# Patient Record
Sex: Female | Born: 1970 | Race: White | Hispanic: No | Smoking: Current every day smoker
Health system: Southern US, Community
[De-identification: ages and names within clinical notes are randomized; demographics above are authoritative.]

## PROBLEM LIST (undated history)

## (undated) DIAGNOSIS — J45909 Unspecified asthma, uncomplicated: Secondary | ICD-10-CM

## (undated) DIAGNOSIS — F419 Anxiety disorder, unspecified: Secondary | ICD-10-CM

## (undated) DIAGNOSIS — T7840XA Allergy, unspecified, initial encounter: Secondary | ICD-10-CM

## (undated) HISTORY — PX: BREAST ENHANCEMENT SURGERY: SHX7

## (undated) HISTORY — DX: Anxiety disorder, unspecified: F41.9

## (undated) HISTORY — PX: NECK SURGERY: SHX720

## (undated) HISTORY — DX: Unspecified asthma, uncomplicated: J45.909

## (undated) HISTORY — PX: ANKLE SURGERY: SHX546

## (undated) HISTORY — PX: TONSILLECTOMY: SUR1361

## (undated) HISTORY — DX: Allergy, unspecified, initial encounter: T78.40XA

---

## 1997-01-20 HISTORY — PX: ANKLE SURGERY: SHX546

## 1997-06-04 ENCOUNTER — Inpatient Hospital Stay (HOSPITAL_COMMUNITY): Admission: AD | Admit: 1997-06-04 | Discharge: 1997-06-04 | Payer: Self-pay | Admitting: Obstetrics and Gynecology

## 1997-06-08 ENCOUNTER — Inpatient Hospital Stay (HOSPITAL_COMMUNITY): Admission: AD | Admit: 1997-06-08 | Discharge: 1997-06-10 | Payer: Self-pay | Admitting: Obstetrics and Gynecology

## 1997-06-15 ENCOUNTER — Encounter: Admission: RE | Admit: 1997-06-15 | Discharge: 1997-09-13 | Payer: Self-pay | Admitting: Obstetrics and Gynecology

## 1997-07-27 ENCOUNTER — Other Ambulatory Visit: Admission: RE | Admit: 1997-07-27 | Discharge: 1997-07-27 | Payer: Self-pay | Admitting: Obstetrics and Gynecology

## 2001-10-12 ENCOUNTER — Encounter: Payer: Self-pay | Admitting: *Deleted

## 2001-10-12 ENCOUNTER — Ambulatory Visit (HOSPITAL_COMMUNITY): Admission: RE | Admit: 2001-10-12 | Discharge: 2001-10-12 | Payer: Self-pay | Admitting: Urology

## 2007-08-01 ENCOUNTER — Emergency Department: Payer: Self-pay | Admitting: Emergency Medicine

## 2007-08-01 ENCOUNTER — Other Ambulatory Visit: Payer: Self-pay
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
## 2009-01-20 HISTORY — PX: NECK SURGERY: SHX720

## 2010-04-22 ENCOUNTER — Emergency Department: Payer: Self-pay | Admitting: Internal Medicine
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
## 2010-04-22 IMAGING — CR DG KNEE COMPLETE 4+V*L*
1 series · 4 of 4 positions shown · non-contrast
Comparison: none

REASON FOR EXAM: fall
COMMENTS:   LMP: Now

PROCEDURE:     DXR - DXR KNEE LT COMP WITH OBLIQUES  - [DATE]  [DATE]
RESULT:     No fracture, dislocation or other acute bony abnormality is
identified. The knee joint space is well maintained. The patella is intact.

[Series 1: view not recorded · 0.17mm/px · 4 of 4 slices shown]
[im 1/4]
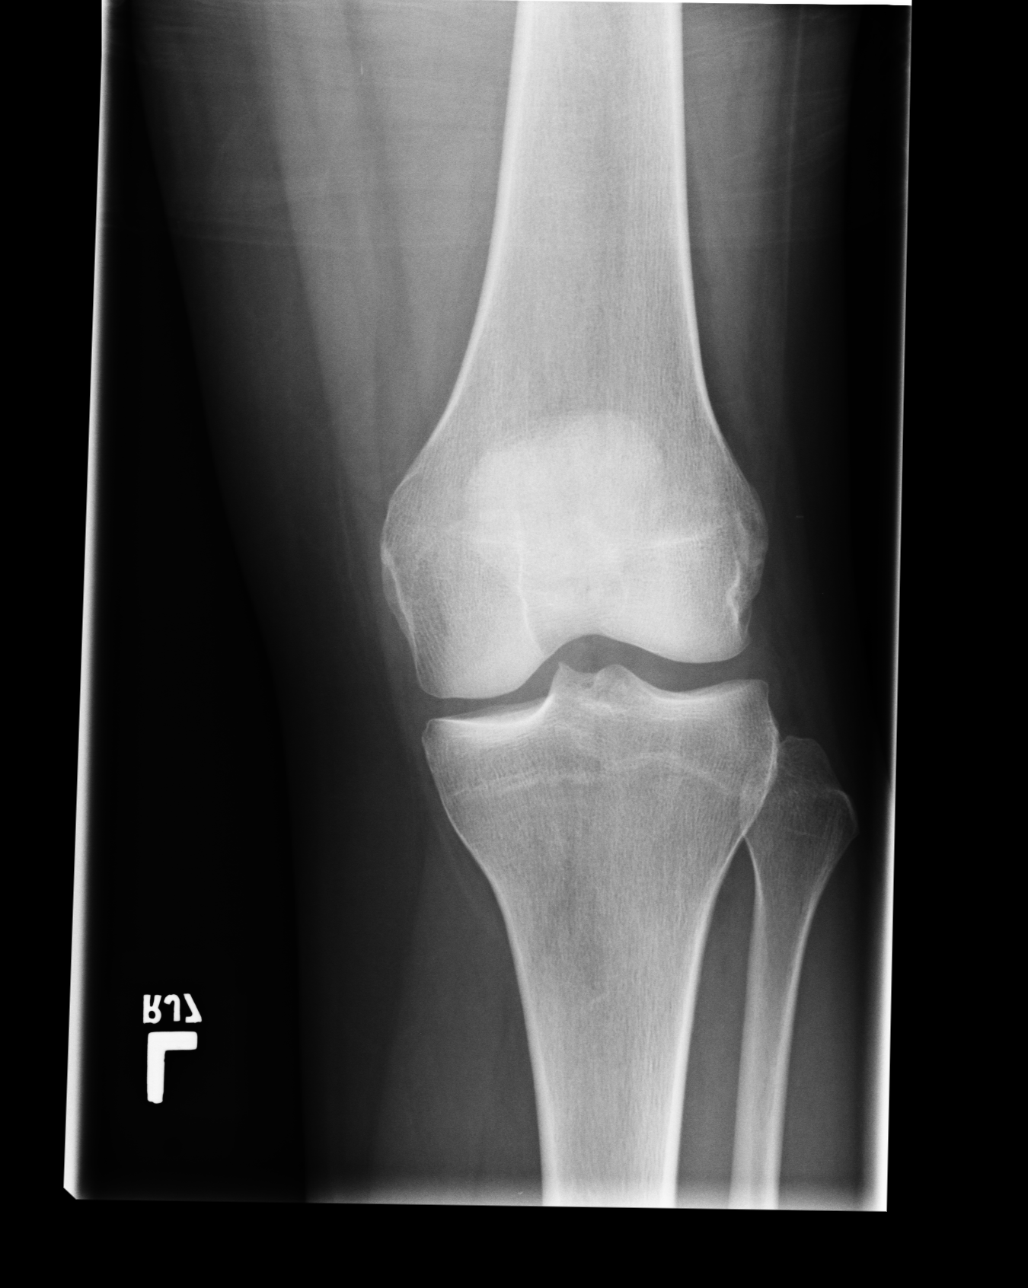
[im 2/4]
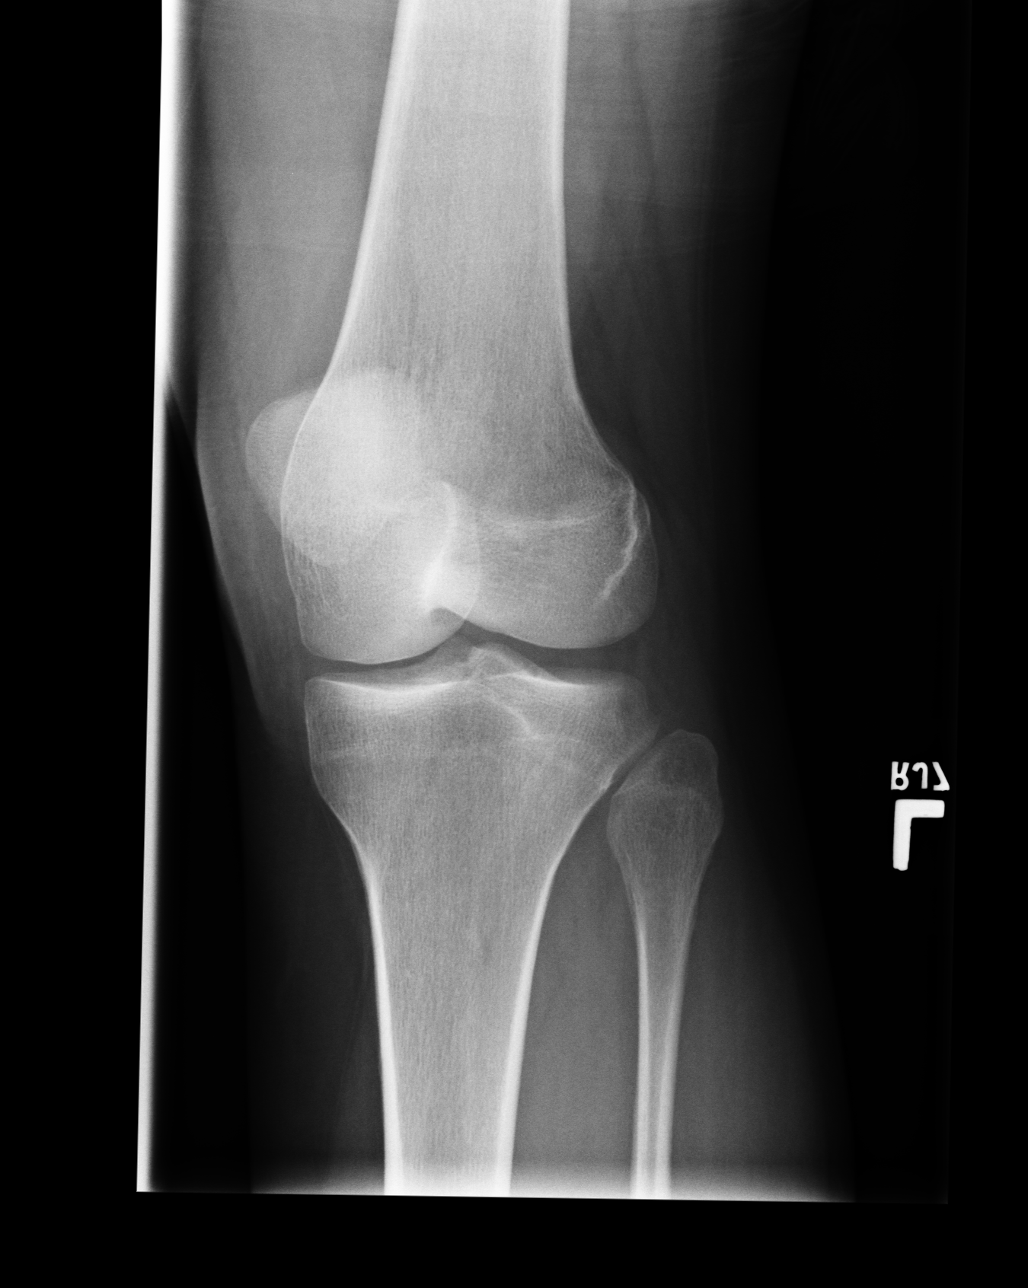
[im 3/4]
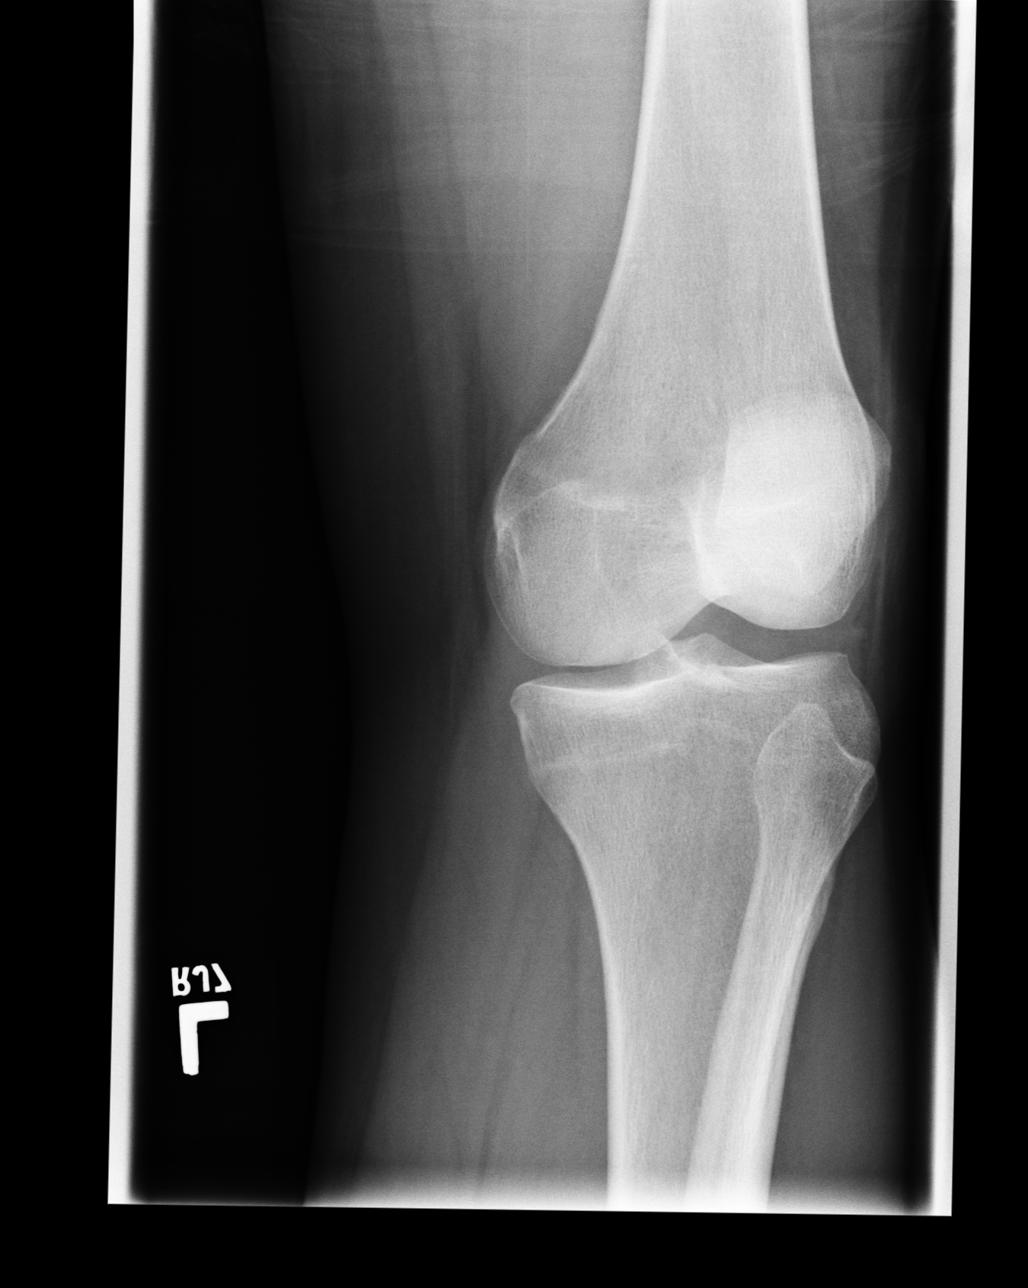
[im 4/4]
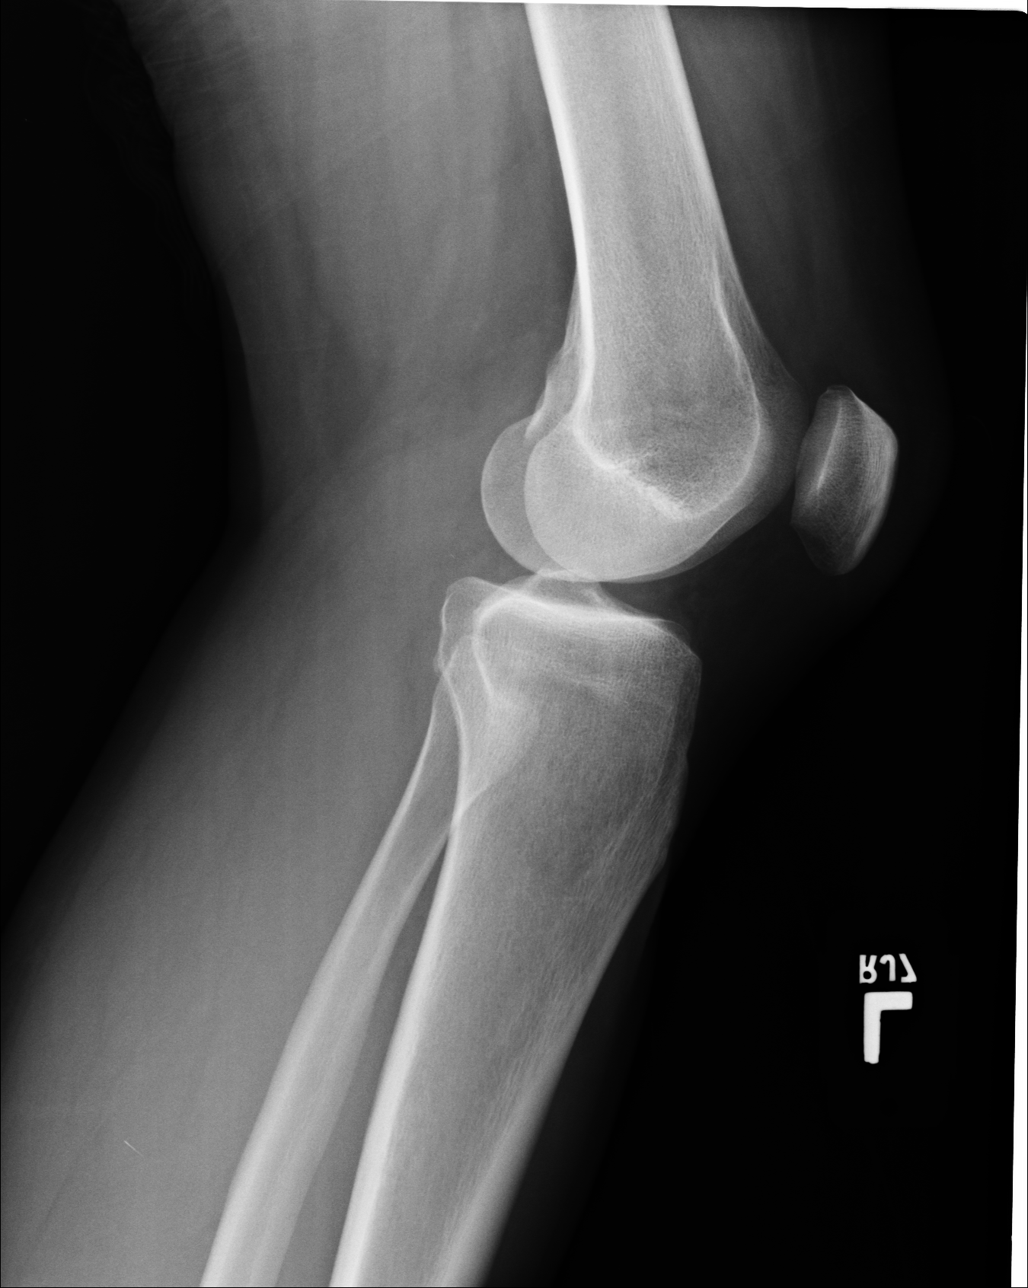

[4 of 4 positions shown; findings below may reference images not displayed]

IMPRESSION: No acute changes are identified.

## 2010-08-13 ENCOUNTER — Emergency Department: Payer: Self-pay | Admitting: Unknown Physician Specialty

## 2010-11-04 ENCOUNTER — Emergency Department: Payer: Self-pay | Admitting: Unknown Physician Specialty

## 2010-11-04 IMAGING — CR DG CHEST 2V
1 series · 2 of 2 positions shown · non-contrast
Comparison: none

REASON FOR EXAM: cough and wheezing
COMMENTS:

[Series 1: view not recorded · 0.17mm/px · 2 of 2 slices shown]
[im 1/2]
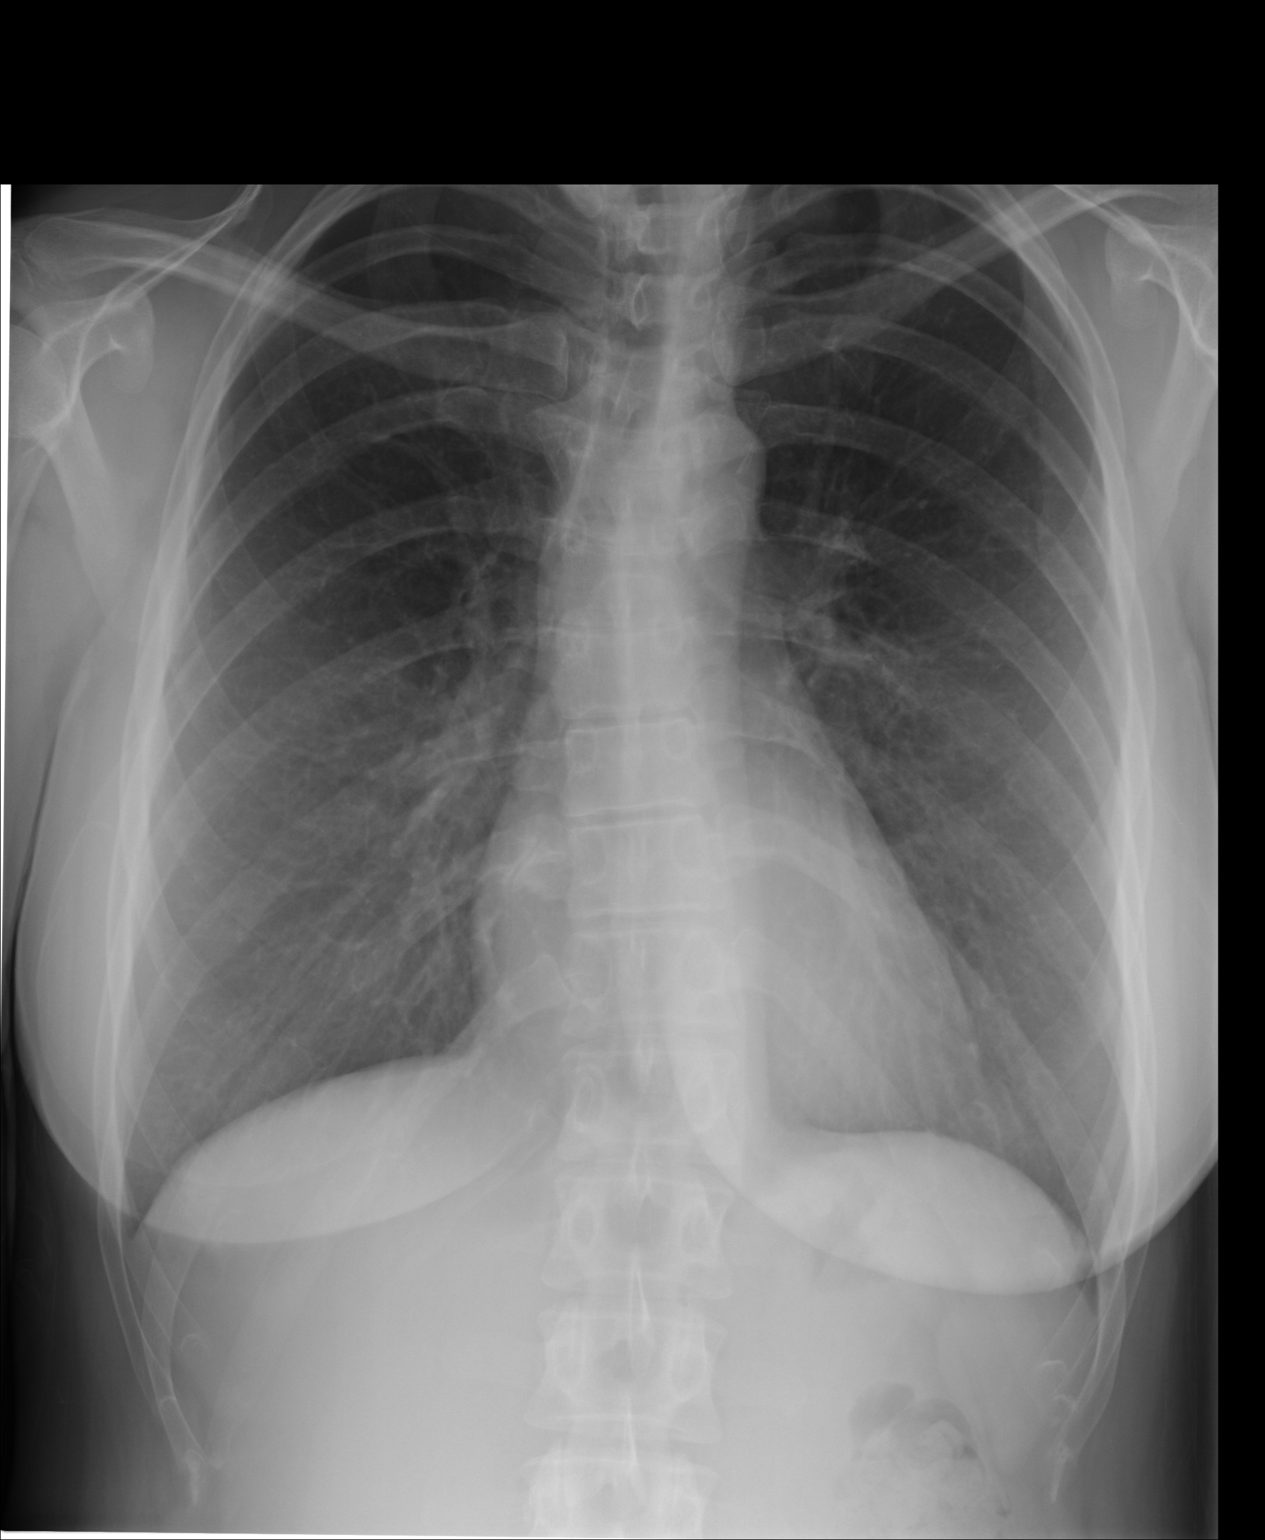
[im 2/2]
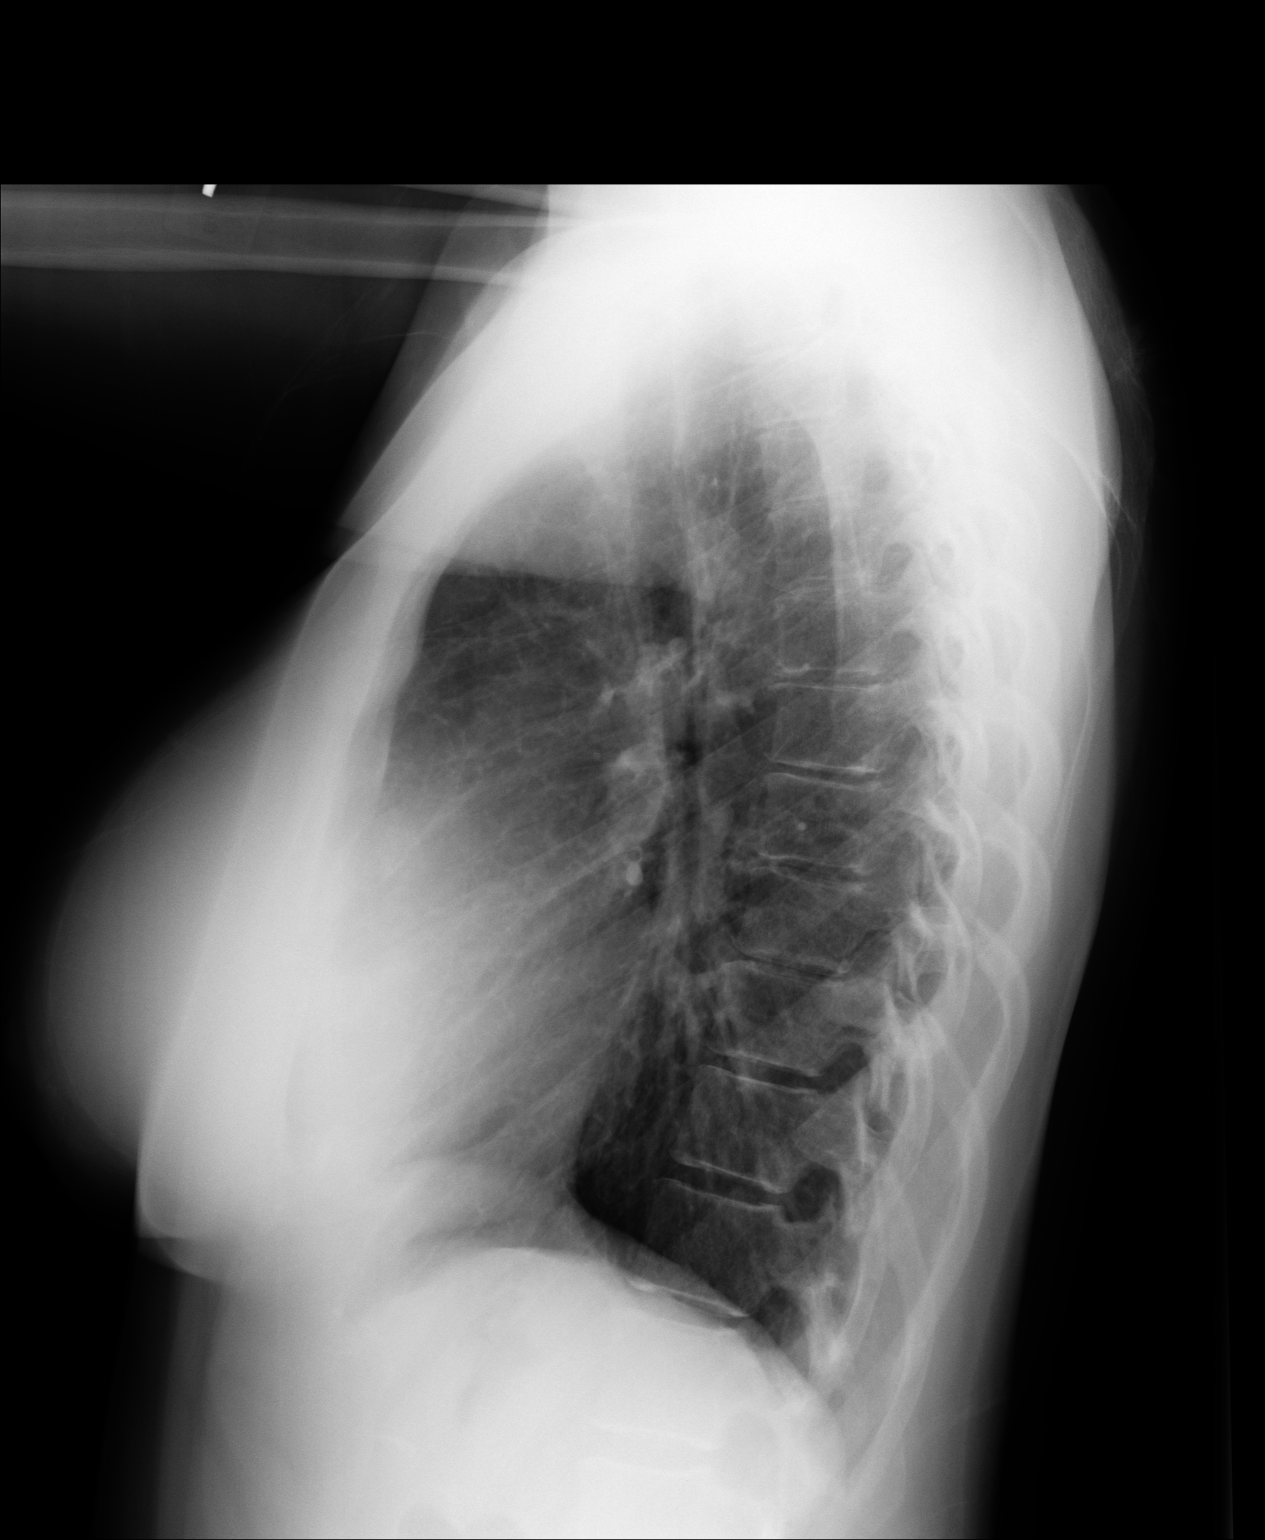

[2 of 2 positions shown; findings below may reference images not displayed]

PROCEDURE:     DXR - DXR CHEST PA (OR AP) AND LATERAL  - [DATE]  [DATE]

RESULT:     Comparison is made to the prior exam of [DATE]. The current
exam shows the lung fields to be clear. No pneumonia, pneumothorax or
pleural effusion is seen. Heart size is normal. Incidental note is made of a
slight thoracic scoliosis with a convexity to the left.
IMPRESSION: No acute changes are identified.

## 2011-02-14 ENCOUNTER — Emergency Department: Payer: Self-pay | Admitting: Internal Medicine

## 2011-02-14 LAB — CBC WITH DIFFERENTIAL/PLATELET
Basophil #: 0.3 10*3/uL — ABNORMAL HIGH (ref 0.0–0.1)
Lymphocyte #: 1.6 10*3/uL (ref 1.0–3.6)
MCV: 93 fL (ref 80–100)
Monocyte #: 0.4 10*3/uL (ref 0.0–0.7)
Monocyte %: 5.4 %
Platelet: 270 10*3/uL (ref 150–440)
RDW: 13.4 % (ref 11.5–14.5)
WBC: 7.2 10*3/uL (ref 3.6–11.0)

## 2011-02-14 LAB — URINALYSIS, COMPLETE
Glucose,UR: NEGATIVE mg/dL (ref 0–75)
Nitrite: NEGATIVE
Protein: NEGATIVE
RBC,UR: <1 /HPF (ref 0–5)
WBC UR: NONE SEEN /HPF (ref 0–5)

## 2011-02-14 LAB — COMPREHENSIVE METABOLIC PANEL
Alkaline Phosphatase: 44 U/L — ABNORMAL LOW (ref 50–136)
Calcium, Total: 8.8 mg/dL (ref 8.5–10.1)
Chloride: 106 mmol/L (ref 98–107)
Co2: 29 mmol/L (ref 21–32)
Creatinine: 0.76 mg/dL (ref 0.60–1.30)
EGFR (African American): >60
EGFR (Non-African Amer.): >60
SGOT(AST): 16 U/L (ref 15–37)
SGPT (ALT): 18 U/L

## 2011-02-14 LAB — WET PREP, GENITAL

## 2011-02-14 IMAGING — CR DG CHEST 2V
1 series · 2 of 2 positions shown · non-contrast
Comparison: none

REASON FOR EXAM: cough
COMMENTS:

[Series 1: w chest pa · 0.14mm/px · 2 of 2 slices shown]
[im 1/2]
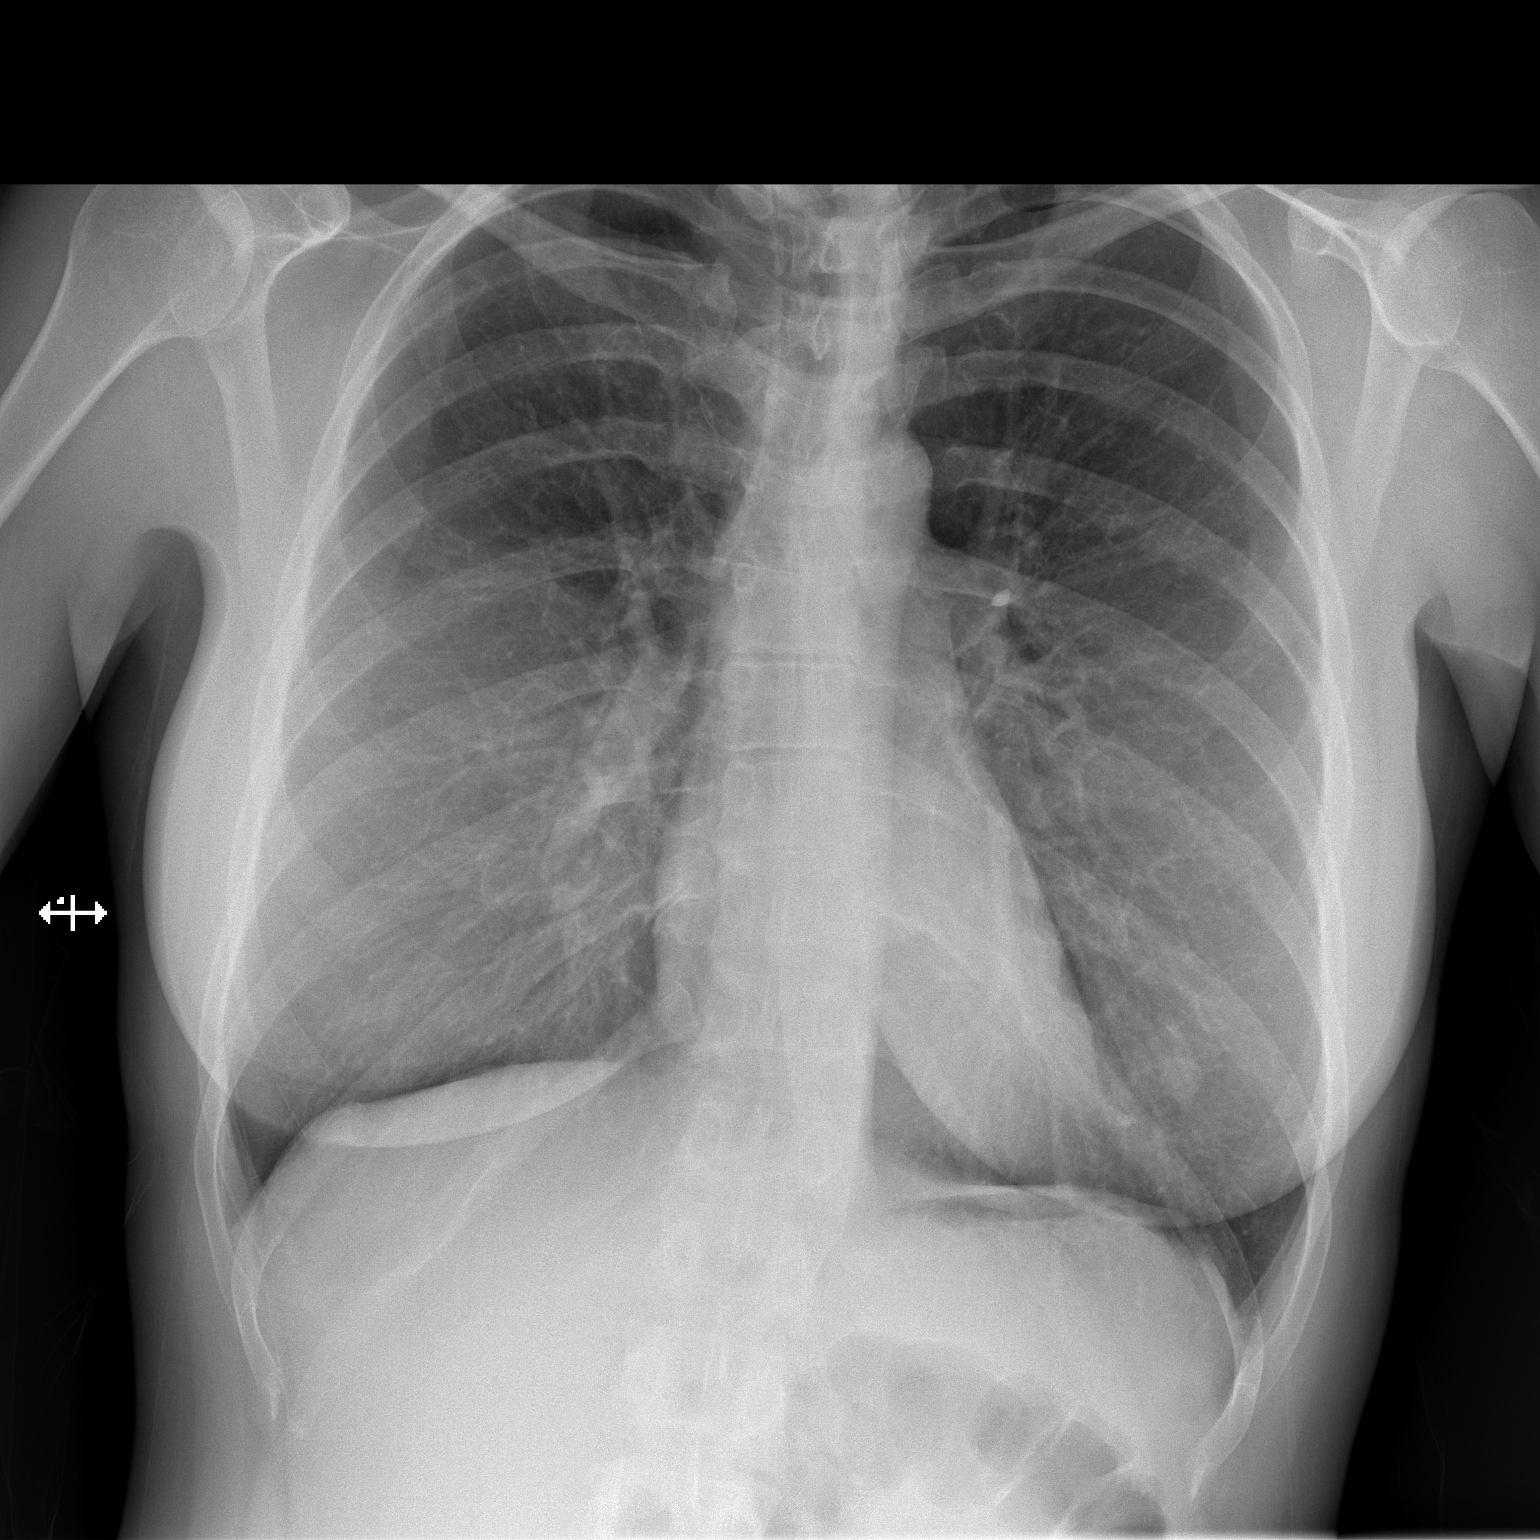
[im 2/2]
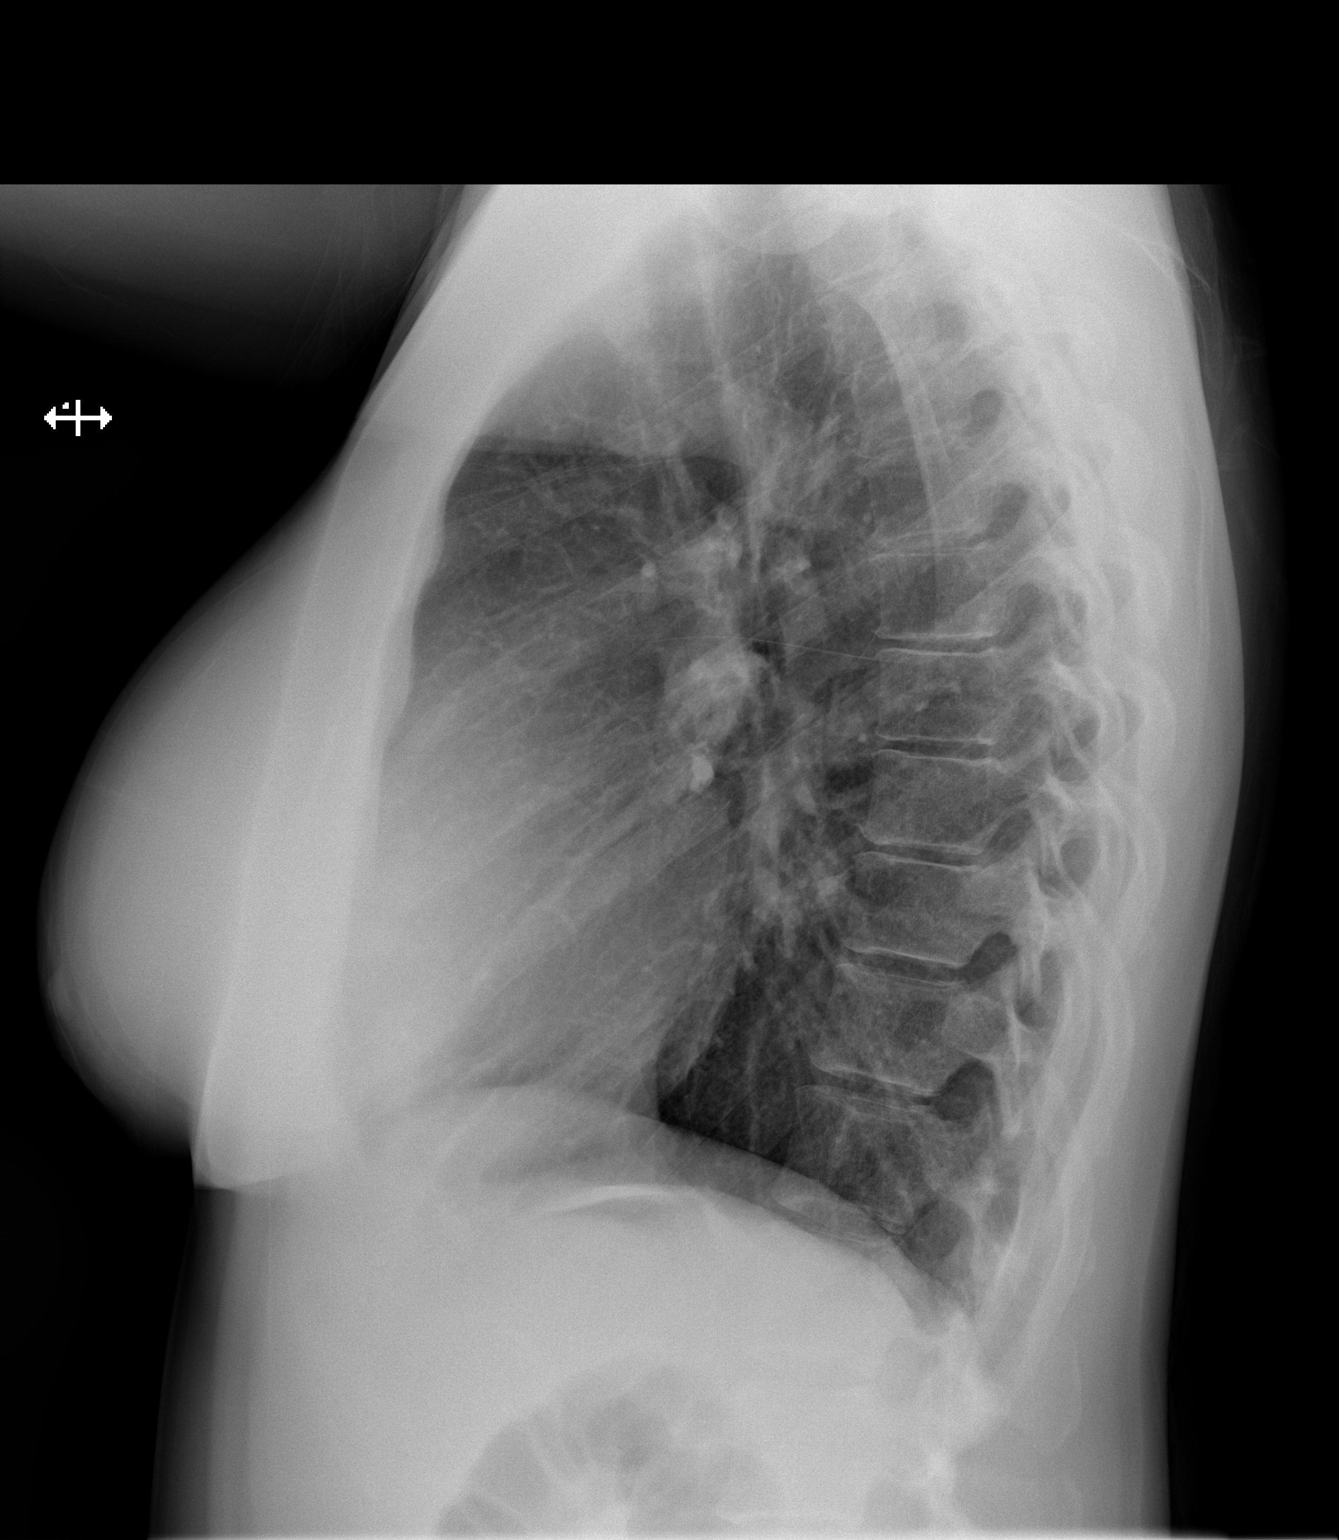

[2 of 2 positions shown; findings below may reference images not displayed]

PROCEDURE:     DXR - DXR CHEST PA (OR AP) AND LATERAL  - [DATE]  [DATE]

RESULT:     Comparison is made to the previous examination dated [DATE].

There is a nodular density inferiorly in the left lung which is likely a
prominent nipple shadow. Followup PA view with nipple markers is
recommended. The lungs are clear. The heart and pulmonary vessels are
normal. There is mild hyperinflation.
IMPRESSION: 1. No acute cardiopulmonary disease. Mild hyperinflation. Likely prominent
nipple shadow the left lung base. Followup PA view with nipple markers is
recommended.

## 2011-11-11 ENCOUNTER — Emergency Department: Payer: Self-pay | Admitting: Emergency Medicine

## 2011-11-11 LAB — COMPREHENSIVE METABOLIC PANEL
Albumin: 4 g/dL (ref 3.4–5.0)
Anion Gap: 11 (ref 7–16)
BUN: 7 mg/dL (ref 7–18)
Creatinine: 0.59 mg/dL — ABNORMAL LOW (ref 0.60–1.30)
Glucose: 104 mg/dL — ABNORMAL HIGH (ref 65–99)
Osmolality: 278 (ref 275–301)
Potassium: 3.6 mmol/L (ref 3.5–5.1)
Sodium: 140 mmol/L (ref 136–145)
Total Protein: 7.4 g/dL (ref 6.4–8.2)

## 2011-11-11 LAB — ETHANOL
Ethanol %: <0.003 % (ref 0.000–0.080)
Ethanol: <3 mg/dL

## 2011-11-11 LAB — TSH: Thyroid Stimulating Horm: 0.26 u[IU]/mL — ABNORMAL LOW

## 2011-11-11 LAB — CBC
HCT: 41.9 % (ref 35.0–47.0)
HGB: 14.1 g/dL (ref 12.0–16.0)
MCHC: 33.7 g/dL (ref 32.0–36.0)
RBC: 4.67 10*6/uL (ref 3.80–5.20)
WBC: 10.6 10*3/uL (ref 3.6–11.0)

## 2014-02-18 LAB — DRUG SCREEN, URINE
AMPHETAMINES, UR SCREEN: POSITIVE (ref ?–1000)
Barbiturates, Ur Screen: NEGATIVE (ref ?–200)
Benzodiazepine, Ur Scrn: POSITIVE (ref ?–200)
Cannabinoid 50 Ng, Ur ~~LOC~~: POSITIVE (ref ?–50)
Cocaine Metabolite,Ur ~~LOC~~: NEGATIVE (ref ?–300)
MDMA (Ecstasy)Ur Screen: NEGATIVE (ref ?–500)
METHADONE, UR SCREEN: NEGATIVE (ref ?–300)
OPIATE, UR SCREEN: NEGATIVE (ref ?–300)
Phencyclidine (PCP) Ur S: NEGATIVE (ref ?–25)
TRICYCLIC, UR SCREEN: NEGATIVE (ref ?–1000)

## 2014-02-18 LAB — URINALYSIS, COMPLETE
Bacteria: NONE SEEN
Bilirubin,UR: NEGATIVE
Blood: NEGATIVE
Glucose,UR: NEGATIVE mg/dL (ref 0–75)
Leukocyte Esterase: NEGATIVE
Nitrite: NEGATIVE
Ph: 5 (ref 4.5–8.0)
Protein: 100
SPECIFIC GRAVITY: 1.026 (ref 1.003–1.030)
Squamous Epithelial: 2
WBC UR: <1 /HPF (ref 0–5)

## 2014-02-18 LAB — COMPREHENSIVE METABOLIC PANEL
ALK PHOS: 52 U/L (ref 46–116)
AST: 19 U/L (ref 15–37)
Albumin: 4.1 g/dL (ref 3.4–5.0)
Anion Gap: 8 (ref 7–16)
BUN: 11 mg/dL (ref 7–18)
Bilirubin,Total: 0.2 mg/dL (ref 0.2–1.0)
CO2: 26 mmol/L (ref 21–32)
Calcium, Total: 9.2 mg/dL (ref 8.5–10.1)
Chloride: 106 mmol/L (ref 98–107)
Creatinine: 1.04 mg/dL (ref 0.60–1.30)
EGFR (African American): >60
GLUCOSE: 130 mg/dL — AB (ref 65–99)
OSMOLALITY: 281 (ref 275–301)
Potassium: 4.2 mmol/L (ref 3.5–5.1)
SGPT (ALT): 18 U/L (ref 14–63)
SODIUM: 140 mmol/L (ref 136–145)
Total Protein: 7.9 g/dL (ref 6.4–8.2)

## 2014-02-18 LAB — CBC
HCT: 46 % (ref 35.0–47.0)
HGB: 14.9 g/dL (ref 12.0–16.0)
MCH: 29.9 pg (ref 26.0–34.0)
MCHC: 32.4 g/dL (ref 32.0–36.0)
MCV: 92 fL (ref 80–100)
PLATELETS: 349 10*3/uL (ref 150–440)
RBC: 4.97 10*6/uL (ref 3.80–5.20)
RDW: 13.4 % (ref 11.5–14.5)
WBC: 13 10*3/uL — ABNORMAL HIGH (ref 3.6–11.0)

## 2014-02-18 LAB — ACETAMINOPHEN LEVEL: Acetaminophen: <2 ug/mL

## 2014-02-18 LAB — SALICYLATE LEVEL: Salicylates, Serum: 3.2 mg/dL — ABNORMAL HIGH

## 2014-02-18 LAB — TROPONIN I

## 2014-02-18 LAB — PREGNANCY, URINE: Pregnancy Test, Urine: NEGATIVE m[IU]/mL
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
## 2014-02-18 IMAGING — CR DG CHEST 1V PORT
1 series · 1 of 1 positions shown · non-contrast
Comparison: [DATE]

CLINICAL DATA: Altered mental status with apparent drug overdose

EXAM:
PORTABLE CHEST - 1 VIEW

[ap]
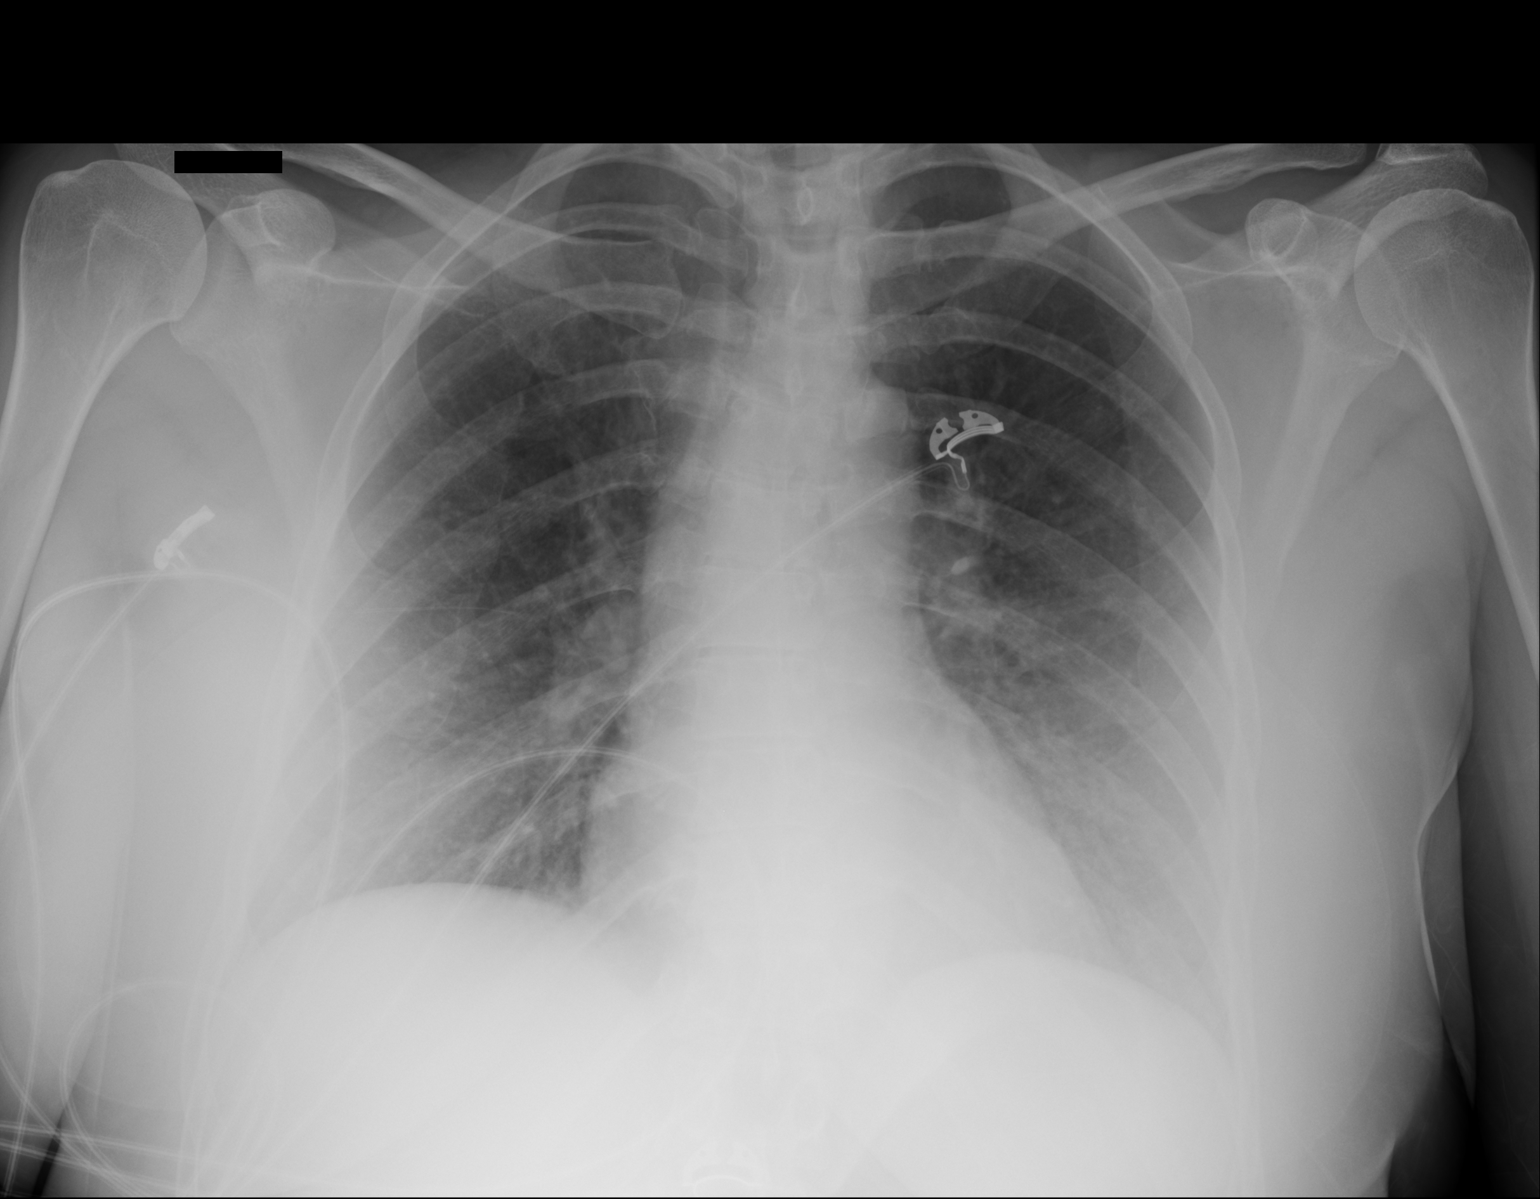

[1 of 1 positions shown; findings below may reference images not displayed]

FINDINGS: Lungs are clear. Heart size and pulmonary vascularity are normal. No
adenopathy. No bone lesions.
IMPRESSION: No edema or consolidation.

## 2014-02-19 LAB — ETHANOL

## 2014-02-20 ENCOUNTER — Inpatient Hospital Stay: Payer: Self-pay | Admitting: Psychiatry

## 2014-05-21 NOTE — Consult Note (Signed)
Brief Consult Note: Diagnosis: Bipolar Do, Polysubs Dep.   Patient was seen by consultant.   Recommend further assessment or treatment.   Orders entered.   Comments: Pt seen in ED [facility]. She presented due to extensive use of drugs while having issues with family. Her UDs was postive with Benzo, Amphetamine and she was having hallucination, taking off her clothes and having bizzare behavior. She received high doses of Lorazepam and Haldol which helped her. She stated that she wants to call her mother this morning. She will be admitted for stabilization and safety.  Electronic Signatures: Rhunette CroftFaheem, Kort Stettler S (MD)  (Signed 01-Feb-16 12:11)  Authored: Brief Consult Note   Last Updated: 01-Feb-16 12:11 by Rhunette CroftFaheem, Steen Bisig S (MD)

## 2014-05-21 NOTE — H&P (Signed)
PATIENT NAME:  Gwendolyn Hansen, Gwendolyn Hansen MR#:  [id number] DATE OF BIRTH:  [birth year]  DATE OF ADMISSION:  02/20/2014  CC: "I was acting crazy"  IDENTIFYING INFORMATION: A 43, single, unemployed Caucasian female with history of substance abuse who presented to our Emergency Department on January 30. Per intake note the patient was brought in by family as she was having erratic behaviors for the last 2 days. The mother reported that the patient was screaming, appeared confused and disoriented. She went into the house and start rubbing the inside of her children's mouths with Q-Tips. After that she took her shirt off and started walking around the house. The mother reported that she had a similar episode about 3 years ago, but she was not sure as to what caused it.  This patient has a history of drug and alcohol use. Her mother reported that she knows the patient takes pain pills, cocaine, alcohol, and marijuana. At arrival her urine toxicology was positive for benzodiazepines, cannabis, and amphetamines. At that time the patient was agitated that she had to receive injectable medications in order to address agitation.  Her CT that was within normal limits. All of her laboratory results were normal with the exception of having the positive urine toxicology screen. Today the patient was interviewed. She states that she is here because she was "acting stupid" and thinking that there was a curse that she was trying to remove. The patient herself called 911 as a result of this. She believes she might be undergoing withdrawal as she had been using opiates. This patient was a limited historian. She is tangential and provides unreliable information. She denied today having suicidality, homicidality, or auditory or visual hallucinations. She stated that she has been sleeping well, appetite, energy, and concentration are fair. She does complain of having diarrhea, abdominal pain, headache, cough, and nausea. The patient states that  she has been seeing [facility] who is prescribing her with Subutex, Adderall, Remeron, and diazepam. The patient said that lately she has been using "pain pills" and alcohol.     SUBSTANCE ABUSE:  The patient does report a history of abusing opiates and had severe issues with alcoholism since the age of 44-13. She stated that she was sober for a while, but started drinking recently again. The patient smokes about 1 pack of cigarettes a day. Also reports trying crack cocaine, cannabis recently.   PAST PSYCHIATRIC HISTORY: The patient reports received substance abuse treatment at [facility] in [place].  Denies any history of suicidal attempts.   PAST MEDICAL HISTORY:  Noncontributory.  Denies history of seizures or head trauma.  FAMILY HISTORY: The patient reports her father was an alcoholic and her sister has issues with substance abuse.   SOCIAL HISTORY: The patient currently lives with her boyfriend and stays sometimes with her mother. She has been helping taking care of her father who is in dialysis. 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 The patient denies any legal charges. No other family history is known.   MENTAL STATUS EXAMINATION: The patient is a 44 year old Caucasian female who appears confused, but is oriented in person, place, time, and situation. Attention and concentration, she was able to tell me the months of the year backwards without much difficulty. She did have to think a few times, but was able to complete them. Behavior, she was cooperative. Speech had regular tone, volume, and rate. Thought process is circumstantial and  required frequent redirection. Thought content negative for suicidality, homicidality, or psychosis. Mood is euthymic. Affect is reactive. Insight and judgment are impaired.     REVIEW OF SYSTEMS: Positive for abdominal pain, headache, cough, and diarrhea. The rest of the 10 review of  systems is negative.   PHYSICAL EXAMINATION:  VITAL SIGNS: Blood pressure 127/85, respirations 20, heart rate 87, temperature 98.6.  GENERAL: The patient is a 44 year old Caucasian female in no acute distress.  MUSCULOSKELETAL: Normal gait, normal muscular tone, and no evidence of involuntary movements.   LABORATORY RESULTS: Urine pregnancy test is negative. EKG shows a QTc of 442. Salicylate level is 3.2. Acetaminophen level is less than 2. UA is clear. WBC is within normal limits. Urine toxicology screen is positive for cannabis, benzodiazepines, and amphetamines.  Troponins were less than 0.02. AST 18, ALT 19. Alcohol was below detection limit. Sodium 140, potassium 4.2, calcium 9.2. CT scan of the head was negative. Chest x-ray was within normal limits.   DIAGNOSES:  1. Substance induced delirium versus unknown substance-induced psychosis.  2. Opiate use disorder.  3. Alcohol use disorder.  4. Amphetamine use disorder.  5. Cocaine use disorder.  6. Cannabis use disorder.  7. Tobacco use disorder, severe.   ASSESSMENT: A 44 year old admitted for erratic and bizarre behavior with a 2 day onset. Urine toxicology is positive for a multitude of substances, the rest of the laboratory results and CT scan were negative. Most likely current status induced by substances.   1.  For delirium versus psychosis she will be continued on haloperidol. I will increase the dose to 1 mg p.o. b.i.d.  2.  For insomnia she will be continued on trazodone p.o. at bedtime p.r.n.  3.  For opiate withdrawal she will be continued on imodium, nonsteroidal anti-inflammatory drugs, and Phenergan prn. 4.  For alcohol use withdrawal, no evidence of withdrawal is noted and vital signs are normal at this point in time, therefore I will discontinue Librium.  5.  For tobacco use disorder she will receive a Nicotrol inhaler.  6.  Diet.  I will order a regular diet.  7.  Precautions. She will be continued on seizure  precautions for now. 8.  Hospitalization status. She will be made a voluntary patient today. The patient will be asked to sign in voluntarily.     ____________________________ Jimmy Footman, MD ahg:bu D: 02/21/2014 15:25:00 ET T: 02/21/2014 15:41:32 ET JOB#: [id number]  cc: Jimmy Footman, MD, <Dictator> Horton Chin MD ELECTRONICALLY SIGNED 02/21/2014 19:48

## 2014-05-21 NOTE — Consult Note (Signed)
PATIENT NAME:  Gwendolyn Hansen, Enyla G MR#:  [id number] DATE OF BIRTH:  [birth year]  DATE OF CONSULTATION:  02/19/2014  PLACE [facility] DICTATION: [facility] [place].  AGE: 44 years.  SEX: Female.  RACE: White.  SUBJECTIVE: The patient was [facility] in [facility]. 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  The patient is divorced for 5 years and is not sure about the same. According to the staff, the patient was married to a [place] man for 20 years.  The patient is a very poor historian and appears to be preoccupied.  The patient reports that she is going between her parents and her boyfriend and sister's place. The patient reports that her sister got concerned about her and brought her here. The patient has been screaming and yelling and disrobing herself and exposing herself in front of other patients.  She has been having bizarre behavior and walking around and probably preoccupied.  PAST PSYCHIATRIC HISTORY: The patient reports that she was referred to [facility] on one occasion for polysubstance abuse and she did not complete the program and she left early. In  addition, she reports that she is being [facility] and also [facility] by the undersigned. The patient reports that she took too many medications before her admission here, which includes Adderall, Remeron, and Valium. The patient reports that she took unknown amounts of the same.  In addition, she has been smoking THC.  MENTAL STATUS: The patient is very disheveled in appearance with hair not combed.  Very poor grooming. Appears that she is staring and dazed.  Not able to focus.  Not able to pay attention.  Not able to get enough information from her.  She could give her name and her age.  She did not know where she was and barely she could say this was 2016 and did not know the month.  She is paranoid and  suspicious and probably responding to internal stimuli.  She repeatedly stated that she had been to [place] with her boyfriend or husband and  a child and she is not sure about the same.  She is walking around the floor and very intrusive behavior and had been hugging other patients and has to be constantly redirected.  Insight and judgment impaired.  Impulse control is poor.  IMPRESSION:  Substance abuse - prescription and street drug abuse.  Opioid addiction/dependence as abuse.  Prescription drug abuse - Adderall, Valium, and Remeron.    RECOMMENDATIONS: Observation of the patient with p.r.n. medications to help her calm down and then re-evaluate the patient for appropriate disposition.  ____________________________ Jannet MantisSurya K. Guss Bunde[place]alla, MD skc:ap D: 02/19/2014 15:21:50 ET T: 02/19/2014 17:22:41 ET JOB#: [id number]  cc: Monika SalkSurya K. Guss [place]unde[place], MD, <Dictator> Beau FannySURYA K Arliss Hepburn MD ELECTRONICALLY SIGNED 02/21/2014 6:59

## 2014-08-02 ENCOUNTER — Telehealth: Payer: Self-pay | Admitting: Family Medicine

## 2014-08-02 NOTE — Telephone Encounter (Signed)
Okay 

## 2014-08-02 NOTE — Telephone Encounter (Signed)
Patient wants to know if you take her back as a new patient, she was going [facility] but wants to come back this of way. Her mom Dalbert BatmanSandra Highfill is a patient of yours Cb# [phone]

## 2014-08-03 NOTE — Telephone Encounter (Signed)
Per Lowella BandyNikki pt can be seen in September.  Called pt, left message/MJ

## 2014-08-09 NOTE — Telephone Encounter (Signed)
Pt request an appointment with Antony ContrasJenni per wanting to see a women doctor.  I scheduled a new patient appt with Antony ContrasJenni for 08/17/14@1 :30/MW

## 2014-08-15 ENCOUNTER — Other Ambulatory Visit: Payer: Self-pay

## 2014-08-17 ENCOUNTER — Encounter: Payer: Self-pay | Admitting: Physician Assistant

## 2014-08-17 ENCOUNTER — Ambulatory Visit (INDEPENDENT_AMBULATORY_CARE_PROVIDER_SITE_OTHER): Payer: Self-pay | Admitting: Physician Assistant

## 2014-08-17 VITALS — BP 122/78 | HR 74 | Temp 98.7°F | Resp 18 | Ht 69.5 in | Wt 186.2 lb

## 2014-08-17 DIAGNOSIS — Z124 Encounter for screening for malignant neoplasm of cervix: Secondary | ICD-10-CM

## 2014-08-17 DIAGNOSIS — F1021 Alcohol dependence, in remission: Secondary | ICD-10-CM | POA: Insufficient documentation

## 2014-08-17 DIAGNOSIS — Z1239 Encounter for other screening for malignant neoplasm of breast: Secondary | ICD-10-CM

## 2014-08-17 DIAGNOSIS — Z720 Tobacco use: Secondary | ICD-10-CM

## 2014-08-17 DIAGNOSIS — F32A Depression, unspecified: Secondary | ICD-10-CM

## 2014-08-17 DIAGNOSIS — F329 Major depressive disorder, single episode, unspecified: Secondary | ICD-10-CM

## 2014-08-17 DIAGNOSIS — F1120 Opioid dependence, uncomplicated: Secondary | ICD-10-CM | POA: Insufficient documentation

## 2014-08-17 DIAGNOSIS — Z0000 Encounter for general adult medical examination without abnormal findings: Secondary | ICD-10-CM

## 2014-08-17 DIAGNOSIS — F17200 Nicotine dependence, unspecified, uncomplicated: Secondary | ICD-10-CM

## 2014-08-17 DIAGNOSIS — M542 Cervicalgia: Secondary | ICD-10-CM | POA: Insufficient documentation

## 2014-08-17 DIAGNOSIS — F411 Generalized anxiety disorder: Secondary | ICD-10-CM | POA: Insufficient documentation

## 2014-08-17 DIAGNOSIS — M25579 Pain in unspecified ankle and joints of unspecified foot: Secondary | ICD-10-CM | POA: Insufficient documentation

## 2014-08-17 DIAGNOSIS — F1121 Opioid dependence, in remission: Secondary | ICD-10-CM

## 2014-08-17 NOTE — Patient Instructions (Signed)
Health Maintenance Adopting a healthy lifestyle and getting preventive care can go a long way to promote health and wellness. Talk with your health care provider about what schedule of regular examinations is right for you. This is a good chance for you to check in with your provider about disease prevention and staying healthy. In between checkups, there are plenty of things you can do on your own. Experts have done a lot of research about which lifestyle changes and preventive measures are most likely to keep you healthy. Ask your health care provider for more information. WEIGHT AND DIET  Eat a healthy diet 1. Be sure to include plenty of vegetables, fruits, low-fat dairy products, and lean protein. 2. Do not eat a lot of foods high in solid fats, added sugars, or salt. 3. Get regular exercise. This is one of the most important things you can do for your health. 1. Most adults should exercise for at least 150 minutes each week. The exercise should increase your heart rate and make you sweat (moderate-intensity exercise). 2. Most adults should also do strengthening exercises at least twice a week. This is in addition to the moderate-intensity exercise.  Maintain a healthy weight 1. Body mass index (BMI) is a measurement that can be used to identify possible weight problems. It estimates body fat based on height and weight. Your health care provider can help determine your BMI and help you achieve or maintain a healthy weight. 2. For females 20 years of age and older:  1. A BMI below 18.5 is considered underweight. 2. A BMI of 18.5 to 24.9 is normal. 3. A BMI of 25 to 29.9 is considered overweight. 4. A BMI of 30 and above is considered obese.  Watch levels of cholesterol and blood lipids 1. You should start having your blood tested for lipids and cholesterol at 46 years of age, then have this test every 5 years. 2. You may need to have your cholesterol levels checked more often if: 1. Your  lipid or cholesterol levels are high. 2. You are older than 46 years of age. 3. You are at high risk for heart disease.  CANCER SCREENING   Lung Cancer 1. Lung cancer screening is recommended for adults 55-80 years old who are at high risk for lung cancer because of a history of smoking. 2. A yearly low-dose CT scan of the lungs is recommended for people who: 1. Currently smoke. 2. Have quit within the past 15 years. 3. Have at least a 30-pack-year history of smoking. A pack year is smoking an average of one pack of cigarettes a day for 1 year. 3. Yearly screening should continue until it has been 15 years since you quit. 4. Yearly screening should stop if you develop a health problem that would prevent you from having lung cancer treatment.  Breast Cancer  Practice breast self-awareness. This means understanding how your breasts normally appear and feel.  It also means doing regular breast self-exams. Let your health care provider know about any changes, no matter how small.  If you are in your 20s or 30s, you should have a clinical breast exam (CBE) by a health care provider every 1-3 years as part of a regular health exam.  If you are 40 or older, have a CBE every year. Also consider having a breast X-ray (mammogram) every year.  If you have a family history of breast cancer, talk to your health care provider about genetic screening.  If you are   at high risk for breast cancer, talk to your health care provider about having an MRI and a mammogram every year.  Breast cancer gene (BRCA) assessment is recommended for women who have family members with BRCA-related cancers. BRCA-related cancers include:  Breast.  Ovarian.  Tubal.  Peritoneal cancers.  Results of the assessment will determine the need for genetic counseling and BRCA1 and BRCA2 testing. Cervical Cancer Routine pelvic examinations to screen for cervical cancer are no longer recommended for nonpregnant women who  are considered low risk for cancer of the pelvic organs (ovaries, uterus, and vagina) and who do not have symptoms. A pelvic examination may be necessary if you have symptoms including those associated with pelvic infections. Ask your health care provider if a screening pelvic exam is right for you.   The Pap test is the screening test for cervical cancer for women who are considered at risk.  If you had a hysterectomy for a problem that was not cancer or a condition that could lead to cancer, then you no longer need Pap tests.  If you are older than 65 years, and you have had normal Pap tests for the past 10 years, you no longer need to have Pap tests.  If you have had past treatment for cervical cancer or a condition that could lead to cancer, you need Pap tests and screening for cancer for at least 20 years after your treatment.  If you no longer get a Pap test, assess your risk factors if they change (such as having a new sexual partner). This can affect whether you should start being screened again.  Some women have medical problems that increase their chance of getting cervical cancer. If this is the case for you, your health care provider may recommend more frequent screening and Pap tests.  The human papillomavirus (HPV) test is another test that may be used for cervical cancer screening. The HPV test looks for the virus that can cause cell changes in the cervix. The cells collected during the Pap test can be tested for HPV.  The HPV test can be used to screen women 2 years of age and older. Getting tested for HPV can extend the interval between normal Pap tests from three to five years.  An HPV test also should be used to screen women of any age who have unclear Pap test results.  After 46 years of age, women should have HPV testing as often as Pap tests.  Colorectal Cancer  This type of cancer can be detected and often prevented.  Routine colorectal cancer screening usually  begins at 46 years of age and continues through 46 years of age.  Your health care provider may recommend screening at an earlier age if you have risk factors for colon cancer.  Your health care provider may also recommend using home test kits to check for hidden blood in the stool.  A small camera at the end of a tube can be used to examine your colon directly (sigmoidoscopy or colonoscopy). This is done to check for the earliest forms of colorectal cancer.  Routine screening usually begins at age 57.  Direct examination of the colon should be repeated every 5-10 years through 46 years of age. However, you may need to be screened more often if early forms of precancerous polyps or small growths are found. Skin Cancer  Check your skin from head to toe regularly.  Tell your health care provider about any new moles or changes in  moles, especially if there is a change in a mole's shape or color.  Also tell your health care provider if you have a mole that is larger than the size of a pencil eraser.  Always use sunscreen. Apply sunscreen liberally and repeatedly throughout the day.  Protect yourself by wearing long sleeves, pants, a wide-brimmed hat, and sunglasses whenever you are outside. HEART DISEASE, DIABETES, AND HIGH BLOOD PRESSURE   Have your blood pressure checked at least every 1-2 years. High blood pressure causes heart disease and increases the risk of stroke.  If you are between 32 years and 30 years old, ask your health care provider if you should take aspirin to prevent strokes.  Have regular diabetes screenings. This involves taking a blood sample to check your fasting blood sugar level.  If you are at a normal weight and have a low risk for diabetes, have this test once every three years after 46 years of age.  If you are overweight and have a high risk for diabetes, consider being tested at a younger age or more often. PREVENTING INFECTION  Hepatitis B  If you have a  higher risk for hepatitis B, you should be screened for this virus. You are considered at high risk for hepatitis B if:  You were born in a country where hepatitis B is common. Ask your health care provider which countries are considered high risk.  Your parents were born in a high-risk country, and you have not been immunized against hepatitis B (hepatitis B vaccine).  You have HIV or AIDS.  You use needles to inject street drugs.  You live with someone who has hepatitis B.  You have had sex with someone who has hepatitis B.  You get hemodialysis treatment.  You take certain medicines for conditions, including cancer, organ transplantation, and autoimmune conditions. Hepatitis C  Blood testing is recommended for:  Everyone born from [birth year] through 1967.  Anyone with known risk factors for hepatitis C. Sexually transmitted infections (STIs)  You should be screened for sexually transmitted infections (STIs) including gonorrhea and chlamydia if:  You are sexually active and are younger than 47 years of age.  You are older than 46 years of age and your health care provider tells you that you are at risk for this type of infection.  Your sexual activity has changed since you were last screened and you are at an increased risk for chlamydia or gonorrhea. Ask your health care provider if you are at risk.  If you do not have HIV, but are at risk, it may be recommended that you take a prescription medicine daily to prevent HIV infection. This is called pre-exposure prophylaxis (PrEP). You are considered at risk if:  You are sexually active and do not regularly use condoms or know the HIV status of your partner(s).  You take drugs by injection.  You are sexually active with a partner who has HIV. Talk with your health care provider about whether you are at high risk of being infected with HIV. If you choose to begin PrEP, you should first be tested for HIV. You should then be tested  every 3 months for as long as you are taking PrEP.  PREGNANCY   If you are premenopausal and you may become pregnant, ask your health care provider about preconception counseling.  If you may become pregnant, take 400 to 800 micrograms (mcg) of folic acid every day.  If you want to prevent pregnancy, talk to your  health care provider about birth control (contraception). OSTEOPOROSIS AND MENOPAUSE   Osteoporosis is a disease in which the bones lose minerals and strength with aging. This can result in serious bone fractures. Your risk for osteoporosis can be identified using a bone density scan.  If you are 34 years of age or older, or if you are at risk for osteoporosis and fractures, ask your health care provider if you should be screened.  Ask your health care provider whether you should take a calcium or vitamin D supplement to lower your risk for osteoporosis.  Menopause may have certain physical symptoms and risks.  Hormone replacement therapy may reduce some of these symptoms and risks. Talk to your health care provider about whether hormone replacement therapy is right for you.  HOME CARE INSTRUCTIONS   Schedule regular health, dental, and eye exams.  Stay current with your immunizations.   Do not use any tobacco products including cigarettes, chewing tobacco, or electronic cigarettes.  If you are pregnant, do not drink alcohol.  If you are breastfeeding, limit how much and how often you drink alcohol.  Limit alcohol intake to no more than 1 drink per day for nonpregnant women. One drink equals 12 ounces of beer, 5 ounces of wine, or 1 ounces of hard liquor.  Do not use street drugs.  Do not share needles.  Ask your health care provider for help if you need support or information about quitting drugs.  Tell your health care provider if you often feel depressed.  Tell your health care provider if you have ever been abused or do not feel safe at home. Document  Released: 07/22/2010 Document Revised: 05/23/2013 Document Reviewed: 12/08/2012 [facility] Patient Information 2015 [place], [place]. This information is not intended to replace advice given to you by your health care provider. Make sure you discuss any questions you have with your health care provider.     Why follow it? Research shows. . Those who follow the Mediterranean diet have a reduced risk of heart disease  . The diet is associated with a reduced incidence of Parkinson's and Alzheimer's diseases . People following the diet may have longer life expectancies and lower rates of chronic diseases  . The Dietary Guidelines for Americans recommends the Mediterranean diet as an eating plan to promote health and prevent disease  What Is the Mediterranean Diet?  . Healthy eating plan based on typical foods and recipes of Mediterranean-style cooking . The diet is primarily a plant based diet; these foods should make up a majority of meals   Starches - Plant based foods should make up a majority of meals - They are an important sources of vitamins, minerals, energy, antioxidants, and fiber - Choose whole grains, foods high in fiber and minimally processed items  - Typical grain sources include wheat, oats, barley, corn, brown rice, bulgar, farro, millet, polenta, couscous  - Various types of beans include chickpeas, lentils, fava beans, black beans, white beans   Fruits  Veggies - Large quantities of antioxidant rich fruits & veggies; 6 or more servings  - Vegetables can be eaten raw or lightly drizzled with oil and cooked  - Vegetables common to the traditional Mediterranean Diet include: artichokes, arugula, beets, broccoli, brussel sprouts, cabbage, carrots, celery, collard greens, cucumbers, eggplant, kale, leeks, lemons, lettuce, mushrooms, okra, onions, peas, peppers, potatoes, pumpkin, radishes, rutabaga, shallots, spinach, sweet potatoes, turnips, zucchini - Fruits common to the Mediterranean  Diet include: apples, apricots, avocados, cherries, clementines, dates, figs, grapefruits,  grapes, melons, nectarines, oranges, peaches, pears, pomegranates, strawberries, tangerines  Fats - Replace butter and margarine with healthy oils, such as olive oil, canola oil, and tahini  - Limit nuts to no more than a handful a day  - Nuts include walnuts, almonds, pecans, pistachios, pine nuts  - Limit or avoid candied, honey roasted or heavily salted nuts - Olives are central to the Mediterranean diet - can be eaten whole or used in a variety of dishes   Meats Protein - Limiting red meat: no more than a few times a month - When eating red meat: choose lean cuts and keep the portion to the size of deck of cards - Eggs: approx. 0 to 4 times a week  - Fish and lean poultry: at least 2 a week  - Healthy protein sources include, chicken, [place], lean beef, lamb - Increase intake of seafood such as tuna, salmon, trout, mackerel, shrimp, scallops - Avoid or limit high fat processed meats such as sausage and bacon  Dairy - Include moderate amounts of low fat dairy products  - Focus on healthy dairy such as fat free yogurt, skim milk, low or reduced fat cheese - Limit dairy products higher in fat such as whole or 2% milk, cheese, ice cream  Alcohol - Moderate amounts of red wine is ok  - No more than 5 oz daily for women (all ages) and men older than age 74  - No more than 10 oz of wine daily for men younger than 44  Other - Limit sweets and other desserts  - Use herbs and spices instead of salt to flavor foods  - Herbs and spices common to the traditional Mediterranean Diet include: basil, bay leaves, chives, cloves, cumin, fennel, garlic, lavender, marjoram, mint, oregano, parsley, pepper, rosemary, sage, savory, sumac, tarragon, thyme   It's not just a diet, it's a lifestyle:  . The Mediterranean diet includes lifestyle factors typical of those in the region  . Foods, drinks and meals are best eaten  with others and savored . Daily physical activity is important for overall good health . This could be strenuous exercise like running and aerobics . This could also be more leisurely activities such as walking, housework, yard-work, or taking the stairs . Moderation is the key; a balanced and healthy diet accommodates most foods and drinks . Consider portion sizes and frequency of consumption of certain foods   Meal Ideas & Options:  . Breakfast:  o Whole wheat toast or whole wheat English muffins with peanut butter & hard boiled egg o Steel cut oats topped with apples & cinnamon and skim milk  o Fresh fruit: banana, strawberries, melon, berries, peaches  o Smoothies: strawberries, bananas, greek yogurt, peanut butter o Low fat greek yogurt with blueberries and granola  o Egg white omelet with spinach and mushrooms o Breakfast couscous: whole wheat couscous, apricots, skim milk, cranberries  . Sandwiches:  o Hummus and grilled vegetables (peppers, zucchini, squash) on whole wheat bread   o Grilled chicken on whole wheat pita with lettuce, tomatoes, cucumbers or tzatziki  o Tuna salad on whole wheat bread: tuna salad made with greek yogurt, olives, red peppers, capers, green onions o Garlic rosemary lamb pita: lamb sauted with garlic, rosemary, salt & pepper; add lettuce, cucumber, greek yogurt to pita - flavor with lemon juice and black pepper  . Seafood:  o Mediterranean grilled salmon, seasoned with garlic, basil, parsley, lemon juice and black pepper o Shrimp, lemon, and spinach  whole-grain pasta salad made with low fat greek yogurt  o Seared scallops with lemon orzo  o Seared tuna steaks seasoned salt, pepper, coriander topped with tomato mixture of olives, tomatoes, olive oil, minced garlic, parsley, green onions and cappers  . Meats:  o Herbed greek chicken salad with kalamata olives, cucumber, feta  o Red bell peppers stuffed with spinach, bulgur, lean ground beef (or lentils) &  topped with feta   o Kebabs: skewers of chicken, tomatoes, onions, zucchini, squash  o [place] burgers: made with red onions, mint, dill, lemon juice, feta cheese topped with roasted red peppers . Vegetarian o Cucumber salad: cucumbers, artichoke hearts, celery, red onion, feta cheese, tossed in olive oil & lemon juice  o Hummus and whole grain pita points with a greek salad (lettuce, tomato, feta, olives, cucumbers, red onion) o Lentil soup with celery, carrots made with vegetable broth, garlic, salt and pepper  o Tabouli salad: parsley, bulgur, mint, scallions, cucumbers, tomato, radishes, lemon juice, olive oil, salt and pepper.      American Heart Association (AHA) Exercise Recommendation  Being physically active is important to prevent heart disease and stroke, the nation's No. 1and No. 5killers. To improve overall cardiovascular health, we suggest at least 150 minutes per week of moderate exercise or 75 minutes per week of vigorous exercise (or a combination of moderate and vigorous activity). Thirty minutes a day, five times a week is an easy goal to remember. You will also experience benefits even if you divide your time into two or three segments of 10 to 15 minutes per day.  For people who would benefit from lowering their blood pressure or cholesterol, we recommend 40 minutes of aerobic exercise of moderate to vigorous intensity three to four times a week to lower the risk for heart attack and stroke.  Physical activity is anything that makes you move your body and burn calories.  This includes things like climbing stairs or playing sports. Aerobic exercises benefit your heart, and include walking, jogging, swimming or biking. Strength and stretching exercises are best for overall stamina and flexibility.  The simplest, positive change you can make to effectively improve your heart health is to start walking. It's enjoyable, free, easy, social and great exercise. A walking program is  flexible and boasts high success rates because people can stick with it. It's easy for walking to become a regular and satisfying part of life.   For Overall Cardiovascular Health:  At least 30 minutes of moderate-intensity aerobic activity at least 5 days per week for a total of 150  OR   At least 25 minutes of vigorous aerobic activity at least 3 days per week for a total of 75 minutes; or a combination of moderate- and vigorous-intensity aerobic activity  AND   Moderate- to high-intensity muscle-strengthening activity at least 2 days per week for additional health benefits.  For Lowering Blood Pressure and Cholesterol  An average 40 minutes of moderate- to vigorous-intensity aerobic activity 3 or 4 times per week  What if I can't make it to the time goal? Something is always better than nothing! And everyone has to start somewhere. Even if you've been sedentary for years, today is the day you can begin to make healthy changes in your life. If you don't think you'll make it for 30 or 40 minutes, set a reachable goal for today. You can work up toward your overall goal by increasing your time as you get stronger. Don't let all-or-nothing thinking  rob you of doing what you can every day.  Source:http://www.heart.org

## 2014-08-17 NOTE — Progress Notes (Signed)
Patient ID: Gwendolyn Hansen, female   DOB: 30-Oct-1970, 44 y.o.   MRN: [id number]         Patient: Gwendolyn Hansen, Female    DOB: Jan 30, 1970, 44 y.o.   MRN: [id number] Visit Date: 08/17/2014  Today's Provider: Margaretann Loveless, PA-C   Chief Complaint  Patient presents with  . Establish Care   Subjective:    Annual physical exam Gwendolyn Hansen is a 44 y.o. female who presents today for health maintenance and complete physical. She feels fairly well. She reports exercising some, just started last 2 weeks.  [employer] She reports she is sleeping well.  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 She has had normal Pap smears since. Most recent Pap prior to today was 17 years ago after the birth of her daughter. She does not have any family history of cervical or ovarian cancer. She does not perform monthly breast exams due to breast augmentation. It has been a while since her most recent mammogram as well. There is no family history of breast cancer. She has not had a colonoscopy. There is no family history of colon cancer.  -----------------------------------------------------------------   Review of Systems  Constitutional: Negative.   HENT: Negative.   Eyes: Negative.   Respiratory: Negative.   Cardiovascular: Negative.   Gastrointestinal: Negative.   Endocrine: Negative.   Genitourinary: Negative.   Musculoskeletal: Positive for arthralgias and neck pain. Negative for myalgias, back pain, joint swelling, gait problem and neck stiffness.  Skin: Negative.   Allergic/Immunologic: Negative.   Neurological: Negative.   Hematological: Negative.   Psychiatric/Behavioral: Negative.     Social History She  reports that she has been smoking Cigarettes.  She has a 28 pack-year smoking history. She has never used smokeless tobacco. She reports that she does not drink alcohol or use illicit drugs.  Patient Active Problem List   Diagnosis Date  Noted  . Neck pain 08/17/2014  . Ankle pain 08/17/2014    Past Surgical History  Procedure Laterality Date  . Tonsillectomy    . Breast enhancement surgery    . Ankle surgery    . Neck surgery      Family History Her family history includes COPD in her mother.    No Known Allergies  Previous Medications   AMITRIPTYLINE (ELAVIL) 100 MG TABLET    TK 1 T PO HS   AMPHETAMINE-DEXTROAMPHETAMINE (ADDERALL) 30 MG TABLET    TK 1 T PO BID   BUPRENORPHINE (SUBUTEX) 8 MG SUBL SL TABLET    PLACE 1 TABLET UNDER TONGUE D   DIAZEPAM (VALIUM) 10 MG TABLET    1 tablet 3 (three) times daily.    Patient Care Team: Margaretann Loveless, PA-C as PCP - General (Physician Assistant)     Objective:   Vitals: BP 122/78 mmHg  Pulse 74  Temp(Src) 98.7 F (37.1 C) (Oral)  Resp 18  Ht 5' 9.5" (1.765 m)  Wt 186 lb 3.2 oz (84.46 kg)  BMI 27.11 kg/m2   Physical Exam  Constitutional: She is oriented to person, place, and time. She appears well-developed and well-nourished. No distress.  HENT:  Head: Normocephalic and atraumatic.  Right Ear: Hearing, tympanic membrane, external ear and ear canal normal.  Left Ear: Hearing, tympanic membrane, external ear and ear canal normal.  Nose: Nose normal.  Mouth/Throat: Uvula is midline, oropharynx is clear and moist and mucous membranes are normal. No oropharyngeal exudate.  Eyes: Conjunctivae  and EOM are normal. Pupils are equal, round, and reactive to light. Right eye exhibits no discharge. Left eye exhibits no discharge. No scleral icterus.  Neck: Normal range of motion. Neck supple. No JVD present. Carotid bruit is not present. No tracheal deviation present. No thyromegaly present.  Cardiovascular: Normal rate, regular rhythm, normal heart sounds and intact distal pulses.  Exam reveals no gallop and no friction rub.   No murmur heard. Pulmonary/Chest: Effort normal and breath sounds normal. No respiratory distress. She has no wheezes. She has no rales.  She exhibits no tenderness. Right breast exhibits no inverted nipple, no mass, no nipple discharge, no skin change and no tenderness. Left breast exhibits no inverted nipple, no mass, no nipple discharge, no skin change and no tenderness. Breasts are symmetrical.    Abdominal: Soft. Bowel sounds are normal. She exhibits no distension and no mass. There is no tenderness. There is no rebound and no guarding. Hernia confirmed negative in the right inguinal area and confirmed negative in the left inguinal area.  Genitourinary: Rectum normal, vagina normal and uterus normal. No breast swelling, tenderness, discharge or bleeding. Pelvic exam was performed with patient supine. There is no rash, tenderness, lesion or injury on the right labia. There is no rash, tenderness, lesion or injury on the left labia. Cervix exhibits no motion tenderness, no discharge and no friability. Right adnexum displays no mass, no tenderness and no fullness. Left adnexum displays no mass, no tenderness and no fullness. No erythema, tenderness or bleeding in the vagina. No signs of injury around the vagina. No vaginal discharge found.    Musculoskeletal: Normal range of motion. She exhibits no edema or tenderness.  Lymphadenopathy:    She has no cervical adenopathy.       Right: No inguinal adenopathy present.       Left: No inguinal adenopathy present.  Neurological: She is alert and oriented to person, place, and time. She has normal reflexes. No cranial nerve deficit. Coordination normal.  Skin: Skin is warm and dry. No rash noted. She is not diaphoretic.  Psychiatric: She has a normal mood and affect. Her behavior is normal. Judgment and thought content normal.  Vitals reviewed.    Depression Screen PHQ 2/9 Scores 08/17/2014  PHQ - 2 Score 0      Assessment & Plan:     Routine Health Maintenance and Physical Exam  Exercise Activities and Dietary recommendations Goals    None       There is no  immunization history on file for this patient.  Health Maintenance  Topic Date Due  . HIV Screening  07/10/1983  . PAP SMEAR  07/10/1986  . TETANUS/TDAP  07/10/1987  . INFLUENZA VACCINE  08/21/2014      Discussed health benefits of physical activity, and encouraged her to engage in regular exercise appropriate for her age and condition.   1. Annual physical exam We'll check labs. Follow up pending labs. - TSH - Lipid Profile - CBC with Differential - Comprehensive Metabolic Panel (CMET)  2. Breast cancer screening - MM Digital Screening; Future  3. Cervical cancer screening - Pap IG w/ reflex to HPV when ASC-U  4. GAD (generalized anxiety disorder) She does have severe generalized anxiety disorder stating that when she has a bad episode that she will pass out in public places. She has been well controlled on Valium 10 mg. She was originally seen and tested for anxiety at [facility] medical clinic.  5. Depression Stable and well  controlled on Elavil 100 mg.  6. Neck pain Long-standing history of cervical neck pain. She reports that she had seen a chiropractor in the past for a manual manipulation and had severe pain afterwards. She went and had an x-ray and was found to have fractures in C4-C6. She does mention that she try to contact the chiropractor and they would not see her again following the discovery of the cervical fractures. She did go see a neurosurgeon and was placed on narcotic pain medication. She did develop an addiction to the pain medication and is currently on Subutex 8 mg for the pain and to stay off of the narcotic pain medication.  7. History of narcotic addiction See above.  8. History of alcohol dependence History of severe alcohol dependence with multiple DUIs. There was 1. she did lose custody of her 72 year old daughter. She has now been sober since 2010. She does feel that she is doing well and does not have cravings as bad anymore.      --------------------------------------------------------------------

## 2014-08-18 ENCOUNTER — Encounter: Payer: Self-pay | Admitting: Physician Assistant

## 2014-08-18 DIAGNOSIS — F17200 Nicotine dependence, unspecified, uncomplicated: Secondary | ICD-10-CM | POA: Insufficient documentation

## 2014-08-21 LAB — PAP IG W/ RFLX HPV ASCU: PAP Smear Comment: 0

## 2015-01-15 ENCOUNTER — Emergency Department: Payer: Self-pay

## 2015-01-15 ENCOUNTER — Emergency Department
Admission: EM | Admit: 2015-01-15 | Discharge: 2015-01-16 | Disposition: A | Discharge status: Other Acute Inpatient Hospital | Payer: Self-pay | Attending: Emergency Medicine | Admitting: Emergency Medicine

## 2015-01-15 DIAGNOSIS — F329 Major depressive disorder, single episode, unspecified: Secondary | ICD-10-CM | POA: Insufficient documentation

## 2015-01-15 DIAGNOSIS — F419 Anxiety disorder, unspecified: Secondary | ICD-10-CM | POA: Insufficient documentation

## 2015-01-15 DIAGNOSIS — F1310 Sedative, hypnotic or anxiolytic abuse, uncomplicated: Secondary | ICD-10-CM | POA: Insufficient documentation

## 2015-01-15 DIAGNOSIS — Z79899 Other long term (current) drug therapy: Secondary | ICD-10-CM | POA: Insufficient documentation

## 2015-01-15 DIAGNOSIS — F1520 Other stimulant dependence, uncomplicated: Secondary | ICD-10-CM

## 2015-01-15 DIAGNOSIS — F32A Depression, unspecified: Secondary | ICD-10-CM

## 2015-01-15 DIAGNOSIS — F22 Delusional disorders: Secondary | ICD-10-CM | POA: Insufficient documentation

## 2015-01-15 DIAGNOSIS — Z3202 Encounter for pregnancy test, result negative: Secondary | ICD-10-CM | POA: Insufficient documentation

## 2015-01-15 DIAGNOSIS — F1120 Opioid dependence, uncomplicated: Secondary | ICD-10-CM | POA: Diagnosis present

## 2015-01-15 DIAGNOSIS — F1510 Other stimulant abuse, uncomplicated: Secondary | ICD-10-CM | POA: Insufficient documentation

## 2015-01-15 DIAGNOSIS — F17210 Nicotine dependence, cigarettes, uncomplicated: Secondary | ICD-10-CM | POA: Insufficient documentation

## 2015-01-15 LAB — COMPREHENSIVE METABOLIC PANEL
ALT: 13 U/L — ABNORMAL LOW (ref 14–54)
ANION GAP: 5 (ref 5–15)
AST: 17 U/L (ref 15–41)
Albumin: 4.5 g/dL (ref 3.5–5.0)
Alkaline Phosphatase: 37 U/L — ABNORMAL LOW (ref 38–126)
BUN: 6 mg/dL (ref 6–20)
CHLORIDE: 102 mmol/L (ref 101–111)
CO2: 31 mmol/L (ref 22–32)
Calcium: 9.3 mg/dL (ref 8.9–10.3)
Creatinine, Ser: 0.82 mg/dL (ref 0.44–1.00)
Glucose, Bld: 112 mg/dL — ABNORMAL HIGH (ref 65–99)
POTASSIUM: 4.2 mmol/L (ref 3.5–5.1)
SODIUM: 138 mmol/L (ref 135–145)
Total Bilirubin: 0.3 mg/dL (ref 0.3–1.2)
Total Protein: 7 g/dL (ref 6.5–8.1)

## 2015-01-15 LAB — URINE DRUG SCREEN, QUALITATIVE (ARMC ONLY)
AMPHETAMINES, UR SCREEN: POSITIVE — AB
BARBITURATES, UR SCREEN: NOT DETECTED
BENZODIAZEPINE, UR SCRN: POSITIVE — AB
CANNABINOID 50 NG, UR ~~LOC~~: NOT DETECTED
Cocaine Metabolite,Ur ~~LOC~~: NOT DETECTED
MDMA (Ecstasy)Ur Screen: NOT DETECTED
Methadone Scn, Ur: NOT DETECTED
OPIATE, UR SCREEN: NOT DETECTED
PHENCYCLIDINE (PCP) UR S: NOT DETECTED
Tricyclic, Ur Screen: POSITIVE — AB

## 2015-01-15 LAB — CBC
HEMATOCRIT: 46.8 % (ref 35.0–47.0)
Hemoglobin: 15.5 g/dL (ref 12.0–16.0)
MCH: 29.6 pg (ref 26.0–34.0)
MCHC: 33.1 g/dL (ref 32.0–36.0)
MCV: 89.5 fL (ref 80.0–100.0)
PLATELETS: 282 10*3/uL (ref 150–440)
RBC: 5.23 MIL/uL — ABNORMAL HIGH (ref 3.80–5.20)
RDW: 14.5 % (ref 11.5–14.5)
WBC: 5.7 10*3/uL (ref 3.6–11.0)

## 2015-01-15 LAB — POCT PREGNANCY, URINE: PREG TEST UR: NEGATIVE
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
## 2015-01-15 MED ORDER — AMITRIPTYLINE HCL 100 MG PO TABS
100.0000 mg | ORAL_TABLET | Freq: Every day | ORAL | Status: DC
  Administered 2015-01-15: 100 mg via ORAL
  Filled 2015-01-15 (×2): qty 1

## 2015-01-15 MED ORDER — QUETIAPINE FUMARATE 25 MG PO TABS
ORAL_TABLET | ORAL | Status: AC
Start: 2015-01-15 — End: 2015-01-16
  Filled 2015-01-15: qty 1

## 2015-01-15 MED ORDER — FOLIC ACID 1 MG PO TABS
ORAL_TABLET | ORAL | Status: AC
  Administered 2015-01-15: 1 mg via ORAL
  Filled 2015-01-15: qty 1

## 2015-01-15 MED ORDER — QUETIAPINE FUMARATE 25 MG PO TABS
ORAL_TABLET | ORAL | Status: AC
  Administered 2015-01-15: 50 mg via ORAL
  Filled 2015-01-15: qty 2

## 2015-01-15 MED ORDER — DIAZEPAM 5 MG PO TABS
ORAL_TABLET | ORAL | Status: AC
  Administered 2015-01-15: 5 mg via ORAL
  Filled 2015-01-15: qty 1

## 2015-01-15 MED ORDER — DIAZEPAM 5 MG PO TABS
5.0000 mg | ORAL_TABLET | Freq: Three times a day (TID) | ORAL | Status: DC | PRN
  Administered 2015-01-15 – 2015-01-16 (×2): 5 mg via ORAL
  Filled 2015-01-15: qty 1

## 2015-01-15 MED ORDER — VITAMIN B-1 100 MG PO TABS
ORAL_TABLET | ORAL | Status: AC
  Administered 2015-01-15: 100 mg via ORAL
  Filled 2015-01-15: qty 1

## 2015-01-15 MED ORDER — QUETIAPINE FUMARATE 25 MG PO TABS
50.0000 mg | ORAL_TABLET | Freq: Every day | ORAL | Status: DC
  Administered 2015-01-15: 50 mg via ORAL

## 2015-01-15 MED ORDER — ADULT MULTIVITAMIN W/MINERALS CH: 1.0000 | ORAL_TABLET | Freq: Once | ORAL | Status: DC

## 2015-01-15 MED ORDER — DIAZEPAM 5 MG PO TABS: 10.0000 mg | ORAL_TABLET | Freq: Three times a day (TID) | ORAL | Status: DC

## 2015-01-15 MED ORDER — AMPHETAMINE-DEXTROAMPHETAMINE 5 MG PO TABS
30.0000 mg | ORAL_TABLET | Freq: Two times a day (BID) | ORAL | Status: DC
  Filled 2015-01-15: qty 6

## 2015-01-15 MED ORDER — FOLIC ACID 1 MG PO TABS
1.0000 mg | ORAL_TABLET | Freq: Once | ORAL | Status: AC
  Administered 2015-01-15: 1 mg via ORAL

## 2015-01-15 MED ORDER — VITAMIN B-1 100 MG PO TABS
100.0000 mg | ORAL_TABLET | Freq: Once | ORAL | Status: AC
  Administered 2015-01-15: 100 mg via ORAL

## 2015-01-15 MED ORDER — BUPRENORPHINE HCL 8 MG SL SUBL
8.0000 mg | SUBLINGUAL_TABLET | Freq: Two times a day (BID) | SUBLINGUAL | Status: DC
  Administered 2015-01-15 – 2015-01-16 (×2): 8 mg via SUBLINGUAL
  Filled 2015-01-15 (×3): qty 1

## 2015-01-15 NOTE — ED Notes (Signed)
Dressed pt out in family room. Pt had 2 earrings, 1 watch and 1 ring. Phineas Semeniffany Taiwo Fish (RN) was a witness and had pt belongings locked up by the Engineer, materialssecurity officer.

## 2015-01-15 NOTE — ED Notes (Signed)
Pt speaking with TTS. Maintained on 15 minute checks and observation by security camera for safety. 

## 2015-01-15 NOTE — ED Notes (Signed)
Pt paranoid when RN delivered dinner tray. Pt was in day room and RN told her she would have to eat in her room. Pt asked, So, I have to eat that? I have to eat right now?"  RN explained to pt she did not have to eat at this time, but she did have to eat in her room. Pt had blank expression and appeared confused. Pt returned to her room.

## 2015-01-15 NOTE — ED Notes (Signed)
Patient is repeatedly approaching the nurses station asking why she has to stay here until Sunday because the expiration date on her evening snack is Sunday.  Patient repeatedly asks why she cannot have her normal medications and expresses that she does not need to be here.  When this writer administers medications that are ordered for the patient, she holds the medication in her hand and repeatedly asks why she should take it and why she cannot have her normal medications.

## 2015-01-15 NOTE — ED Notes (Signed)
[place] in via [place] officer under involuntary commitment. Pt reports unsure why she is here and with flat affect. Denies SI/HI

## 2015-01-15 NOTE — ED Notes (Signed)
Patient emerged from her room and told this writer that [facility] yesterday and now her stomach was hurting.  She also stated that she was very anxious and could not sleep.  MD was notified.

## 2015-01-15 NOTE — ED Notes (Signed)
Report received from Ruth RN.

## 2015-01-15 NOTE — ED Notes (Signed)
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
## 2015-01-15 NOTE — ED Notes (Signed)
Pt constantly at door with snack bag stating ''the bag says that I am here till Sunday.'' Pt was told several times that the date on the bag is not how long she is going to be here that the date is when the bag expires. Pt states ''I can not throw the bag away because I am being filmed.'' Pt was told several times that the security cameras are in place for her safety and ours as well.
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
## 2015-01-15 NOTE — ED Notes (Addendum)
Pt ambulated to BHU bed 5. Oriented to unit. Pt denies SI/HI and AVH. Denies pain. Pt is oriented to place and time. Disorganized thinking and thought blocking noted. Pt doesn't understand why she is in the hospital or [facility]. Maintained on 15 minute checks and observation by security camera for safety.

## 2015-01-15 NOTE — ED Notes (Signed)
IVC/TTS consult ordered/All paperwork on chart
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
## 2015-01-15 NOTE — ED Notes (Signed)
Patient is sitting up in her bed, tearful, stating that she is in jail.  Patient is informed that she is in the hospital, not jail.  Patient is mumbling and tearful and stating that she wants to see her mother and sister and tell them that she is sorry.

## 2015-01-15 NOTE — ED Notes (Signed)
Pt using phone to call her mother.  

## 2015-01-15 NOTE — ED Notes (Signed)
Pt in day room, very confused. Pt believes she is going to jail. "I heard that counselor say I needed to be in handcuffs." RN reassured pt she was not going to jail, nor would she be placed in handcuff. Pt has a blank expression and difficult to orient. Maintained on 15 minute checks and observation by security camera for safety.

## 2015-01-15 NOTE — Clinical Social Work Note (Signed)
LCSW was given the opportunity to meet with the patient and care provider her mother. Patient was supported by Patient advocate. In our discussion patient reported she was OK and didn't know why she was here. In questioning she stated she was not suicidal or homicidal. She was supposed to follow up with [facility] but had no way of getting there. She does not hear voices and see things. At times she did state she feels unsafe around people and thinks they are messing with her and her medications. According to mother pt has a history of schizophrenia. She lives with her boyfriend and he is a good support for her but he is gone all day and works in [employer]. Patient is oriented to time and place but struggles to keep her thoughts organized and appears very confused at times and repeats herself several times in a 5 minute segment.
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
## 2015-01-15 NOTE — ED Notes (Signed)
Snack given.

## 2015-01-15 NOTE — ED Provider Notes (Signed)
[facility] Emergency Department Provider Note  ____________________________________________  Time seen: On arrival  I have reviewed the triage vital signs and the nursing notes.   HISTORY  Chief Complaint Altered Mental Status    HPI Gwendolyn Hansen is a 44 y.o. female who presents with confusion and paranoia. Her mother reports she has a history of schizophrenia and when she came home today the patient had torn up her house because she felt like she was being watched. Reportedly she has been compliant with her medications. Patient does admit to paranoia. Patient is somewhat confused as well     Past Medical History  Diagnosis Date  . Asthma   . Allergy     Patient Active Problem List   Diagnosis Date Noted  . Tobacco use disorder 08/18/2014  . Neck pain 08/17/2014  . Ankle pain 08/17/2014  . GAD (generalized anxiety disorder) 08/17/2014  . Depression 08/17/2014  . History of narcotic addiction (HCC) 08/17/2014  . History of alcohol dependence (HCC) 08/17/2014    Past Surgical History  Procedure Laterality Date  . Tonsillectomy    . Breast enhancement surgery    . Ankle surgery    . Neck surgery      Current Outpatient Rx  Name  Route  Sig  Dispense  Refill  . amitriptyline (ELAVIL) 100 MG tablet   Oral   Take 100 mg by mouth at bedtime.         Marland Kitchen amphetamine-dextroamphetamine (ADDERALL) 30 MG tablet   Oral   Take 30 mg by mouth 2 (two) times daily.         . buprenorphine (SUBUTEX) 8 MG SUBL SL tablet   Sublingual   Place 8 mg under the tongue 2 (two) times daily.         . diazepam (VALIUM) 10 MG tablet   Oral   Take 10 mg by mouth 3 (three) times daily.       0     Allergies Review of patient's allergies indicates no known allergies.  Family History  Problem Relation Age of Onset  . COPD Mother   . Anxiety disorder Mother   . Emphysema Father   . Alcohol abuse Father   . Anxiety disorder Father   .  Leukemia Father   . Bipolar disorder Sister   . Colon cancer Maternal Grandmother     Social History Social History  Substance Use Topics  . Smoking status: Current Every Day Smoker -- 1.00 packs/day for 28 years    Types: Cigarettes  . Smokeless tobacco: Never Used  . Alcohol Use: No     Comment: quit drinking in 2010    Review of Systems limited by confusion  Constitutional: Negative for fever. Eyes: Denies visual changes.  Cardiovascular: Negative for chest pain. Respiratory: Negative for shortness of breath. Gastrointestinal: Negative for abdominal pain, vomiting and diarrhea. Genitourinary: Negative for dysuria. Musculoskeletal: Negative for back pain. Skin: Negative for rash. Neurological: Negative for headaches Psychiatric: Reports a history of anxiety disorder    ____________________________________________   PHYSICAL EXAM:  VITAL SIGNS: ED Triage Vitals  Enc Vitals Group     BP 01/15/15 1246 104/67 mmHg     Pulse Rate 01/15/15 1246 88     Resp 01/15/15 1246 16     Temp 01/15/15 1246 97.8 F (36.6 C)     Temp Source 01/15/15 1246 Oral     SpO2 01/15/15 1246 96 %     Weight 01/15/15 1246  180 lb (81.647 kg)     Height 01/15/15 1246 5\' 11"  (1.803 m)     Head Cir --      Peak Flow --      Pain Score --      Pain Loc --      Pain Edu? --      Excl. in GC? --      Constitutional: Alert and oriented. Well appearing and in no distress. Eyes: Conjunctivae are normal.  ENT   Head: Normocephalic and atraumatic.   Mouth/Throat: Mucous membranes are moist. Cardiovascular: Normal rate, regular rhythm. Normal and symmetric distal pulses are present in all extremities.  Respiratory: Normal respiratory effort without tachypnea nor retractions. Breath sounds are clear and equal bilaterally.  Gastrointestinal: Soft and non-tender in all quadrants. No distention. There is no CVA tenderness. Genitourinary: deferred Musculoskeletal: Nontender with normal  range of motion in all extremities. No lower extremity tenderness nor edema. Neurologic:  Normal speech and language. No gross focal neurologic deficits are appreciated. Skin:  Skin is warm, dry and intact. No rash noted. Psychiatric: Flat affect, normal mood.Patient mentions to me that she is sorry that she did not have her keys and so she could not go to [employer] house.  ____________________________________________    LABS (pertinent positives/negatives)  Labs Reviewed  CBC - Abnormal; Notable for the following:    RBC 5.23 (*)    All other components within normal limits  URINE DRUG SCREEN, QUALITATIVE ([facility] ONLY) - Abnormal; Notable for the following:    Tricyclic, Ur Screen POSITIVE (*)    Amphetamines, Ur Screen POSITIVE (*)    Benzodiazepine, Ur Scrn POSITIVE (*)    All other components within normal limits  COMPREHENSIVE METABOLIC PANEL - Abnormal; Notable for the following:    Glucose, Bld 112 (*)    ALT 13 (*)    Alkaline Phosphatase 37 (*)    All other components within normal limits  POCT PREGNANCY, URINE    ____________________________________________   EKG  None  ____________________________________________    RADIOLOGY I have personally reviewed any xrays that were ordered on this patient: None  ____________________________________________   PROCEDURES  Procedure(s) performed: none  Critical Care performed: none  ____________________________________________   INITIAL IMPRESSION / ASSESSMENT AND PLAN / ED COURSE  Pertinent labs & imaging results that were available during my care of the patient were reviewed by me and considered in my medical decision making (see chart for details).  Patient brought in by police under involuntary commitment. I completed this involuntary commitment because patient is exuding signs of schizophrenia exacerbation/paranoia/confusion. I will consult psychiatry. We will start her home meds. She is medically clear this  time ____________________________________________   FINAL CLINICAL IMPRESSION(S) / ED DIAGNOSES  Final diagnoses:  Paranoia (HCC)     Jene Everyobert Teana Lindahl, MD 01/15/15 1444

## 2015-01-15 NOTE — ED Notes (Signed)
Pt pacing from her room to day room, tearful. Maintained on 15 minute checks and observation by security camera for safety.

## 2015-01-16 ENCOUNTER — Inpatient Hospital Stay
Admission: EM | Admit: 2015-01-16 | Discharge: 2015-01-22 | DRG: 885 | Disposition: A | Discharge status: Home / Self Care | Payer: No Typology Code available for payment source | Source: Intra-hospital | Attending: Psychiatry | Admitting: Psychiatry

## 2015-01-16 DIAGNOSIS — Z806 Family history of leukemia: Secondary | ICD-10-CM

## 2015-01-16 DIAGNOSIS — Z800 Family history of malignant neoplasm of digestive organs: Secondary | ICD-10-CM

## 2015-01-16 DIAGNOSIS — F329 Major depressive disorder, single episode, unspecified: Secondary | ICD-10-CM | POA: Diagnosis present

## 2015-01-16 DIAGNOSIS — Z818 Family history of other mental and behavioral disorders: Secondary | ICD-10-CM | POA: Diagnosis not present

## 2015-01-16 DIAGNOSIS — F1520 Other stimulant dependence, uncomplicated: Secondary | ICD-10-CM

## 2015-01-16 DIAGNOSIS — Z9889 Other specified postprocedural states: Secondary | ICD-10-CM

## 2015-01-16 DIAGNOSIS — F17200 Nicotine dependence, unspecified, uncomplicated: Secondary | ICD-10-CM | POA: Diagnosis present

## 2015-01-16 DIAGNOSIS — F1510 Other stimulant abuse, uncomplicated: Secondary | ICD-10-CM | POA: Diagnosis present

## 2015-01-16 DIAGNOSIS — F28 Other psychotic disorder not due to a substance or known physiological condition: Secondary | ICD-10-CM

## 2015-01-16 DIAGNOSIS — Z811 Family history of alcohol abuse and dependence: Secondary | ICD-10-CM | POA: Diagnosis not present

## 2015-01-16 DIAGNOSIS — J45909 Unspecified asthma, uncomplicated: Secondary | ICD-10-CM | POA: Diagnosis present

## 2015-01-16 DIAGNOSIS — F17210 Nicotine dependence, cigarettes, uncomplicated: Secondary | ICD-10-CM | POA: Diagnosis present

## 2015-01-16 DIAGNOSIS — F22 Delusional disorders: Secondary | ICD-10-CM | POA: Diagnosis present

## 2015-01-16 DIAGNOSIS — Z825 Family history of asthma and other chronic lower respiratory diseases: Secondary | ICD-10-CM

## 2015-01-16 DIAGNOSIS — E8881 Metabolic syndrome: Secondary | ICD-10-CM | POA: Diagnosis present

## 2015-01-16 DIAGNOSIS — F411 Generalized anxiety disorder: Secondary | ICD-10-CM | POA: Diagnosis present

## 2015-01-16 DIAGNOSIS — F1120 Opioid dependence, uncomplicated: Secondary | ICD-10-CM | POA: Diagnosis present

## 2015-01-16 DIAGNOSIS — F19950 Other psychoactive substance use, unspecified with psychoactive substance-induced psychotic disorder with delusions: Secondary | ICD-10-CM | POA: Diagnosis present

## 2015-01-16 LAB — LIPID PANEL
CHOL/HDL RATIO: 4.3 ratio
CHOLESTEROL: 202 mg/dL — AB (ref 0–200)
HDL: 47 mg/dL (ref >40)
LDL CALC: 138 mg/dL — AB (ref 0–99)
TRIGLYCERIDES: 85 mg/dL (ref <150)
VLDL: 17 mg/dL (ref 0–40)

## 2015-01-16 LAB — GLUCOSE, CAPILLARY: Glucose-Capillary: 107 mg/dL — ABNORMAL HIGH (ref 65–99)

## 2015-01-16 LAB — TSH: TSH: 0.656 u[IU]/mL (ref 0.350–4.500)

## 2015-01-16 MED ORDER — BUPRENORPHINE HCL 2 MG SL SUBL
8.0000 mg | SUBLINGUAL_TABLET | Freq: Two times a day (BID) | SUBLINGUAL | Status: DC
  Administered 2015-01-16 – 2015-01-17 (×2): 8 mg via SUBLINGUAL
  Filled 2015-01-16 (×2): qty 4

## 2015-01-16 MED ORDER — MAGNESIUM CITRATE PO SOLN
1.0000 | Freq: Once | ORAL | Status: AC
  Administered 2015-01-16: 1 via ORAL
  Filled 2015-01-16: qty 296

## 2015-01-16 MED ORDER — HALOPERIDOL 0.5 MG PO TABS
1.0000 mg | ORAL_TABLET | Freq: Two times a day (BID) | ORAL | Status: DC
  Administered 2015-01-16 – 2015-01-17 (×2): 1 mg via ORAL
  Filled 2015-01-16 (×2): qty 2

## 2015-01-16 MED ORDER — MAGNESIUM HYDROXIDE 400 MG/5ML PO SUSP
30.0000 mL | Freq: Every day | ORAL | Status: DC | PRN
Start: 2015-01-16 — End: 2015-01-22

## 2015-01-16 MED ORDER — ACETAMINOPHEN 325 MG PO TABS
650.0000 mg | ORAL_TABLET | Freq: Four times a day (QID) | ORAL | Status: DC | PRN
  Administered 2015-01-22: 650 mg via ORAL
  Filled 2015-01-16: qty 2

## 2015-01-16 MED ORDER — AMITRIPTYLINE HCL 50 MG PO TABS
100.0000 mg | ORAL_TABLET | Freq: Every day | ORAL | Status: DC
  Administered 2015-01-16 – 2015-01-21 (×6): 100 mg via ORAL
  Filled 2015-01-16 (×6): qty 2

## 2015-01-16 MED ORDER — ALUM & MAG HYDROXIDE-SIMETH 200-200-20 MG/5ML PO SUSP: 30.0000 mL | ORAL | Status: DC | PRN

## 2015-01-16 NOTE — ED Notes (Signed)
Patient remains flat affect, quiet and slow to respond, no SI/HI noted, Patient with 15 min q checks and camera monitoring, patient sitting on bed, Patient is safe , will continue to monitor.

## 2015-01-16 NOTE — ED Notes (Signed)
Lunch served

## 2015-01-16 NOTE — ED Notes (Signed)
Dr. Clapacs talking with patient.  

## 2015-01-16 NOTE — ED Notes (Signed)
Patient did not eat dinner, patient is confused and thinks she is going to jail, no matter what nurse tells her, she is fixated on jail. [facility] Patient is safe.

## 2015-01-16 NOTE — ED Provider Notes (Signed)
-----------------------------------------   2:56 PM on 01/16/2015 -----------------------------------------  Medically stable, admitted to behavioral health   Loleta Roseory Maciah Schweigert, MD 01/16/15 [phone]

## 2015-01-16 NOTE — ED Notes (Signed)
Patient up to dayroom, patient asking about when she will see Doctor Clapacs, Nurse let her know that Psychiatrist is here and would be talking to her soon. Patient ask for phone and she states she wants to talk with her mom. Nurse gave her phone. Patient talking calmly on phone.

## 2015-01-16 NOTE — ED Notes (Signed)
Patient with visitor in room, visit is calm and appropriate.

## 2015-01-16 NOTE — ED Notes (Signed)
[facility], Patient remains slow and flat affect, report called and patient's belongings taken with patient.

## 2015-01-16 NOTE — ED Notes (Signed)
Patient is confused, repeats self often, focused on coming in with  Handcuffs and also wants to see Doctor. Patient is very slow to respond to questions, nurse will ask yes or no questions and patient cannot answer, but will slowly start talking about other things. Patient did say she started drinking at a young age, she drinks now but not often and she lives with her boyfriend, whom treats her well. Patient states " I don't need to be here, I need outpatient" denies Si/HI/ , patient would not answer to question regarding hearing voices. Patient refused her addero medication this morning. Nurse will monitor, q 15 min. Checks, and constant camera monitoring.

## 2015-01-16 NOTE — ED Notes (Signed)
Patient is safe, Behavioral Medicine is ready to take Patient, [facility]

## 2015-01-16 NOTE — Consult Note (Signed)
[facility] Consult   Reason for Consult:  Consult for this 44 year old woman with past history of substance abuse who is brought to the hospital with concerns by her family that she has become increasingly psychotic. Referring Physician:  Jimmye Norman Patient Identification: Gwendolyn Hansen MRN:  [id number] Principal Diagnosis: Psychosis Diagnosis:   Patient Active Problem List   Diagnosis Date Noted  . 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 Tobacco use disorder [F17.200] 08/18/2014  . Neck pain [M54.2] 08/17/2014  . Ankle pain [M25.579] 08/17/2014  . GAD (generalized anxiety disorder) [F41.1] 08/17/2014  . Depression [F32.9] 08/17/2014  . History of narcotic addiction ([place]) [F11.21] 08/17/2014  . History of alcohol dependence ([place]) [F10.21] 08/17/2014    Total Time spent with patient: 1 hour  Subjective:   Gwendolyn Hansen is a 44 y.o. female patient admitted with "I'm confused".  HPI:  Information from the patient and the chart. Patient interviewed. Chart including old psychiatric Notes reviewed. Labs reviewed. Patient's commitment paperwork from her family reports that she has been behaving in a bizarre and erratic way at home and they do not think it's related to substance abuse. The patient is not a very good historian because of her disorganized thinking. She is able to tell me that her family thought she should have a "reassessment". Most questions however about symptoms result in confusing nonsensical answers. She does manage to say that she thought someone would hurt her but she can't give any more detail about that. Reports that she's been pulling out plugs of TV sets at home and that this has something to do with feeling like people didn't like her and might hurt her. I get the impression she is trying to describe ideas of reference. She has been sleeping very poorly at night. Doesn't do much of anything. Says she just sits around the  home since she's been back there. She admits that she continues to take her prescribed medicines including Adderall Valium and Subutex and Elavil but she has been out of the Subutex apparently for at least a few days. At least this is what I think she is saying. She denies any suicidal wish or homicidal ideation. She does seem to indicate that she's been having hallucinations and she is quite disorganized in her thinking. Can't articulate any other acute stressor. Not complaining of any specific physical problems.  Social history: Patient lives with her fianc and his brother. Has close contact apparently with her mother since her mother was very concerned about her behavior. Patient is not working outside the home. Has 2 children but they live elsewhere.  Medical history: Really no significant ongoing medical problems. Mild asthma possibly. No history of coronary artery disease or diabetes.  Substance abuse history: Past history of opiate abuse and apparently has been taking Suboxone for several years. She is not able to articulate giving me a real history of her past opiate abuse. More recently she has been getting prescriptions of Subutex Valium and Adderall from her primary care doctor. There was concern last year that she was having psychotic symptoms specifically related to Adderall abuse.  Past Psychiatric History: Patient has had at least 1 prior psychiatric admission here last February. She never went to follow-up with outpatient providers after that. She instead goes to see her primary care doctor who keeps her on Valium Subutex Elavil and Adderall. She denies any history of suicide attempts denies any history of violence.  Risk to Self: Suicidal  Ideation: No Suicidal Intent: No Is patient at risk for suicide?: No Suicidal Plan?: No Access to Means: No What has been your use of drugs/alcohol within the last 12 months?: Reports of having no use of any mind-altering substances How many  times?: 0 Other Self Harm Risks: None Reported Triggers for Past Attempts: Unknown Intentional Self Injurious Behavior: None Risk to Others: Homicidal Ideation: No Thoughts of Harm to Others: No Current Homicidal Intent: No Current Homicidal Plan: No Access to Homicidal Means: No Identified Victim: None Reported History of harm to others?: No Assessment of Violence: None Noted Violent Behavior Description: None Reported Does patient have access to weapons?: No Criminal Charges Pending?: No Does patient have a court date: No Prior Inpatient Therapy: Prior Inpatient Therapy: No Prior Therapy Dates: 02/2014 Prior Therapy Facilty/Provider(s): [facility] Reason for Treatment: Depression and substance use Prior Outpatient Therapy: Prior Outpatient Therapy: Yes Prior Therapy Dates: Currently Prior Therapy Facilty/Provider(s): [facility] Reason for Treatment: Depression Does patient have an ACCT team?: No Does patient have Intensive In-House Services?  : No Does patient have [facility] services? : No Does patient have P4CC services?: No  Past Medical History:  Past Medical History  Diagnosis Date  . Asthma   . Allergy     Past Surgical History  Procedure Laterality Date  . Tonsillectomy    . Breast enhancement surgery    . Ankle surgery    . Neck surgery     Family History:  Family History  Problem Relation Age of Onset  . COPD Mother   . Anxiety disorder Mother   . Emphysema Father   . Alcohol abuse Father   . Anxiety disorder Father   . Leukemia Father   . Bipolar disorder Sister   . Colon cancer Maternal Grandmother    Family Psychiatric  History: Patient says mental health problems "run in her family". She says she has close relatives with bipolar disorder. Social History:  History  Alcohol Use No    Comment: quit drinking in 2010     History  Drug Use No    Social History   Social History  . Marital Status: Legally Separated    Spouse Name: N/A  . Number of  Children: N/A  . Years of Education: N/A   Social History Main Topics  . Smoking status: Current Every Day Smoker -- 1.00 packs/day for 28 years    Types: Cigarettes  . Smokeless tobacco: Never Used  . Alcohol Use: No     Comment: quit drinking in 2010  . Drug Use: No  . Sexual Activity: Not on file   Other Topics Concern  . Not on file   Social History Narrative   Additional Social History:    Pain Medications: See PTA Prescriptions: See PTA Over the Counter: See PTA History of alcohol / drug use?: No history of alcohol / drug abuse Longest period of sobriety (when/how long): Reports of having no use Negative Consequences of Use:  (Reports of having no use) Withdrawal Symptoms:  (Reports of having no use)                     Allergies:  No Known Allergies  Labs:  Results for orders placed or performed during the hospital encounter of 01/15/15 (from the past 48 hour(s))  CBC     Status: Abnormal   Collection Time: 01/15/15 12:49 PM  Result Value Ref Range   WBC 5.7 3.6 - 11.0 K/uL   RBC  5.23 (H) 3.80 - 5.20 MIL/uL   Hemoglobin 15.5 12.0 - 16.0 g/dL   HCT 46.8 35.0 - 47.0 %   MCV 89.5 80.0 - 100.0 fL   MCH 29.6 26.0 - 34.0 pg   MCHC 33.1 32.0 - 36.0 g/dL   RDW 14.5 11.5 - 14.5 %   Platelets 282 150 - 440 K/uL  Urine Drug Screen, Qualitative ([facility] only)     Status: Abnormal   Collection Time: 01/15/15 12:49 PM  Result Value Ref Range   Tricyclic, Ur Screen POSITIVE (A) NONE DETECTED   Amphetamines, Ur Screen POSITIVE (A) NONE DETECTED   MDMA (Ecstasy)Ur Screen NONE DETECTED NONE DETECTED   Cocaine Metabolite,Ur [place] NONE DETECTED NONE DETECTED   Opiate, Ur Screen NONE DETECTED NONE DETECTED   Phencyclidine (PCP) Ur S NONE DETECTED NONE DETECTED   Cannabinoid 50 Ng, Ur [place] NONE DETECTED NONE DETECTED   Barbiturates, Ur Screen NONE DETECTED NONE DETECTED   Benzodiazepine, Ur Scrn POSITIVE (A) NONE DETECTED   Methadone Scn, Ur NONE DETECTED NONE DETECTED     Comment: (NOTE) 536  Tricyclics, urine               Cutoff 1000 ng/mL 200  Amphetamines, urine             Cutoff 1000 ng/mL 300  MDMA (Ecstasy), urine           Cutoff 500 ng/mL 400  Cocaine Metabolite, urine       Cutoff 300 ng/mL 500  Opiate, urine                   Cutoff 300 ng/mL 600  Phencyclidine (PCP), urine      Cutoff 25 ng/mL 700  Cannabinoid, urine              Cutoff 50 ng/mL 800  Barbiturates, urine             Cutoff 200 ng/mL 900  Benzodiazepine, urine           Cutoff 200 ng/mL 1000 Methadone, urine                Cutoff 300 ng/mL 1100 1200 The urine drug screen provides only a preliminary, unconfirmed 1300 analytical test result and should not be used for non-medical 1400 purposes. Clinical consideration and professional judgment should 1500 be applied to any positive drug screen result due to possible 1600 interfering substances. A more specific alternate chemical method 1700 must be used in order to obtain a confirmed analytical result.  1800 Gas chromato graphy / mass spectrometry (GC/MS) is the preferred 1900 confirmatory method.   Comprehensive metabolic panel     Status: Abnormal   Collection Time: 01/15/15 12:49 PM  Result Value Ref Range   Sodium 138 135 - 145 mmol/L   Potassium 4.2 3.5 - 5.1 mmol/L   Chloride 102 101 - 111 mmol/L   CO2 31 22 - 32 mmol/L   Glucose, Bld 112 (H) 65 - 99 mg/dL   BUN 6 6 - 20 mg/dL   Creatinine, Ser 0.82 0.44 - 1.00 mg/dL   Calcium 9.3 8.9 - 10.3 mg/dL   Total Protein 7.0 6.5 - 8.1 g/dL   Albumin 4.5 3.5 - 5.0 g/dL   AST 17 15 - 41 U/L   ALT 13 (L) 14 - 54 U/L   Alkaline Phosphatase 37 (L) 38 - 126 U/L   Total Bilirubin 0.3 0.3 - 1.2 mg/dL   GFR calc non Af  Amer >60 >60 mL/min   GFR calc Af Amer >60 >60 mL/min    Comment: (NOTE) The eGFR has been calculated using the CKD EPI equation. This calculation has not been validated in all clinical situations. eGFR's persistently <60 mL/min signify possible Chronic  Kidney Disease.    Anion gap 5 5 - 15  Pregnancy, urine POC     Status: None   Collection Time: 01/15/15  1:10 PM  Result Value Ref Range   Preg Test, Ur NEGATIVE NEGATIVE    Comment:        THE SENSITIVITY OF THIS METHODOLOGY IS >24 mIU/mL     Current Facility-Administered Medications  Medication Dose Route Frequency Provider Last Rate Last Dose  . amitriptyline (ELAVIL) tablet 100 mg  100 mg Oral QHS Lavonia Drafts, MD   100 mg at 01/15/15 2126  . buprenorphine (SUBUTEX) sublingual tablet 8 mg  8 mg Sublingual BID Lavonia Drafts, MD   8 mg at 01/16/15 0818  . multivitamin with minerals tablet 1 tablet  1 tablet Oral Once Ahmed Prima, MD   1 tablet at 01/15/15 1759  . QUEtiapine (SEROQUEL) tablet 50 mg  50 mg Oral QHS Ahmed Prima, MD   50 mg at 01/15/15 2242   Current Outpatient Prescriptions  Medication Sig Dispense Refill  . amitriptyline (ELAVIL) 100 MG tablet Take 100 mg by mouth at bedtime.    Marland Kitchen amphetamine-dextroamphetamine (ADDERALL) 30 MG tablet Take 30 mg by mouth 2 (two) times daily.    . buprenorphine (SUBUTEX) 8 MG SUBL SL tablet Place 8 mg under the tongue 2 (two) times daily.    . diazepam (VALIUM) 10 MG tablet Take 10 mg by mouth 3 (three) times daily.   0    Musculoskeletal: Strength & Muscle Tone: within normal limits Gait & Station: normal Patient leans: N/A  Psychiatric Specialty Exam: Review of Systems  Constitutional: Negative.   HENT: Negative.   Eyes: Negative.   Respiratory: Negative.   Cardiovascular: Negative.   Gastrointestinal: Negative.   Musculoskeletal: Negative.   Skin: Negative.   Neurological: Negative.   Psychiatric/Behavioral: Positive for hallucinations, memory loss and substance abuse. Negative for depression and suicidal ideas. The patient is nervous/anxious and has insomnia.     Blood pressure 123/87, pulse 70, temperature 97.6 F (36.4 C), temperature source Oral, resp. rate 20, height 5' 11"  (1.803 m), weight 81.647 kg  (180 lb), SpO2 97 %.Body mass index is 25.12 kg/(m^2).  General Appearance: Disheveled  Eye Sport and exercise psychologist::  Fair  Speech:  Slow  Volume:  Decreased  Mood:  Anxious  Affect:  Flat  Thought Process:  Disorganized  Orientation:  Negative  Thought Content:  Hallucinations: Auditory and Ideas of Reference:   Paranoia  Suicidal Thoughts:  No  Homicidal Thoughts:  No  Memory:  Immediate;   Fair Recent;   Poor Remote;   Poor  Judgement:  Impaired  Insight:  Shallow  Psychomotor Activity:  Psychomotor Retardation  Concentration:  Poor  Recall:  Poor  Fund of Knowledge:Poor  Language: Poor  Akathisia:  No  Handed:  Right  AIMS (if indicated):     Assets:  Physical Health  ADL's:  Intact  Cognition: Impaired,  Mild  Sleep:      Treatment Plan Summary: Daily contact with patient to assess and evaluate symptoms and progress in treatment, Medication management and Plan Patient continues to be disorganized and confused in her thinking. Appears to have paranoia about her family. Although she does not  have identifiable suicidal ideation or behavior she is clearly on functional and not thinking clearly and not able to follow suggestions. In the past it has appeared that this was related to substance abuse. At May still turn out to be the case although the family seems to feel like she is no longer abusing substances and still showing signs of psychosis. I will admit her to the hospital. She is under IVC. Start low dose Haldol. 15 minute checks in place.  Disposition: Recommend psychiatric Inpatient admission when medically cleared.  Teegan Guinther 01/16/2015 3:44 PM

## 2015-01-16 NOTE — Progress Notes (Signed)
Admission Note:  D: Pt appeared depressed  With  a flat affect. Pt is redirectable and cooperative with assessment.      A: Pt admitted to unit per protocol, skin assessment and search done and no contraband found.  Pt  educated on therapeutic milieu rules. Pt was introduced to milieu by nursing staff.    R: Pt was receptive to education about the milieu .  15 min safety checks started. Clinical research associatewriter offered support

## 2015-01-16 NOTE — Progress Notes (Addendum)
[facility] by Dr. Toni Amendlapacs. Attending Physician will be Dr. Jennet MaduroPucilowska.   Patient [facility]wen.  Intake Paper Work has been signed and placed on patient chart. ER staff is aware of the admission [facility]( [facility].; Dr. Murtis SinkWiliams, ER MD; Toniann FailWendy Patient's Nurse & Nedra HaiLee Patient Access).  Pt to be transfered after 4pm.   01/16/2015 Cheryl FlashNicole Caffie Sotto, MS, NCC, LPCA Therapeutic Triage Specialist

## 2015-01-16 NOTE — ED Notes (Signed)
Nurse sit and talked with patient, patient remains slow to respond and is disconnected to questions, Patient denies Si/Hi/ and AVH,patient states " I don't need to be here, but I know I have acted bad, Nurse reassured her that she was not bad, but that her behavior had been strange and Her family was worried about her" Patient with very flat affect, q 15 min checks and camera monitoring.

## 2015-01-16 NOTE — ED Notes (Signed)
Patient approached nurses station and complained of left arm pain.  When asked to qualify or quantify the pain, patient cannot/will not do either.  Patient states that she is just anxious and the bed is making her uncomfortable.  Patient's vital signs are taken and she is offered some water.  Patient then returns to bed and goes back to sleep.

## 2015-01-16 NOTE — ED Notes (Signed)
Patient remains flat affect, patient slow to respond, nurse administered magnesium citrate for constipation. Patient took without difficulty. Patient denies SI/HI.

## 2015-01-17 DIAGNOSIS — F19950 Other psychoactive substance use, unspecified with psychoactive substance-induced psychotic disorder with delusions: Secondary | ICD-10-CM | POA: Diagnosis present

## 2015-01-17 DIAGNOSIS — F411 Generalized anxiety disorder: Secondary | ICD-10-CM

## 2015-01-17 LAB — HEMOGLOBIN A1C: HEMOGLOBIN A1C: 5.5 % (ref 4.0–6.0)

## 2015-01-17 LAB — GLUCOSE, CAPILLARY: GLUCOSE-CAPILLARY: 86 mg/dL (ref 65–99)

## 2015-01-17 MED ORDER — HALOPERIDOL 5 MG PO TABS
5.0000 mg | ORAL_TABLET | Freq: Two times a day (BID) | ORAL | Status: DC
  Administered 2015-01-17 – 2015-01-18 (×2): 5 mg via ORAL
  Filled 2015-01-17 (×2): qty 1

## 2015-01-17 MED ORDER — CHLORDIAZEPOXIDE HCL 25 MG PO CAPS
25.0000 mg | ORAL_CAPSULE | Freq: Four times a day (QID) | ORAL | Status: DC
  Administered 2015-01-17 – 2015-01-18 (×5): 25 mg via ORAL
  Filled 2015-01-17 (×5): qty 1

## 2015-01-17 MED ORDER — BUPRENORPHINE HCL 2 MG SL SUBL
4.0000 mg | SUBLINGUAL_TABLET | Freq: Two times a day (BID) | SUBLINGUAL | Status: DC
  Administered 2015-01-17 – 2015-01-19 (×4): 4 mg via SUBLINGUAL
  Filled 2015-01-17 (×4): qty 2

## 2015-01-17 NOTE — [facility] Group Notes (Signed)
[facility] Group Notes:  (Nursing/MHT/Case Management/Adjunct)  Date:  01/17/2015  Time:  12:01 PM  Type of Therapy:  Psychoeducational Skills  Participation Level:  Did Not Attend  Gwendolyn Hansen 01/17/2015, 12:01 PM

## 2015-01-17 NOTE — H&P (Signed)
Psychiatric Admission Assessment Adult  Patient Identification: Gwendolyn Hansen MRN:  [id number] Date of Evaluation:  01/17/2015 Chief Complaint:  Depression Principal Diagnosis: <principal problem not specified> Diagnosis:   Patient Active Problem List   Diagnosis Date Noted  . 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 Tobacco use disorder [F17.200] 08/18/2014  . Neck pain [M54.2] 08/17/2014  . Ankle pain [M25.579] 08/17/2014  . GAD (generalized anxiety disorder) [F41.1] 08/17/2014  . Depression [F32.9] 08/17/2014  . History of narcotic addiction (HCC) [F11.21] 08/17/2014  . History of alcohol dependence (HCC) [F10.21] 08/17/2014   History of Present Illness:  Identifying data. Gwendolyn Hansen is a 44 year old female with a history of anxiety and opiate dependence.  Chief complaint. The patient is unable to state.  History of present illness. Information was obtained from the patient but mostly from the chart. Gwendolyn Hansen has a history of opiate dependence. She initially was addicted to prescription pills but for the past 7 years she has been maintained on Suboxone. Her dose was recently increased from 8 mg to 8 mg twice daily. The patient has also been prescribed Valium by the same provider to address anxiety. She is also given Aderrall to use as needed. There is also history of alcohol abuse. The patient was brought to the hospital by her family who noticed bizarre behaviors. The patient has recently been frightened believing that she is being watched, unplugging the TV for microwave. The family believes that her symptoms have gotten worse once she stopped abusing alcohol. It is unclear if the patient was compliant with medication. There is some indication that she did not take any prior to admission. The patient is not able to provide information at this point. She complains of anxiety. She denies symptoms of depression. She admits to paranoia. She denies  symptoms suggestive of bipolar mania.  Past psychiatric history. Apparently she has never been hospitalized.  Family psychiatric history. Unknown.  Social history. She lives with her boyfriend who pays for all her medications  and doctors visits as she is uninsured. She has supportive mother.  Total Time spent with patient: 1 hour  Past Psychiatric History: Anxiety on the dependence.  Risk to Self: Is patient at risk for suicide?: No Risk to Others:   Prior Inpatient Therapy:   Prior Outpatient Therapy:    Alcohol Screening: 1. How often do you have a drink containing alcohol?: Never 9. Have you or someone else been injured as a result of your drinking?: No 10. Has a relative or friend or a doctor or another health worker been concerned about your drinking or suggested you cut down?: No Alcohol Use Disorder Identification Test Final Score (AUDIT): 0 Brief Intervention: AUDIT score less than 7 or less-screening does not suggest unhealthy drinking-brief intervention not indicated Substance Abuse History in the last 12 months:  Yes.   Consequences of Substance Abuse: Negative Previous Psychotropic Medications: Yes  Psychological Evaluations: No  Past Medical History:  Past Medical History  Diagnosis Date  . Asthma   . Allergy     Past Surgical History  Procedure Laterality Date  . Tonsillectomy    . Breast enhancement surgery    . Ankle surgery    . Neck surgery     Family History:  Family History  Problem Relation Age of Onset  . COPD Mother   . Anxiety disorder Mother   . Emphysema Father   . Alcohol abuse Father   . Anxiety disorder Father   .  Leukemia Father   . Bipolar disorder Sister   . Colon cancer Maternal Grandmother    Family Psychiatric  History: Unknown. Social History:  History  Alcohol Use No    Comment: quit drinking in 2010     History  Drug Use No    Social History   Social History  . Marital Status: Legally Separated    Spouse Name: N/A   . Number of Children: N/A  . Years of Education: N/A   Social History Main Topics  . Smoking status: Current Every Day Smoker -- 1.00 packs/day for 28 years    Types: Cigarettes  . Smokeless tobacco: Never Used  . Alcohol Use: No     Comment: quit drinking in 2010  . Drug Use: No  . Sexual Activity: Not Asked   Other Topics Concern  . None   Social History Narrative   Additional Social History:                         Allergies:  No Known Allergies Lab Results:  Results for orders placed or performed during the hospital encounter of 01/16/15 (from the past 48 hour(s))  Hemoglobin A1c     Status: None   Collection Time: 01/16/15  7:55 PM  Result Value Ref Range   Hgb A1c MFr Bld 5.5 4.0 - 6.0 %  Lipid panel, fasting     Status: Abnormal   Collection Time: 01/16/15  7:55 PM  Result Value Ref Range   Cholesterol 202 (H) 0 - 200 mg/dL   Triglycerides 85 <161<150 mg/dL   HDL 47 >09>40 mg/dL   Total CHOL/HDL Ratio 4.3 RATIO   VLDL 17 0 - 40 mg/dL   LDL Cholesterol 604138 (H) 0 - 99 mg/dL    Comment:        Total Cholesterol/HDL:CHD Risk Coronary Heart Disease Risk Table                     Men   Women  1/2 Average Risk   3.4   3.3  Average Risk       5.0   4.4  2 X Average Risk   9.6   7.1  3 X Average Risk  23.4   11.0        Use the calculated Patient Ratio above and the CHD Risk Table to determine the patient's CHD Risk.        ATP III CLASSIFICATION (LDL):  <100     mg/dL   Optimal  540-981100-129  mg/dL   Near or Above                    Optimal  130-159  mg/dL   Borderline  191-478160-189  mg/dL   High  >295>190     mg/dL   Very High   TSH     Status: None   Collection Time: 01/16/15  7:55 PM  Result Value Ref Range   TSH 0.656 0.350 - 4.500 uIU/mL  Glucose, capillary     Status: Abnormal   Collection Time: 01/16/15  8:34 PM  Result Value Ref Range   Glucose-Capillary 107 (H) 65 - 99 mg/dL  Glucose, capillary     Status: None   Collection Time: 01/17/15  6:52 AM   Result Value Ref Range   Glucose-Capillary 86 65 - 99 mg/dL    Metabolic Disorder Labs:  Lab Results  Component Value Date  HGBA1C 5.5 01/16/2015   No results found for: PROLACTIN Lab Results  Component Value Date   CHOL 202* 01/16/2015   TRIG 85 01/16/2015   HDL 47 01/16/2015   CHOLHDL 4.3 01/16/2015   VLDL 17 01/16/2015   LDLCALC 138* 01/16/2015    Current Medications: Current Facility-Administered Medications  Medication Dose Route Frequency Provider Last Rate Last Dose  . acetaminophen (TYLENOL) tablet 650 mg  650 mg Oral Q6H PRN Audery Amel, MD      . alum & mag hydroxide-simeth (MAALOX/MYLANTA) 200-200-20 MG/5ML suspension 30 mL  30 mL Oral Q4H PRN Audery Amel, MD      . amitriptyline (ELAVIL) tablet 100 mg  100 mg Oral QHS Audery Amel, MD   100 mg at 01/16/15 2158  . buprenorphine (SUBUTEX) SL tablet 4 mg  4 mg Sublingual BID Dia Jefferys B Kyrus Hyde, MD      . chlordiazePOXIDE (LIBRIUM) capsule 25 mg  25 mg Oral QID Zaydenn Balaguer B Macel Yearsley, MD   25 mg at 01/17/15 1240  . haloperidol (HALDOL) tablet 5 mg  5 mg Oral BID Alta Shober B Jaia Alonge, MD      . magnesium hydroxide (MILK OF MAGNESIA) suspension 30 mL  30 mL Oral Daily PRN Audery Amel, MD       PTA Medications: Prescriptions prior to admission  Medication Sig Dispense Refill Last Dose  . amitriptyline (ELAVIL) 100 MG tablet Take 100 mg by mouth at bedtime.   unknown at unknown  . buprenorphine (SUBUTEX) 8 MG SUBL SL tablet Place 8 mg under the tongue 2 (two) times daily.   unknown at unknown  . diazepam (VALIUM) 10 MG tablet Take 10 mg by mouth 3 (three) times daily.   0 unknown at unknown    Musculoskeletal: Strength & Muscle Tone: within normal limits Gait & Station: normal Patient leans: N/A  Psychiatric Specialty Exam: Physical Exam  Nursing note and vitals reviewed.   Review of Systems  Psychiatric/Behavioral: Positive for substance abuse. The patient is nervous/anxious.   All other systems  reviewed and are negative.   Blood pressure 96/64, pulse 106, temperature 98.1 F (36.7 C), temperature source Oral, resp. rate 20, height 5\' 11"  (1.803 m), weight 78.472 kg (173 lb), SpO2 99 %.Body mass index is 24.14 kg/(m^2).  See SRA.                                                  Sleep:        Treatment Plan Summary: Daily contact with patient to assess and evaluate symptoms and progress in treatment and Medication management   Mrs. Scarola is a 44 year old female with history of anxiety and opiate dependence admitted for disorganized psychotic behavior in the context of misusing her prescribed Suboxone, Valium, and Adderall   1. Altered mental status. The patient is disorganized. Unable to answer simple questions. Oriented to person only. She is somewhat agitated and restless.  2. Opiate dependence. She is tearing me that her Suboxone was recently increased from 8 mg daily twice daily. We'll continue Suboxone 8 mg daily.  3. Anxiety. She has been maintained on Valium. She apparently has been misusing her medications. We will offer Librium taper.   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 Will did not offer stimulants here as it is likely that the  patient misuses them.   5. Paranoia. The patient has been behaving strangely at home believing that TV or microwave are somehow involved in spying on her. She was started on Haldol.  6. Substance abuse treatment. The patient is unable to have a discussion about it.  7. Disposition. She will likely be discharge with her boyfriend. She will follow up with a local provider.   Observation Level/Precautions:  15 minute checks  Laboratory:  CBC Chemistry Profile UDS UA  Psychotherapy:    Medications:    Consultations:    Discharge Concerns:    Estimated LOS:  Other:     I certify that inpatient services furnished can reasonably be expected to improve the patient's  condition.   Lynnleigh Soden 12/28/201612:47 PM

## 2015-01-17 NOTE — Tx Team (Signed)
Initial Interdisciplinary Treatment Plan   PATIENT STRESSORS: Substance abuse   PATIENT STRENGTHS: General fund of knowledge Supportive family/friends   PROBLEM LIST: Problem List/Patient Goals Date to be addressed Date deferred Reason deferred Estimated date of resolution  Psychosis 01/17/15                                                      DISCHARGE CRITERIA:  Improved stabilization in mood, thinking, and/or behavior  PRELIMINARY DISCHARGE PLAN: Outpatient therapy  PATIENT/FAMIILY INVOLVEMENT: This treatment plan has been presented to and reviewed with the patient, Gwendolyn Hansen, and/or family member.  The patient and family have been given the opportunity to ask questions and make suggestions.  Gwendolyn Hansen, Gwendolyn Hansen [facility] 01/17/2015, 6:21 AM

## 2015-01-17 NOTE — [facility] Group Notes (Signed)
[facility] LCSW Aftercare Discharge Planning Group Note  01/17/2015 9:15 AM  Participation Quality: Did Not Attend. Patient invited to participate but declined.   Marcena Dias F. Rayn Shorb, MSW, LCSWA, LCAS   

## 2015-01-17 NOTE — Progress Notes (Signed)
Recreation Therapy Notes  Date: 12.28.16 Time: 3:00 pm Location: Craft Room  Group Topic: Self-esteem  Goal Area(s) Addresses:  Patient will identify positive attributes about self. Patient will identify at least one coping skill.  Behavioral Response: Did not attend  Intervention: All About Me  Activity: Patients were instructed to make an "All About Me" pamphlet including their life's motto, positive traits, healthy coping skills, and their support system.  Education: LRT educated patients on ways they can increase their self-esteem.  Education Outcome: Patient did not attend group.  Clinical Observations/Feedback: Patient did not attend group.  Gwendolyn Hansen,Gwendolyn Hansen, LRT/CTRS 01/17/2015 4:20 PM

## 2015-01-17 NOTE — Progress Notes (Signed)
D:Affect flat and confused. Slow process of information received. No ADL'S or personal  Chores Patient very confused . Questioning why she is here and she did not need to be here. Reading through her manual  Misinterpreting the information.  Poor appetite . Noted to sleep  Long periods during shift.  .  No pain concerns . Limited  Interacting with peers and staff.  A: Encourage patient participation with unit programming . Instruction  Given on  Medication ,continue to emphasize understanding to patient . R: Voice no other concerns. Staff continue to monitor

## 2015-01-17 NOTE — Plan of Care (Signed)
Problem: Ineffective individual coping Goal: LTG: Patient will report a decrease in negative feelings Outcome: Not Progressing Isolative , limited interaction  With peers  flat

## 2015-01-17 NOTE — [facility] Group Notes (Signed)
[facility] LCSW Group Therapy   01/17/2015 1:15 pm  Type of Therapy: Group Therapy   Participation Level: Did Not Attend. Patient invited to participate but declined.    Gwendolyn Hansen F. Deeandra Jerry, MSW, LCSWA, LCAS   

## 2015-01-17 NOTE — [facility] Suicide Risk Assessment (Signed)
[facility] Admission Suicide Risk Assessment   Nursing information obtained from:  Patient Demographic factors:  Unemployed Current Mental Status:  NA Loss Factors:  NA Historical Factors:  NA Risk Reduction Factors:  Living with another person, especially a relative, Positive social support Total Time spent with patient: 1 hour Principal Problem: <principal problem not specified> Diagnosis:   Patient Active Problem List   Diagnosis Date Noted  . 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 Tobacco use disorder [F17.200] 08/18/2014  . Neck pain [M54.2] 08/17/2014  . Ankle pain [M25.579] 08/17/2014  . GAD (generalized anxiety disorder) [F41.1] 08/17/2014  . Depression [F32.9] 08/17/2014  . History of narcotic addiction (HCC) [F11.21] 08/17/2014  . History of alcohol dependence (HCC) [F10.21] 08/17/2014     Continued Clinical Symptoms:  Alcohol Use Disorder Identification Test Final Score (AUDIT): 0 The "Alcohol Use Disorders Identification Test", Guidelines for Use in Primary Care, Second Edition.  World Science writer [facility]). Score between 0-7:  no or low risk or alcohol related problems. Score between 8-15:  moderate risk of alcohol related problems. Score between 16-19:  high risk of alcohol related problems. Score 20 or above:  warrants further diagnostic evaluation for alcohol dependence and treatment.   CLINICAL FACTORS:   Severe Anxiety and/or Agitation Currently Psychotic   Musculoskeletal: Strength & Muscle Tone: within normal limits Gait & Station: normal Patient leans: N/A  Psychiatric Specialty Exam: Physical Exam  Nursing note and vitals reviewed. Constitutional: She appears well-developed and well-nourished.  HENT:  Head: Normocephalic and atraumatic.  Eyes: Conjunctivae and EOM are normal. Pupils are equal, round, and reactive to light.  Neck: Normal range of motion. Neck supple.  Cardiovascular: Normal rate, regular rhythm and  normal heart sounds.   Respiratory: Effort normal and breath sounds normal.  GI: Soft. Bowel sounds are normal.  Musculoskeletal: Normal range of motion.  Neurological: She is alert.  Skin: Skin is warm and dry.    Review of Systems  Psychiatric/Behavioral: Positive for memory loss and substance abuse. The patient is nervous/anxious.   All other systems reviewed and are negative.   Blood pressure 96/64, pulse 106, temperature 98.1 F (36.7 C), temperature source Oral, resp. rate 20, height  (1.803 m), weight 78.472 kg (173 lb), SpO2 99 %.Body mass index is 24.14 kg/(m^2).  General Appearance: Casual  Eye Contact::  Fair  Speech:  Normal Rate  Volume:  Normal  Mood:  Anxious  Affect:  Labile  Thought Process:  Disorganized  Orientation:  Other:  To person only  Thought Content:  WDL  Suicidal Thoughts:  No  Homicidal Thoughts:  No  Memory:  Immediate;   Poor Recent;   Poor Remote;   Poor  Judgement:  Poor  Insight:  Lacking  Psychomotor Activity:  Restlessness  Concentration:  Poor  Recall:  Poor  Fund of Knowledge:Poor  Language: Poor  Akathisia:  No  Handed:  Right  AIMS (if indicated):     Assets:  Desire for Improvement Financial Resources/Insurance  Sleep:     Cognition: WNL  ADL's:  Intact     COGNITIVE FEATURES THAT CONTRIBUTE TO RISK:  None    SUICIDE RISK:   Moderate:  Frequent suicidal ideation with limited intensity, and duration, some specificity in terms of plans, no associated intent, good self-control, limited dysphoria/symptomatology, some risk factors present, and identifiable protective factors, including available and accessible social support.  PLAN OF CARE: Hospital admission, medication management, absence abuse counseling, discharge planning.  Medical Decision Making:  New problem, with additional work up planned, Review of Psycho-Social Stressors (1), Review or order clinical lab tests (1), Review of Medication Regimen & Side Effects  (2) and Review of New Medication or Change in Dosage (2)   [name] is a 44 year old female with history of anxiety and opiate dependence admitted for disorganized psychotic behavior in the context of misusing her prescribed Suboxone, Valium, and Adderall   1. Altered mental status. The patient is disorganized. Unable to answer simple questions. Oriented to person only. She is somewhat agitated and restless.  2. Opiate dependence. She is tearing me that her Suboxone was recently increased from 8 mg daily twice daily. We'll continue Suboxone 8 mg daily.  3. Anxiety. She has been maintained on Valium. She apparently has been misusing her medications. We will offer Librium taper.   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 Will did not offer stimulants here as it is likely that the patient misuses them.   5. Paranoia. The patient has been behaving strangely at home believing that TV or microwave are somehow involved in spying on her. She was started on Haldol.  6. Substance abuse treatment. The patient is unable to have a discussion about it.  7. Disposition. She will likely be discharge with her boyfriend. She will follow up with a local provider.  I certify that inpatient services furnished can reasonably be expected to improve the patient's condition.   Rich Paprocki 01/17/2015, 12:39 PM

## 2015-01-18 MED ORDER — NICOTINE 21 MG/24HR TD PT24
21.0000 mg | MEDICATED_PATCH | Freq: Every day | TRANSDERMAL | Status: DC
  Administered 2015-01-19 – 2015-01-22 (×4): 21 mg via TRANSDERMAL
  Filled 2015-01-18 (×3): qty 1

## 2015-01-18 MED ORDER — HALOPERIDOL 2 MG PO TABS
2.0000 mg | ORAL_TABLET | Freq: Two times a day (BID) | ORAL | Status: DC
  Administered 2015-01-18 – 2015-01-22 (×8): 2 mg via ORAL
  Filled 2015-01-18 (×8): qty 1

## 2015-01-18 MED ORDER — CHLORDIAZEPOXIDE HCL 25 MG PO CAPS
50.0000 mg | ORAL_CAPSULE | Freq: Four times a day (QID) | ORAL | Status: DC
  Administered 2015-01-18 – 2015-01-19 (×4): 50 mg via ORAL
  Filled 2015-01-18 (×5): qty 2

## 2015-01-18 NOTE — Progress Notes (Signed)
[facility] MD Progress Note  01/18/2015 4:48 PM Gwendolyn Hansen  MRN:  [id number]  Subjective:  Ms. Ziska feels slightly better today but complains of symptoms of benzodiazepine withdrawals. She is slightly shaky and complains of anxiety. Her thinking is still disorganized. She does not know how she ended up in the hospital. She does not seem to be as paranoid as yesterday. Sleep and appetite are good. She participates in programming. She agrees to family meeting.  Principal Problem: Drug psychosis, with delusions (HCC) Diagnosis:   Patient Active Problem List   Diagnosis Date Noted  . 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 Tobacco use disorder [F17.200] 08/18/2014  . Neck pain [M54.2] 08/17/2014  . Ankle pain [M25.579] 08/17/2014  . GAD (generalized anxiety disorder) [F41.1] 08/17/2014  . Depression [F32.9] 08/17/2014  . Opioid use disorder, severe, in controlled environment, dependence (HCC) [F11.20] 08/17/2014  . History of alcohol dependence (HCC) [F10.21] 08/17/2014   Total Time spent with patient: 20 minutes  Past Psychiatric History:  Anxiety and opiate dependence.  Past Medical History:  Past Medical History  Diagnosis Date  . Asthma   . Allergy     Past Surgical History  Procedure Laterality Date  . Tonsillectomy    . Breast enhancement surgery    . Ankle surgery    . Neck surgery     Family History:  Family History  Problem Relation Age of Onset  . COPD Mother   . Anxiety disorder Mother   . Emphysema Father   . Alcohol abuse Father   . Anxiety disorder Father   . Leukemia Father   . Bipolar disorder Sister   . Colon cancer Maternal Grandmother    Family Psychiatric  History:  None reported. Social History:  History  Alcohol Use No    Comment: quit drinking in 2010     History  Drug Use No    Social History   Social History  . Marital Status: Legally Separated    Spouse  Name: N/A  . Number of Children: N/A  . Years of Education: N/A   Social History Main Topics  . Smoking status: Current Every Day Smoker -- 1.00 packs/day for 28 years    Types: Cigarettes  . Smokeless tobacco: Never Used  . Alcohol Use: No     Comment: quit drinking in 2010  . Drug Use: No  . Sexual Activity: Not Asked   Other Topics Concern  . None   Social History Narrative   Additional Social History:                         Sleep: Good  Appetite:  Good  Current Medications: Current Facility-Administered Medications  Medication Dose Route Frequency Provider Last Rate Last Dose  . acetaminophen (TYLENOL) tablet 650 mg  650 mg Oral Q6H PRN Audery Amel, MD      . alum & mag hydroxide-simeth (MAALOX/MYLANTA) 200-200-20 MG/5ML suspension 30 mL  30 mL Oral Q4H PRN Audery Amel, MD      . amitriptyline (ELAVIL) tablet 100 mg  100 mg Oral QHS Audery Amel, MD   100 mg at 01/17/15 2203  . buprenorphine (SUBUTEX) SL tablet 4 mg  4 mg Sublingual BID Shari Prows, MD   4 mg at 01/18/15 0856  . chlordiazePOXIDE (LIBRIUM) capsule 25 mg  25 mg Oral QID Shari Prows, MD  25 mg at 01/18/15 1431  . haloperidol (HALDOL) tablet 5 mg  5 mg Oral BID Shari ProwsJolanta B Valborg Friar, MD   5 mg at 01/18/15 0856  . magnesium hydroxide (MILK OF MAGNESIA) suspension 30 mL  30 mL Oral Daily PRN Audery AmelJohn T Clapacs, MD      . nicotine (NICODERM CQ - dosed in mg/24 hours) patch 21 mg  21 mg Transdermal Daily Shari ProwsJolanta B Aryanah Enslow, MD        Lab Results:  Results for orders placed or performed during the hospital encounter of 01/16/15 (from the past 48 hour(s))  Hemoglobin A1c     Status: None   Collection Time: 01/16/15  7:55 PM  Result Value Ref Range   Hgb A1c MFr Bld 5.5 4.0 - 6.0 %  Lipid panel, fasting     Status: Abnormal   Collection Time: 01/16/15  7:55 PM  Result Value Ref Range   Cholesterol 202 (H) 0 - 200 mg/dL   Triglycerides 85 <811<150 mg/dL   HDL 47 >91>40 mg/dL    Total CHOL/HDL Ratio 4.3 RATIO   VLDL 17 0 - 40 mg/dL   LDL Cholesterol 478138 (H) 0 - 99 mg/dL    Comment:        Total Cholesterol/HDL:CHD Risk Coronary Heart Disease Risk Table                     Men   Women  1/2 Average Risk   3.4   3.3  Average Risk       5.0   4.4  2 X Average Risk   9.6   7.1  3 X Average Risk  23.4   11.0        Use the calculated Patient Ratio above and the CHD Risk Table to determine the patient's CHD Risk.        ATP III CLASSIFICATION (LDL):  <100     mg/dL   Optimal  [id number]  mg/dL   Near or Above                    Optimal  130-159  mg/dL   Borderline  [id number]  mg/dL   High  >846>190     mg/dL   Very High   TSH     Status: None   Collection Time: 01/16/15  7:55 PM  Result Value Ref Range   TSH 0.656 0.350 - 4.500 uIU/mL  Glucose, capillary     Status: Abnormal   Collection Time: 01/16/15  8:34 PM  Result Value Ref Range   Glucose-Capillary 107 (H) 65 - 99 mg/dL  Glucose, capillary     Status: None   Collection Time: 01/17/15  6:52 AM  Result Value Ref Range   Glucose-Capillary 86 65 - 99 mg/dL    Physical Findings: AIMS: Facial and Oral Movements Muscles of Facial Expression: None, normal Lips and Perioral Area: None, normal Jaw: None, normal Tongue: None, normal,Extremity Movements Upper (arms, wrists, hands, fingers): None, normal Lower (legs, knees, ankles, toes): None, normal, Trunk Movements Neck, shoulders, hips: None, normal, Overall Severity Severity of abnormal movements (highest score from questions above): None, normal Incapacitation due to abnormal movements: None, normal Patient's awareness of abnormal movements (rate only patient's report): No Awareness, Dental Status Current problems with teeth and/or dentures?: No Does patient usually wear dentures?: No  CIWA:    COWS:     Musculoskeletal: Strength & Muscle Tone: within normal limits Gait & Station: normal Patient  leans: N/A  Psychiatric Specialty Exam: Review of  Systems  Neurological: Positive for tremors.  Psychiatric/Behavioral: The patient is nervous/anxious.   All other systems reviewed and are negative.   Blood pressure 78/55, pulse 116, temperature 98 F (36.7 C), temperature source Oral, resp. rate 20, height  (1.803 m), weight 78.472 kg (173 lb), SpO2 99 %.Body mass index is 24.14 kg/(m^2).  General Appearance: Casual  Eye Contact::  Good  Speech:  Clear and Coherent  Volume:  Normal  Mood:  Anxious  Affect:  Appropriate  Thought Process:  Disorganized  Orientation:  Full (Time, Place, and Person)  Thought Content:  Delusions and Paranoid Ideation  Suicidal Thoughts:  No  Homicidal Thoughts:  No  Memory:  Immediate;   Fair Recent;   Fair Remote;   Fair  Judgement:  Impaired  Insight:  Shallow  Psychomotor Activity:  Normal  Concentration:  Fair  Recall:  [employer] of Knowledge:Fair  Language: Fair  Akathisia:  No  Handed:  Right  AIMS (if indicated):     Assets:  Communication Skills Desire for Improvement Financial Resources/Insurance Housing Physical Health Resilience Social Support  ADL's:  Intact  Cognition: WNL  Sleep:  Number of Hours: 6.25   Treatment Plan Summary: Daily contact with patient to assess and evaluate symptoms and progress in treatment and Medication management   Mrs. Rummell is a 44 year old female with history of anxiety and opiate dependence admitted for disorganized psychotic behavior in the context of misusing her prescribed Suboxone, Valium, and Adderall   1. Altered mental status. The patient is still disorganized.   2. Opiate dependence. She is tearing me that her Suboxone was recently increased from 8 mg daily twice daily. We'll continue Suboxone 8 mg daily.  3. Anxiety. She has been maintained on Valium. She apparently has been misusing her medications. We will offer Librium taper. I wuill increase Librium to 50 mg.  4. Stimulant abuse. Reportedly her primary provider prescribes  all the medications. Will did not offer stimulants here as it is likely that the patient misuses them.   5. Paranoia. The patient has been behaving strangely at home believing that TV or microwave are somehow involved in spying on her. " No 100 mg twice daily.   6. Substance abuse treatment. The patient is unable to have a discussion about it.  7. Smoking. We offered NicoDerm patch.   8. Disposition. She will likely be discharge with her boyfriend. She will follow up with a local provider.   Meghen Akopyan 01/18/2015, 4:48 PM
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
## 2015-01-18 NOTE — Progress Notes (Signed)
D: Observed pt in room. Patient alert and oriented. Patient denies SI/HI/AVH. Pt affect is blunted, anxious and sad. Pt thought process is disorganized and circumstantial. Pt appears restless folding and refolding bedding during assessment. Pt's description of events leading to admission were hard to follow and disorganized. Pt endorses wanting to change and receive help. Pt describes feeling guilty "I'm in the hospital here when other people out there have real problems." Pt isolative to room this evening. Pt preoccupied with family health concerns and pt's folder.  A: Offered active listening and support. Provided therapeutic communication. Administered scheduled medications. Reinforced the importance and legitimacy of mental health needs and care. Encouraged pt to interact with peers and staff and attend groups.   R: Pt cooperative. Pt medication compliant. Will continue Q15 min. Checks. Safety maintained.

## 2015-01-18 NOTE — Tx Team (Signed)
Interdisciplinary Treatment Plan Update (Adult)         Date: 01/18/2015   Time Reviewed: 9:30 AM   Progress in Treatment: Improving Attending groups: Yes  Participating in groups: Yes  Taking medication as prescribed: Yes  Tolerating medication: Yes  Family/Significant other contact made: No, pt refused Patient understands diagnosis: Yes  Discussing patient identified problems/goals with staff: Yes  Medical problems stabilized or resolved: Yes  Denies suicidal/homicidal ideation: Yes  Issues/concerns per patient self-inventory: Yes  Other:   New problem(s) identified: N/A   Discharge Plan or Barriers: Pt plans to return home to live with her mother and fiance and seek medication management and therapy.   Reason for Continuation of Hospitalization:   Depression   Anxiety   Medication Stabilization    Estimated length of stay: 3-5 days  Comments:  Patient is a 44 year old female admitted for calling 911 and was brought to the ED by the police. Pt was at [facility] last year for problems with not taking her medications. Pt states she felt guilty about how much medications cost and did not take them as a result. Pt states her family observed her acting bizarrely and pt attributes this to her not taking her prescriptions in a correct manner.  Per ED notes the patient has recently been frightened believing that she is being watched, unplugging the TV for microwave. Pt reports she has a history of opiate dependence and initially was addicted to prescription pills but for the past 7 years she has been maintained on Suboxone. Pt was also prescribed methadone by [facility] in [place] while simultaneously taking benzodiazepenes and adderol that were prescribed to the pt. There is also history of cocaine and alcohol abuse . The pt states she has been confused lately and reports experiencing symptoms of anxiety. She denies symptoms of depression. Pt lives in  [place], [place]. Pt lists stressors as her mother suffering from cancer and being unemployed. Pt lists supports in the community as fiance and your, as well as friends. Patient will benefit from crisis stabilization, medication evaluation, group therapy, and psycho education in addition to case man       Review of initial/current patient goals per problem list:  1. Goal(s): Patient will participate in aftercare plan   Met: No  Target date: 3-5 days post admission date   As evidenced by: Patient will participate within aftercare plan AEB aftercare provider and housing plan at discharge being identified.   12/29: CSW assessing for appropriate contacts   3. Goal(s): Patient will demonstrate decreased signs and symptoms of anxiety.   Met: No  Target date: 3-5 days post admission date   As evidenced by: Patient will utilize self-rating of anxiety at 3 or below and demonstrated decreased signs of anxiety, or be deemed stable for discharge by MD     4. Goal(s): Patient will demonstrate decreased signs of withdrawal due to substance abuse   Met: Yes  Target date: 3-5 days post admission date   As evidenced by: Patient will produce a CIWA/COWS score of 0, have stable vitals signs, and no symptoms of withdrawal   12/29: Pt has produced a CIWA/COWS score of 0, have stable vitals signs, and no symptoms of withdrawal     5. Goal(s): Patient will demonstrate decreased signs of psychosis  * Met: No * Target date: 3-5 days post admission date  * As evidenced by: Patient will demonstrate decreased frequency of AVH or return to baseline function  12/29: Goal progressing     Attendees:  Patient:  Family:  Physician: Dr. Montel Culver, MD        01/18/2015 9:30 AM  Nursing: Silva Bandy,  RN          01/18/2015 9:30 AM  Clinical Social Worker: Marylou Flesher, [place]      01/18/2015 9:30 AM  Clincial: Social Worker: Candace Hyatt      01/18/2015 9:30 AM  Other:        01/18/2015 9:30 AM   Other:        01/18/2015 9:30 AM  Other:        01/18/2015 9:30 AM

## 2015-01-18 NOTE — [facility] Suicide Risk Assessment (Signed)
[facility] INPATIENT:  Family/Significant Other Suicide Prevention Education  Suicide Prevention Education:  Patient Refusal for Family/Significant Other Suicide Prevention Education: The patient Gwendolyn Hansen has refused to provide written consent for family/significant other to be provided Family/Significant Other Suicide Prevention Education during admission and/or prior to discharge.  Physician notified. CSW completed with pt.  Dorothe PeaJonathan F Brandon Scarbrough 01/18/2015, 3:50 PM

## 2015-01-18 NOTE — Plan of Care (Signed)
Problem: Ineffective individual coping Goal: STG: Patient will remain free from self harm Outcome: Progressing Pt remains free from self-harm and denies SI.     

## 2015-01-18 NOTE — [facility] Group Notes (Signed)
[facility] LCSW Group Therapy   01/18/2015 11:00 am   Type of Therapy: Group Therapy   Participation Level: Active   Participation Quality: Attentive, Sharing and Supportive   Affect: Appropriate   Cognitive: Alert and Oriented   Insight: Developing/Improving and Engaged   Engagement in Therapy: Developing/Improving and Engaged   Modes of Intervention: Clarification, Confrontation, Discussion, Education, Exploration, Limit-setting, Orientation, Problem-solving, Rapport Building, Dance movement psychotherapisteality Testing, Socialization and Support   Summary of Progress/Problems: The topic for group was balance in life. Today's group focused on defining balance in one's own words, identifying things that can knock one off balance, and exploring healthy ways to maintain balance in life. Group members were asked to provide an example of a time when they felt off balance, describe how they handled that situation, and process healthier ways to regain balance in the future. Group members were asked to share the most important tool for maintaining balance that they learned [facility] and how they plan to apply this method after discharge. Pt shared she previously maintained a proper balance in her life by spending time with her family.  Pt shares that when she stopped these activities problems began for her and that she is determined to remain focused on resuming these activities now and work on being more patient upon discharge.  Pt shared the most important tool available to her was family support. Pt was polite and cooperative with the CSW and other group members and focused and attentive to the topics discussed and the sharing of others.

## 2015-01-18 NOTE — Progress Notes (Addendum)
Patient with depressed affect, cooperative behavior with meals, meds and plan of care. NO SI/HI/AVH at this time. Patient states "I know I need to focus on myself and not my family" and that she is "trying to get straightened out off drugs and meds". Motivation is she "would like to be there for my Mother who is getting Radiation tx". Good adls, good appetite. Minimal interaction with peers, attends therapy groups and verbalizes needs appropriately with staff. Safety maintained. Completed self audit sheet appropriately.

## 2015-01-18 NOTE — [facility] Group Notes (Signed)
[facility] Group Notes:  (Nursing/MHT/Case Management/Adjunct)  Date:  01/18/2015  Time:  9:46 PM  Type of Therapy:  Group Therapy  Participation Level:  Active  Participation Quality:  Appropriate, Attentive and Sharing  Affect:  Appropriate  Cognitive:  Appropriate  Insight:  Appropriate  Engagement in Group:  Engaged  Modes of Intervention:  Support  Summary of Progress/Problems:  Gwendolyn ReddenOlivia Briana Hansen 01/18/2015, 9:46 PM

## 2015-01-18 NOTE — Plan of Care (Signed)
Problem: Ineffective individual coping Goal: STG: Pt will be able to identify effective and ineffective STG: Pt will be able to identify effective and ineffective coping patterns  Outcome: Progressing Patient actively participating in plan of care.

## 2015-01-19 MED ORDER — CHLORDIAZEPOXIDE HCL 25 MG PO CAPS
25.0000 mg | ORAL_CAPSULE | Freq: Four times a day (QID) | ORAL | Status: AC
  Administered 2015-01-19 (×2): 25 mg via ORAL
  Filled 2015-01-19: qty 1

## 2015-01-19 MED ORDER — HALOPERIDOL 2 MG PO TABS: 2.0000 mg | ORAL_TABLET | Freq: Every day | ORAL | Status: DC

## 2015-01-19 MED ORDER — BUPRENORPHINE HCL 2 MG SL SUBL
4.0000 mg | SUBLINGUAL_TABLET | Freq: Every day | SUBLINGUAL | Status: AC
  Administered 2015-01-19 – 2015-01-21 (×3): 4 mg via SUBLINGUAL
  Filled 2015-01-19 (×3): qty 2

## 2015-01-19 MED ORDER — AMITRIPTYLINE HCL 100 MG PO TABS: 100.0000 mg | ORAL_TABLET | Freq: Every day | ORAL | Status: DC

## 2015-01-19 NOTE — Progress Notes (Signed)
D:  Per pt self inventory pt reports sleeping good, appetite good, energy level normal, ability to pay attention good, rates depression at a 0 out of 10, hopelessness at a 0 out of 10, anxiety at a 0 out of 10, denies SI/HI/AVH, goal today:  "Relaxing, focus on me while my medicines are worked on to suit me,Talk to psychiatrist, going to groups, talking to others and listen."      A:  Emotional support provided, Encouraged pt to continue with treatment plan and attend all group activities, q15 min checks maintained for safety.  R:  Pt is receptive, going to groups, visible in milieu, states that she was feeling a little "loopy" this am, MD made aware, new order to decrease Librium dose, pleasant and cooperative with staff and other patients on the unit.

## 2015-01-19 NOTE — Progress Notes (Signed)
D: Observed pt interacting in day room. Patient alert and oriented x4. Patient denies SI/HI/AVH. Pt affect is blunted. Pt thought process much more organized than yesterday. Pt denies feeling anxious or depressed. Pt calm and pleasant. Pt stated "I've come a lot further than yesterday and the day before." Pt doesn't remember events leading up to admission, but was informed of them through family. Pt denies feeling paranoid and states "i want to get my meds right." Pt believes previous prescription of aderall might have caused erratic behavior. Pt family visited pt and agree pt appears much better than before admission.  A: Offered active listening and support. Provided therapeutic communication. Administered scheduled medications. Encouraged pt to interact with peers and staff and attend groups.Discussed pt medication with pt and family per pt request.  R: Pt calm and  cooperative. Pt medication compliant. Will continue Q15 min. Checks. Safety maintained.

## 2015-01-19 NOTE — Plan of Care (Signed)
Problem: Alteration in thought process Goal: LTG-Patient behavior demonstrates decreased signs psychosis (Patient behavior demonstrates decreased signs of psychosis to the point the patient is safe to return home and continue treatment in an outpatient setting.)  Outcome: Progressing Pt denies paranoia, content is logical and organized, pt interacting appropriately.

## 2015-01-19 NOTE — [facility] Group Notes (Signed)
[facility]   01/19/2015 9:15 AM  ?  Participation Quality: Alert, Appropriate and Oriented   Mood/Affect: Appropriate  Depression Rating: Pt did not report  Anxiety Rating: Pt did not report  Thoughts of Suicide: Pt denies SI/HI   Will you contract for safety? Yes   Current AVH: Pt denies   Plan for Discharge/Comments: Pt attended discharge planning group and actively participated in group. CSW provided pt with today's workbook. Pt reported she is safe for discharge and plans to return home and live with her fiance.  Transportation Means: Pt reports access to transportation   Supports: Pt lists mother and fiance as primary supports     Gwendolyn PeaJonathan F. Mariena Hansen, MSW, LCSWA, LCAS

## 2015-01-19 NOTE — Progress Notes (Signed)
[facility] MD Progress Note  01/19/2015 8:18 PM SULMA RUFFINO  MRN:  [id number]  Subjective:  Ms. Pippen is more coherent today. Paranoia has resolved. She tolerates detox well. Good program participation.  Principal Problem: Drug psychosis, with delusions (HCC) Diagnosis:   Patient Active Problem List   Diagnosis Date Noted  . 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 Tobacco use disorder [F17.200] 08/18/2014  . Neck pain [M54.2] 08/17/2014  . Ankle pain [M25.579] 08/17/2014  . GAD (generalized anxiety disorder) [F41.1] 08/17/2014  . Depression [F32.9] 08/17/2014  . Opioid use disorder, severe, in controlled environment, dependence (HCC) [F11.20] 08/17/2014  . History of alcohol dependence (HCC) [F10.21] 08/17/2014   Total Time spent with patient: 30 minutes  Past Psychiatric History: anxiety, substance use.  Past Medical History:  Past Medical History  Diagnosis Date  . Asthma   . Allergy     Past Surgical History  Procedure Laterality Date  . Tonsillectomy    . Breast enhancement surgery    . Ankle surgery    . Neck surgery     Family History:  Family History  Problem Relation Age of Onset  . COPD Mother   . Anxiety disorder Mother   . Emphysema Father   . Alcohol abuse Father   . Anxiety disorder Father   . Leukemia Father   . Bipolar disorder Sister   . Colon cancer Maternal Grandmother    Family Psychiatric  History: See H7P Social History:  History  Alcohol Use No    Comment: quit drinking in 2010     History  Drug Use No    Social History   Social History  . Marital Status: Legally Separated    Spouse Name: N/A  . Number of Children: N/A  . Years of Education: N/A   Social History Main Topics  . Smoking status: Current Every Day Smoker -- 1.00 packs/day for 28 years    Types: Cigarettes  . Smokeless tobacco: Never Used  . Alcohol Use: No     Comment: quit drinking in  2010  . Drug Use: No  . Sexual Activity: Not Asked   Other Topics Concern  . None   Social History Narrative   Additional Social History:                         Sleep: Fair  Appetite:  Fair  Current Medications: Current Facility-Administered Medications  Medication Dose Route Frequency Provider Last Rate Last Dose  . acetaminophen (TYLENOL) tablet 650 mg  650 mg Oral Q6H PRN Audery Amel, MD      . alum & mag hydroxide-simeth (MAALOX/MYLANTA) 200-200-20 MG/5ML suspension 30 mL  30 mL Oral Q4H PRN Audery Amel, MD      . amitriptyline (ELAVIL) tablet 100 mg  100 mg Oral QHS Audery Amel, MD   100 mg at 01/18/15 2114  . buprenorphine (SUBUTEX) SL tablet 4 mg  4 mg Sublingual Daily Jolanta B Pucilowska, MD      . chlordiazePOXIDE (LIBRIUM) capsule 25 mg  25 mg Oral QID Jolanta B Pucilowska, MD   25 mg at 01/19/15 1737  . haloperidol (HALDOL) tablet 2 mg  2 mg Oral BID Shari Prows, MD   2 mg at 01/19/15 0758  . magnesium hydroxide (MILK OF MAGNESIA) suspension 30 mL  30 mL Oral Daily [facility] Audery Amel, MD      .  nicotine (NICODERM CQ - dosed in mg/24 hours) patch 21 mg  21 mg Transdermal Daily Jolanta B Pucilowska, MD   21 mg at 01/19/15 0758    Lab Results: No results found for this or any previous visit (from the past 48 hour(s)).  Physical Findings: AIMS: Facial and Oral Movements Muscles of Facial Expression: None, normal Lips and Perioral Area: None, normal Jaw: None, normal Tongue: None, normal,Extremity Movements Upper (arms, wrists, hands, fingers): None, normal Lower (legs, knees, ankles, toes): None, normal, Trunk Movements Neck, shoulders, hips: None, normal, Overall Severity Severity of abnormal movements (highest score from questions above): None, normal Incapacitation due to abnormal movements: None, normal Patient's awareness of abnormal movements (rate only patient's report): No Awareness, Dental Status Current problems with teeth  and/or dentures?: No Does patient usually wear dentures?: No  CIWA:    COWS:     Musculoskeletal: Strength & Muscle Tone: within normal limits Gait & Station: normal Patient leans: N/A  Psychiatric Specialty Exam: Review of Systems  Cardiovascular: Positive for palpitations.  Psychiatric/Behavioral: The patient is nervous/anxious.   All other systems reviewed and are negative.   Blood pressure 120/87, pulse 72, temperature 98 F (36.7 C), temperature source Oral, resp. rate 20, height  (1.803 m), weight 78.472 kg (173 lb), SpO2 99 %.Body mass index is 24.14 kg/(m^2).  General Appearance: Casual  Eye Contact::  Good  Speech:  Slurred  Volume:  Normal  Mood:  Anxious  Affect:  Blunt  Thought Process:  Disorganized  Orientation:  Full (Time, Place, and Person)  Thought Content:  Delusions and Paranoid Ideation  Suicidal Thoughts:  No  Homicidal Thoughts:  No  Memory:  Immediate;   Fair Recent;   Fair Remote;   Fair  Judgement:  Fair  Insight:  Fair  Psychomotor Activity:  Normal  Concentration:  Fair  Recall:  [employer] of Knowledge:Fair  Language: Fair  Akathisia:  No  Handed:  Right  AIMS (if indicated):     Assets:  Communication Skills Desire for Improvement Financial Resources/Insurance Housing Intimacy Physical Health Resilience Social Support  ADL's:  Intact  Cognition: WNL  Sleep:  Number of Hours: 8.25   Treatment Plan Summary: Daily contact with patient to assess and evaluate symptoms and progress in treatment and Medication management   Mrs. Klausner is a 44 year old female with history of anxiety and opiate dependence admitted for disorganized psychotic behavior in the context of misusing her prescribed Suboxone, Valium, and Adderall   1. Altered mental status. The patient is more organized and ready to discuss treatment and discharge plan.    2. Opiate dependence. She is telling me that her Suboxone was recently increased from 8 mg daily to  twice daily. She now wants to discontinue Suboxone. Will give 4 mg daily for 3 doses.  3. Anxiety. She has been maintained on Valium. She apparently has been misusing her medications. We will offer Librium taper.   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 Will did not offer stimulants here as it is likely that the patient misuses them.   5. Paranoia. The patient has been behaving strangely at home believing that TV or microwave are somehow involved in spying on her. " No 100 mg twice daily.   6. Substance abuse treatment. The patient is ready to start SA IOP at [facility].   7. Smoking. We offered NicoDerm patch.   8. Disposition. She will likely be discharge with her boyfriend. She will  follow up with a local provider.   Jolanta Pucilowska 01/19/2015, 8:18 PM

## 2015-01-19 NOTE — Progress Notes (Signed)
D: Pt denies SI/HI/AVH. Pt is pleasant and cooperative, affect is bright, thoughts are sometimes disorganized, reoriented as needed. Patient appears less anxious and she is interacting with peers and staff appropriately.  A: Pt was offered support and encouragement. Pt was given scheduled medications. Pt was encouraged to attend groups. Q 15 minute checks were done for safety.  R:Pt attends groups and interacts well with peers and staff. Pt is taking medication. Pt has no complaints.Pt receptive to treatment and safety maintained on unit.

## 2015-01-19 NOTE — [facility] Group Notes (Signed)
[facility] Group Notes:  (Nursing/MHT/Case Management/Adjunct)  Date:  01/19/2015  Time:  11:46 AM  Type of Therapy:  Psychoeducational Skills  Participation Level:  Active  Participation Quality:  Appropriate, Attentive and Sharing  Affect:  Appropriate  Cognitive:  Alert and Appropriate  Insight:  Appropriate and Good  Engagement in Group:  Engaged  Modes of Intervention:  Discussion, Education and Support  Summary of Progress/Problems:  Gwendolyn Hansen 01/19/2015, 11:46 AM

## 2015-01-20 DIAGNOSIS — F19950 Other psychoactive substance use, unspecified with psychoactive substance-induced psychotic disorder with delusions: Secondary | ICD-10-CM

## 2015-01-20 LAB — HEPATITIS PANEL, ACUTE
HCV Ab: <0.1 s/co ratio (ref 0.0–0.9)
HEP B S AG: NEGATIVE
Hep A IgM: NEGATIVE
Hep B C IgM: NEGATIVE

## 2015-01-20 LAB — RPR: RPR: NONREACTIVE

## 2015-01-20 LAB — HIV ANTIBODY (ROUTINE TESTING W REFLEX): HIV SCREEN 4TH GENERATION: NONREACTIVE

## 2015-01-20 MED ORDER — DOCUSATE SODIUM 100 MG PO CAPS
100.0000 mg | ORAL_CAPSULE | Freq: Two times a day (BID) | ORAL | Status: DC
  Administered 2015-01-20 – 2015-01-22 (×4): 100 mg via ORAL
  Filled 2015-01-20 (×4): qty 1

## 2015-01-20 MED ORDER — MAGNESIUM CITRATE PO SOLN
1.0000 | Freq: Once | ORAL | Status: AC
  Administered 2015-01-20: 1 via ORAL
  Filled 2015-01-20: qty 296

## 2015-01-20 NOTE — Progress Notes (Signed)
[facility] MD Progress Note  01/20/2015 12:48 PM Gwendolyn Hansen  MRN:  [id number]  Subjective:  Patient has no new complaints today. She is much more coherent and is not reporting any psychotic symptoms or delusions. Mood is feeling better. Still somewhat nervous. Tolerating medication changes well. She is showing good insight into the contribution of her drug abuse to her mental health problems  Principal Problem: Drug psychosis, with delusions (HCC) Diagnosis:   Patient Active Problem List   Diagnosis Date Noted  . 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 Tobacco use disorder [F17.200] 08/18/2014  . Neck pain [M54.2] 08/17/2014  . Ankle pain [M25.579] 08/17/2014  . GAD (generalized anxiety disorder) [F41.1] 08/17/2014  . Depression [F32.9] 08/17/2014  . Opioid use disorder, severe, in controlled environment, dependence (HCC) [F11.20] 08/17/2014  . History of alcohol dependence (HCC) [F10.21] 08/17/2014   Total Time spent with patient: 30 minutes  Past Psychiatric History: anxiety, substance use.  Past Medical History:  Past Medical History  Diagnosis Date  . Asthma   . Allergy     Past Surgical History  Procedure Laterality Date  . Tonsillectomy    . Breast enhancement surgery    . Ankle surgery    . Neck surgery     Family History:  Family History  Problem Relation Age of Onset  . COPD Mother   . Anxiety disorder Mother   . Emphysema Father   . Alcohol abuse Father   . Anxiety disorder Father   . Leukemia Father   . Bipolar disorder Sister   . Colon cancer Maternal Grandmother    Family Psychiatric  History: See H7P Social History:  History  Alcohol Use No    Comment: quit drinking in 2010     History  Drug Use No    Social History   Social History  . Marital Status: Legally Separated    Spouse Name: N/A  . Number of Children: N/A  . Years of Education: N/A   Social History  Main Topics  . Smoking status: Current Every Day Smoker -- 1.00 packs/day for 28 years    Types: Cigarettes  . Smokeless tobacco: Never Used  . Alcohol Use: No     Comment: quit drinking in 2010  . Drug Use: No  . Sexual Activity: Not Asked   Other Topics Concern  . None   Social History Narrative   Additional Social History:                         Sleep: Fair  Appetite:  Fair  Current Medications: Current Facility-Administered Medications  Medication Dose Route Frequency Provider Last Rate Last Dose  . acetaminophen (TYLENOL) tablet 650 mg  650 mg Oral Q6H PRN Audery Amel, MD      . alum & mag hydroxide-simeth (MAALOX/MYLANTA) 200-200-20 MG/5ML suspension 30 mL  30 mL Oral Q4H PRN Audery Amel, MD      . amitriptyline (ELAVIL) tablet 100 mg  100 mg Oral QHS Audery Amel, MD   100 mg at 01/19/15 2141  . buprenorphine (SUBUTEX) SL tablet 4 mg  4 mg Sublingual Daily Jolanta B Pucilowska, MD   4 mg at 01/20/15 0915  . haloperidol (HALDOL) tablet 2 mg  2 mg Oral BID Shari Prows, MD   2 mg at 01/20/15 0915  . magnesium hydroxide (MILK OF MAGNESIA) suspension 30 mL  30 mL Oral Daily PRN Audery AmelJohn T Trivia Heffelfinger, MD      . nicotine (NICODERM CQ - dosed in mg/24 hours) patch 21 mg  21 mg Transdermal Daily Jolanta B Pucilowska, MD   21 mg at 01/20/15 [id number]    Lab Results:  Results for orders placed or performed during the hospital encounter of 01/16/15 (from the past 48 hour(s))  HIV antibody     Status: None   Collection Time: 01/19/15 12:43 PM  Result Value Ref Range   HIV Screen 4th Generation wRfx Non Reactive Non Reactive    Comment: (NOTE) Performed At: [facility] [address] [place], [place] [id number] Mila HomerHancock William F MD [phone]   RPR     Status: None   Collection Time: 01/19/15 12:43 PM  Result Value Ref Range   RPR Ser Ql Non Reactive Non Reactive    Comment: (NOTE) Performed At: [facility] [place] [address] [place], [place]  [id number] Mila HomerHancock William F MD ON:[phone]   Hepatitis panel, acute     Status: None   Collection Time: 01/19/15 12:43 PM  Result Value Ref Range   Hepatitis B Surface Ag Negative Negative   HCV Ab <0.1 0.0 - 0.9 s/co ratio    Comment: (NOTE)                                  Negative:     < 0.8                             Indeterminate: 0.8 - 0.9                                  Positive:     > 0.9 The CDC recommends that a positive HCV antibody result be followed up with a HCV Nucleic Acid Amplification test ([id number]). Performed At: [facility] [address] [place], [place] [id number] Mila HomerHancock William F MD [phone]    Hep A IgM Negative Negative   Hep B C IgM Negative Negative    Physical Findings: AIMS: Facial and Oral Movements Muscles of Facial Expression: None, normal Lips and Perioral Area: None, normal Jaw: None, normal Tongue: None, normal,Extremity Movements Upper (arms, wrists, hands, fingers): None, normal Lower (legs, knees, ankles, toes): None, normal, Trunk Movements Neck, shoulders, hips: None, normal, Overall Severity Severity of abnormal movements (highest score from questions above): None, normal Incapacitation due to abnormal movements: None, normal Patient's awareness of abnormal movements (rate only patient's report): No Awareness, Dental Status Current problems with teeth and/or dentures?: No Does patient usually wear dentures?: No  CIWA:    COWS:     Musculoskeletal: Strength & Muscle Tone: within normal limits Gait & Station: normal Patient leans: N/A  Psychiatric Specialty Exam: Review of Systems  Constitutional: Negative.   HENT: Negative.   Eyes: Negative.   Respiratory: Negative.   Cardiovascular: Negative.  Negative for palpitations.  Gastrointestinal: Negative.   Musculoskeletal: Negative.   Skin: Negative.   Neurological: Negative.   Psychiatric/Behavioral: The patient is not nervous/anxious.   All other systems  reviewed and are negative.   Blood pressure 85/52, pulse 92, temperature 98.1 F (36.7 C), temperature source Oral, resp. rate 18, height 5\' 11"  (1.803 m), weight 78.472 kg (173 lb), SpO2 99 %.Body mass index is 24.14 kg/(m^2).  General Appearance: Casual  Eye Contact::  Good  Speech:  Slurred  Volume:  Normal  Mood:  Anxious  Affect:  Blunt  Thought Process:  Disorganized  Orientation:  Full (Time, Place, and Person)  Thought Content:  Negative  Suicidal Thoughts:  No  Homicidal Thoughts:  No  Memory:  Immediate;   Fair Recent;   Fair Remote;   Fair  Judgement:  Fair  Insight:  Fair  Psychomotor Activity:  Normal  Concentration:  Fair  Recall:  [employer] of Knowledge:Fair  Language: Fair  Akathisia:  No  Handed:  Right  AIMS (if indicated):     Assets:  Communication Skills Desire for Improvement Financial Resources/Insurance Housing Intimacy Physical Health Resilience Social Support  ADL's:  Intact  Cognition: WNL  Sleep:  Number of Hours: 8   Treatment Plan Summary: Daily contact with patient to assess and evaluate symptoms and progress in treatment and Medication management   Mrs. Attwood is a 44 year old female with history of anxiety and opiate dependence admitted for disorganized psychotic behavior in the context of misusing her prescribed Suboxone, Valium, and Adderall   1. Altered mental status. Patient continues to show steady improvement and is lucid with no signs of delirium or psychosis today. 2. Opiate dependence. She is telling me that her Suboxone was recently increased from 8 mg daily to twice daily. She now wants to discontinue Suboxone. Will give 4 mg daily for 3 doses. Patient understands that she cannot receive any Suboxone at discharge. She is happy to continue with the taper of getting off of it  3. Anxiety. She has been maintained on Valium. She apparently has been misusing her medications. We will offer Librium taper.   4. Stimulant abuse.  Reportedly her primary provider prescribes all the medications. Will did not offer stimulants here as it is likely that the patient misuses them. Patient is showing improved understanding of how stimulants have contributed to her mental health problems  5. Paranoia. The patient has been behaving strangely at home believing that TV or microwave are somehow involved in spying on her. " No 100 mg twice daily. Psychosis in hospital largely resolved without any antipsychotic  6. Substance abuse treatment. The patient is ready to start SA IOP at [facility].   7. Smoking. We offered NicoDerm patch.   8. Disposition. She will likely be discharge with her boyfriend. She will follow up with a local provider. Continue current treatment plan. I advised patient that she probably still has a 1-2 daily stay to make sure that she is stable and coming off her controlled substances. She agrees to the plan.   Reona Zendejas 01/20/2015, 12:48 PM

## 2015-01-20 NOTE — Plan of Care (Signed)
Problem: Alteration in thought process Goal: LTG-Patient behavior demonstrates decreased signs psychosis (Patient behavior demonstrates decreased signs of psychosis to the point the patient is safe to return home and continue treatment in an outpatient setting.)  Outcome: Progressing Patient demonstrated decreased psychosis.       

## 2015-01-20 NOTE — Progress Notes (Signed)
Pt denies SI/HI/AVH. Patient pleasant during shift and medication compliant. Patient states that she is glad that she is being tapered off her medications. Patient goes back and forth between wanting to go home today or Monday. Patient attends NA group. Patient is medication compliant. Q 15 min checks maintained. Will continue to monitor.

## 2015-01-21 NOTE — Progress Notes (Signed)
Gwendolyn LodgeJuanita was pleasant on approach. She was cooperative with treatment and medication compliant. She was visible in the milieu and she interacted well with staff and peers. No behavioral issues to report on shift at this time.

## 2015-01-21 NOTE — Progress Notes (Signed)
[facility]  01/21/2015 3:49 PM Gwendolyn Hansen  MRN:  [id number]  Subjective:  Gwendolyn Hansen reports much improvement. She denies any symptoms of depression, anxiety, or psychosis. She denies symptoms of withdrawal from medication. She tolerated the detox well. She seems to be committed to sobriety and wants to follow up with [facility] SA IOP program. She is asking to be discharged tomorrow. She still seems a little disorganized for example she had difficulties completing free pharmacy application on Friday and today again. There are no behavioral problems.  Principal Problem: Drug psychosis, with delusions (HCC) Diagnosis:   Patient Active Problem List   Diagnosis Date Noted  . 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 Tobacco use disorder [F17.200] 08/18/2014  . Neck pain [M54.2] 08/17/2014  . Ankle pain [M25.579] 08/17/2014  . GAD (generalized anxiety disorder) [F41.1] 08/17/2014  . Depression [F32.9] 08/17/2014  . Opioid use disorder, severe, in controlled environment, dependence (HCC) [F11.20] 08/17/2014  . History of alcohol dependence (HCC) [F10.21] 08/17/2014   Total Time spent with patient: 20 minutes  Past Psychiatric History: Depression anxiety and substance abuse.  Past Medical History:  Past Medical History  Diagnosis Date  . Asthma   . Allergy     Past Surgical History  Procedure Laterality Date  . Tonsillectomy    . Breast enhancement surgery    . Ankle surgery    . Neck surgery     Family History:  Family History  Problem Relation Age of Onset  . COPD Mother   . Anxiety disorder Mother   . Emphysema Father   . Alcohol abuse Father   . Anxiety disorder Father   . Leukemia Father   . Bipolar disorder Sister   . Colon cancer Maternal Grandmother    Family Psychiatric  History: See H&P. Social History:  History  Alcohol Use No    Comment: quit drinking in 2010     History   Drug Use No    Social History   Social History  . Marital Status: Legally Separated    Spouse Name: N/A  . Number of Children: N/A  . Years of Education: N/A   Social History Main Topics  . Smoking status: Current Every Day Smoker -- 1.00 packs/day for 28 years    Types: Cigarettes  . Smokeless tobacco: Never Used  . Alcohol Use: No     Comment: quit drinking in 2010  . Drug Use: No  . Sexual Activity: Not Asked   Other Topics Concern  . None   Social History Narrative   Additional Social History:                         Sleep: Fair  Appetite:  Fair  Current Medications: Current Facility-Administered Medications  Medication Dose Route Frequency Provider Last Rate Last Dose  . acetaminophen (TYLENOL) tablet 650 mg  650 mg Oral Q6H PRN Audery AmelJohn T Clapacs, MD      . alum & mag hydroxide-simeth (MAALOX/MYLANTA) 200-200-20 MG/5ML suspension 30 mL  30 mL Oral Q4H PRN Audery AmelJohn T Clapacs, MD      . amitriptyline (ELAVIL) tablet 100 mg  100 mg Oral QHS Audery AmelJohn T Clapacs, MD   100 mg at 01/20/15 2211  . docusate sodium (COLACE) capsule 100 mg  100 mg Oral BID Audery AmelJohn T Clapacs, MD   100 mg at 01/21/15 0836  . haloperidol (HALDOL) tablet  2 mg  2 mg Oral BID Shari Prows, MD   2 mg at 01/21/15 0835  . magnesium hydroxide (MILK OF MAGNESIA) suspension 30 mL  30 mL Oral Daily PRN Audery Amel, MD      . nicotine (NICODERM CQ - dosed in mg/24 hours) patch 21 mg  21 mg Transdermal Daily Ireta Pullman B Maureen Duesing, MD   21 mg at 01/21/15 1610    Lab Results: No results found for this or any previous visit (from the past 48 hour(s)).  Physical Findings: AIMS: Facial and Oral Movements Muscles of Facial Expression: None, normal Lips and Perioral Area: None, normal Jaw: None, normal Tongue: None, normal,Extremity Movements Upper (arms, wrists, hands, fingers): None, normal Lower (legs, knees, ankles, toes): None, normal, Trunk Movements Neck, shoulders, hips: None, normal, Overall  Severity Severity of abnormal movements (highest score from questions above): None, normal Incapacitation due to abnormal movements: None, normal Patient's awareness of abnormal movements (rate only patient's report): No Awareness, Dental Status Current problems with teeth and/or dentures?: No Does patient usually wear dentures?: No  CIWA:    COWS:     Musculoskeletal: Strength & Muscle Tone: within normal limits Gait & Station: normal Patient leans: N/A  Psychiatric Specialty Exam: Review of Systems  All other systems reviewed and are negative.   Blood pressure 106/62, pulse 82, temperature 98.1 F (36.7 C), temperature source Oral, resp. rate 18, height 5\' 11"  (1.803 m), weight 78.472 kg (173 lb), SpO2 99 %.Body mass index is 24.14 kg/(m^2).  General Appearance: Casual  Eye Contact::  Good  Speech:  Clear and Coherent  Volume:  Normal  Mood:  Euthymic  Affect:  Appropriate  Thought Process:  Goal Directed  Orientation:  Full (Time, Place, and Person)  Thought Content:  WDL  Suicidal Thoughts:  No  Homicidal Thoughts:  No  Memory:  Immediate;   Fair Recent;   Fair Remote;   Fair  Judgement:  Fair  Insight:  Fair  Psychomotor Activity:  Normal  Concentration:  Fair  Recall:  [employer] of Knowledge:Fair  Language: Fair  Akathisia:  No  Handed:  Right  AIMS (if indicated):     Assets:  Communication Skills Desire for Improvement Financial Resources/Insurance Housing Intimacy Physical Health Resilience Social Support  ADL's:  Intact  Cognition: WNL  Sleep:  Number of Hours: 5   Treatment Plan Summary: Daily contact with patient to assess and evaluate symptoms and progress in treatment and Medication management   Gwendolyn Hansen is a 45 year old female with history of anxiety and opiate dependence admitted for disorganized psychotic behavior in the context of misusing her prescribed Suboxone, Valium, and Adderall   1. Altered mental status. Patient continues to  show steady improvement and is lucid with no signs of delirium or psychosis today.  2. Opiate dependence. She is telling me that her Suboxone was recently increased from 8 mg daily to twice daily. She now wants to discontinue Suboxone. Will give 4 mg daily for 3 doses. Patient understands that she cannot receive any Suboxone at discharge. She is happy to continue with the taper of getting off of it  3. Anxiety. She has been maintained on Valium. She apparently has been misusing her medications. We will offer Librium taper.   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 Will did not offer stimulants here as it is likely that the patient misuses them. Patient is showing improved understanding of how stimulants have contributed to  her mental health problems  5. Paranoia. This has resolved on low-dose Haldol.   6. Substance abuse treatment. The patient is ready to start SA IOP at [facility].   7. Smoking. We offered NicoDerm patch.   8. Disposition. She will likely be discharge with her boyfriend. She will follow up with a local provider. Continue current treatment plan. I advised patient that she probably still has a 1-2 daily stay to make sure that she is stable and coming off her controlled substances. She agrees to the plan.   Ife Vitelli 01/21/2015, 3:49 PM

## 2015-01-21 NOTE — Progress Notes (Signed)
Patient remained cooperative with routine, attended groups, denied SI/HI and AH. Per SW that held group this morning, patient appeared to be disorganized and had awkward answers to questions/discussion. Observed interacting well with peers. No concerns verbalized.

## 2015-01-21 NOTE — Plan of Care (Signed)
Problem: Ineffective individual coping Goal: STG: Patient will remain free from self harm Outcome: Progressing Patient denies thoughts of wanting to hurt herself or others.

## 2015-01-21 NOTE — Plan of Care (Signed)
Problem: Alteration in thought process Goal: LTG-Patient behavior demonstrates decreased signs psychosis (Patient behavior demonstrates decreased signs of psychosis to the point the patient is safe to return home and continue treatment in an outpatient setting.)  Outcome: Progressing Pt does not appear to be responding to internal stimuli. Denies AVH Goal: STG-Patient is able to follow short directions Outcome: Progressing Pt follows directions from staff appropriately

## 2015-01-21 NOTE — Progress Notes (Signed)
D: Pt is pleasant and cooperative this shift. Affect is bright. Denies SI/HI/AVH at this time. Denies pain. Pt seen in milieu interacting with peers appropriately. No concerns or complaints at this time. A: Emotional support and encouragement provided. Medications administered as prescribed. q15 minute safety checks maintained. R: Pt remains free from harm

## 2015-01-22 MED ORDER — AMITRIPTYLINE HCL 100 MG PO TABS: 100.0000 mg | ORAL_TABLET | Freq: Every day | ORAL | Status: DC

## 2015-01-22 MED ORDER — HALOPERIDOL 2 MG PO TABS: 2.0000 mg | ORAL_TABLET | Freq: Every day | ORAL | Status: DC

## 2015-01-22 NOTE — Discharge Summary (Addendum)
Physician Discharge Summary Note  Patient:  Gwendolyn Hansen is an 45 y.o., female MRN:  [id number] DOB:  07-02-1970 Patient phone:  [phone] (home)  Patient address:   [address]  [place] [address],  Total Time spent with patient: 30 minutes  Date of Admission:  01/16/2015 Date of Discharge: 01/22/2015  Reason for Admission:  Psychotic break.  Identifying data. Ms. [name] is a 45 year old female with a history of anxiety and opiate dependence.  Chief complaint. The patient is unable to state.  History of present illness. Information was obtained from the patient but mostly from the chart. [name] has a history of opiate dependence. She initially was addicted to prescription pills but for the past 7 years she has been maintained on Suboxone. Her dose was recently increased from 8 mg to 8 mg twice daily. The patient has also been prescribed Valium by the same provider to address anxiety. She is also given Aderrall to use as needed. There is also history of alcohol abuse. The patient was brought to the hospital by her family who noticed bizarre behaviors. The patient has recently been frightened believing that she is being watched, unplugging the TV for microwave. The family believes that her symptoms have gotten worse once she stopped abusing alcohol. It is unclear if the patient was compliant with medication. There is some indication that she did not take any prior to admission. The patient is not able to provide information at this point. She complains of anxiety. She denies symptoms of depression. She admits to paranoia. She denies symptoms suggestive of bipolar mania.  Past psychiatric history. Apparently she has never been hospitalized.  Family psychiatric history. Unknown.  Social history. She lives with her boyfriend who pays for all her medications and doctors visits as she is uninsured. She has supportive mother.  Principal Problem: Drug psychosis, with delusions  [facility]) Discharge Diagnoses: Patient Active Problem List   Diagnosis Date Noted  . 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 Tobacco use disorder [F17.200] 08/18/2014  . Neck pain [M54.2] 08/17/2014  . Ankle pain [M25.579] 08/17/2014  . GAD (generalized anxiety disorder) [F41.1] 08/17/2014  . Depression [F32.9] 08/17/2014  . Opioid use disorder, severe, in controlled environment, dependence (HCC) [F11.20] 08/17/2014  . History of alcohol dependence (HCC) [F10.21] 08/17/2014    Past Psychiatric History: Anxiety and substance use.  Past Medical History:  Past Medical History  Diagnosis Date  . Asthma   . Allergy     Past Surgical History  Procedure Laterality Date  . Tonsillectomy    . Breast enhancement surgery    . Ankle surgery    . Neck surgery     Family History:  Family History  Problem Relation Age of Onset  . COPD Mother   . Anxiety disorder Mother   . Emphysema Father   . Alcohol abuse Father   . Anxiety disorder Father   . Leukemia Father   . Bipolar disorder Sister   . Colon cancer Maternal Grandmother    Family Psychiatric  History: Mother with anxiety. Social History:  History  Alcohol Use No    Comment: quit drinking in 2010     History  Drug Use No    Social History   Social History  . Marital Status: Legally Separated    Spouse Name: N/A  . Number of Children: N/A  . Years of Education: N/A   Social History Main Topics  .  Smoking status: Current Every Day Smoker -- 1.00 packs/day for 28 years    Types: Cigarettes  . Smokeless tobacco: Never Used  . Alcohol Use: No     Comment: quit drinking in 2010  . Drug Use: No  . Sexual Activity: Not Asked   Other Topics Concern  . None   Social History Narrative    Hospital Course:    Mrs. Gwendolyn Hansen is a 45 year old female with history of anxiety and opiate dependence admitted for disorganized psychotic behavior in the  context of misusing her prescribed Suboxone, Valium, and Adderall   1. Altered mental status and paranoia. This has resolved with Haldol treatment.  2. Opiate dependence. The patient was treated with Suboxone in the community. This was gradually discontinued.   3. Anxiety. She has been maintained on Valium.  She completed Librium taper. This was uncomplicated detox. Vital signs were stable.   4. Stimulant abuse. We did not offer stimulants here as it is likely that the patient misuses them. Patient is showing improved understanding of how stimulants have contributed to her mental health problems  5. Substance abuse treatment. The patient is [facility].   6. Smoking. We offered NicoDerm patch.   7.  Metabolic syndrome. Lipid panel is slightly elevated. Hemoglobin A1c 5.5. The patient wants to start with healthy diet.   8. ID testing. HIV, RPR qand Hepatitis panel all negative.  9. Disposition. She was discharged to home. She will follow up with [facility].   Physical Findings: AIMS: Facial and Oral Movements Muscles of Facial Expression: None, normal Lips and Perioral Area: None, normal Jaw: None, normal Tongue: None, normal,Extremity Movements Upper (arms, wrists, hands, fingers): None, normal Lower (legs, knees, ankles, toes): None, normal, Trunk Movements Neck, shoulders, hips: None, normal, Overall Severity Severity of abnormal movements (highest score from questions above): None, normal Incapacitation due to abnormal movements: None, normal Patient's awareness of abnormal movements (rate only patient's report): No Awareness, Dental Status Current problems with teeth and/or dentures?: No Does patient usually wear dentures?: No  CIWA:    COWS:     Musculoskeletal: Strength & Muscle Tone: within normal limits Gait & Station: normal Patient leans: N/A  Psychiatric Specialty Exam: Review of Systems  All other systems reviewed and are negative.   Blood pressure  107/72, pulse 75, temperature 97.7 F (36.5 C), temperature source Oral, resp. rate 18, height 5\' 11"  (1.803 m), weight 78.472 kg (173 lb), SpO2 99 %.Body mass index is 24.14 kg/(m^2).  See SRA.                                                  Sleep:  Number of Hours: 5.75   Have you used any form of tobacco in the last 30 days? (Cigarettes, Smokeless Tobacco, Cigars, and/or Pipes): Yes  Has this patient used any form of tobacco in the last 30 days? (Cigarettes, Smokeless Tobacco, Cigars, and/or Pipes) Yes, Yes, A prescription for an FDA-approved tobacco cessation medication was offered at discharge and the patient refused  Metabolic Disorder Labs:  Lab Results  Component Value Date   HGBA1C 5.5 01/16/2015   No results found for: PROLACTIN Lab Results  Component Value Date   CHOL 202* 01/16/2015   TRIG 85 01/16/2015   HDL 47 01/16/2015   CHOLHDL 4.3 01/16/2015   VLDL  17 01/16/2015   LDLCALC 138* 01/16/2015    See Psychiatric Specialty Exam and Suicide Risk Assessment completed by Attending Physician prior to discharge.  Discharge destination:  Home  Is patient on multiple antipsychotic therapies at discharge:  No   Has Patient had three or more failed trials of antipsychotic monotherapy by history:  No  Recommended Plan for Multiple Antipsychotic Therapies: NA  Discharge Instructions    Diet - low sodium heart healthy    Complete by:  As directed      Diet - low sodium heart healthy    Complete by:  As directed      Increase activity slowly    Complete by:  As directed      Increase activity slowly    Complete by:  As directed             Medication List    STOP taking these medications        amphetamine-dextroamphetamine 30 MG tablet  Commonly known as:  ADDERALL     buprenorphine 8 MG Subl SL tablet  Commonly known as:  SUBUTEX     diazepam 10 MG tablet  Commonly known as:  VALIUM      TAKE these medications      Indication    amitriptyline 100 MG tablet  Commonly known as:  ELAVIL  Take 1 tablet (100 mg total) by mouth at bedtime.   Indication:  Trouble Sleeping     haloperidol 2 MG tablet  Commonly known as:  HALDOL  Take 1 tablet (2 mg total) by mouth at bedtime.   Indication:  Psychosis           Follow-up Information    Follow up with [facility].   Why:  Please arrive at the walk-in clinic between the hours of 8:30am-4:30pm with your application filled-out in order to set an appointment for an assessment for medication management and to receive a form that verifies financial support from others.   Contact information:   [address] [place], [place] [address] Ph: [phone] Fax: [phone]       Follow up with [facility] [place], [place].   Why:  Please arrive to the walk-in clinci between the hours of 8:00am-4pm Mon-Friday for medication management and therapy assessments.  Please call HARVEY BRYANT at [phone] after January 4th for question and/or assistance   Contact information:   [address] [place] [address] Ph: [phone] Fax: [phone]      Follow-up recommendations:  Activity:  As tolerated. Diet:  Low sodium heart healthy. Other:  Keep follow-up appointments.  Comments:  Okay.  Signed: Dorthey Depace 01/22/2015, 10:06 AM

## 2015-01-22 NOTE — Progress Notes (Signed)
Patient denies SI/HI, denies A/V hallucinations. Patient verbalizes understanding of discharge instructions, follow up care and prescriptions. Patient given all belongings from  locker. Patient escorted out by staff, transported by family. 

## 2015-01-22 NOTE — BHH Suicide Risk Assessment (Signed)
[facility] Discharge Suicide Risk Assessment   Demographic Factors:  Caucasian and Unemployed  Total Time spent with patient: 30 minutes  Musculoskeletal: Strength & Muscle Tone: within normal limits Gait & Station: normal Patient leans: N/A  Psychiatric Specialty Exam: Physical Exam  Nursing note and vitals reviewed.   Review of Systems  All other systems reviewed and are negative.   Blood pressure 107/72, pulse 75, temperature 97.7 F (36.5 C), temperature source Oral, resp. rate 18, height 5\' 11"  (1.803 m), weight 78.472 kg (173 lb), SpO2 99 %.Body mass index is 24.14 kg/(m^2).  General Appearance: Casual  Eye Contact::  Good  Speech:  Clear and Coherent409  Volume:  Normal  Mood:  Euthymic  Affect:  Appropriate  Thought Process:  Goal Directed  Orientation:  Full (Time, Place, and Person)  Thought Content:  WDL  Suicidal Thoughts:  No  Homicidal Thoughts:  No  Memory:  Immediate;   Fair Recent;   Fair Remote;   Fair  Judgement:  Fair  Insight:  Fair  Psychomotor Activity:  Normal  Concentration:  Fair  Recall:  [employer] of Knowledge:Fair  Language: Fair  Akathisia:  No  Handed:  Right  AIMS (if indicated):     Assets:  Communication Skills Desire for Improvement Financial Resources/Insurance Housing Intimacy Physical Health Resilience Social Support  Sleep:  Number of Hours: 5.75  Cognition: WNL  ADL's:  Intact   Have you used any form of tobacco in the last 30 days? (Cigarettes, Smokeless Tobacco, Cigars, and/or Pipes): Yes  Has this patient used any form of tobacco in the last 30 days? (Cigarettes, Smokeless Tobacco, Cigars, and/or Pipes) Yes, A prescription for an FDA-approved tobacco cessation medication was offered at discharge and the patient refused  Mental Status Per Nursing Assessment::   On Admission:  NA  Current Mental Status by Physician: NA  Loss Factors: NA  Historical Factors: Impulsivity  Risk Reduction Factors:   Sense of  responsibility to family, Living with another person, especially a relative and Positive social support  Continued Clinical Symptoms:  Alcohol/Substance Abuse/Dependencies  Cognitive Features That Contribute To Risk:  None    Suicide Risk:  Minimal: No identifiable suicidal ideation.  Patients presenting with no risk factors but with morbid ruminations; may be classified as minimal risk based on the severity of the depressive symptoms  Principal Problem: Drug psychosis, with delusions [facility]) Discharge Diagnoses:  Patient Active Problem List   Diagnosis Date Noted  . 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 Tobacco use disorder [F17.200] 08/18/2014  . Neck pain [M54.2] 08/17/2014  . Ankle pain [M25.579] 08/17/2014  . GAD (generalized anxiety disorder) [F41.1] 08/17/2014  . Depression [F32.9] 08/17/2014  . Opioid use disorder, severe, in controlled environment, dependence (HCC) [F11.20] 08/17/2014  . History of alcohol dependence (HCC) [F10.21] 08/17/2014    Follow-up Information    Follow up with [facility].   Why:  Please arrive at the walk-in clinic between the hours of 8:30am-4:30pm with your application filled-out in order to set an appointment for an assessment for medication management and to receive a form that verifies financial support from others.   Contact information:   [address] [place], [place] [address] Ph: [phone] Fax: [phone]       Plan Of Care/Follow-up recommendations:  Activity:  as tol;erated. Diet:  low sodium heart healthy. Other:  Keep follow up appointments.  Is patient on multiple antipsychotic therapies at discharge:  No   Has Patient had three or more failed trials of antipsychotic monotherapy by history:  No  Recommended Plan for Multiple Antipsychotic  Therapies: NA    Marilu Rylander 01/22/2015, 9:08 AM

## 2015-01-22 NOTE — Tx Team (Signed)
Interdisciplinary Treatment Plan Update (Adult)         Date: 01/22/2015   Time Reviewed: 9:30 AM   Progress in Treatment: Improving Attending groups: Yes  Participating in groups: Yes  Taking medication as prescribed: Yes  Tolerating medication: Yes  Family/Significant other contact made: No, pt refused Patient understands diagnosis: Yes  Discussing patient identified problems/goals with staff: Yes  Medical problems stabilized or resolved: Yes  Denies suicidal/homicidal ideation: Yes  Issues/concerns per patient self-inventory: Yes  Other:   New problem(s) identified: N/A   Discharge Plan or Barriers: Pt plans to return home to live with her mother and fiance and seek medication management and therapy with [facility] [place].   Reason for Continuation of Hospitalization:   Depression   Anxiety   Medication Stabilization    Estimated date of discharge: January 2nd, 2014  Comments:  Patient is a 45 year old female admitted for calling 911 and was brought to the ED by the police. Pt was at [facility] last year for problems with not taking her medications. Pt states she felt guilty about how much medications cost and did not take them as a result. Pt states her family observed her acting bizarrely and pt attributes this to her not taking her prescriptions in a correct manner.  Per ED notes the patient has recently been frightened believing that she is being watched, unplugging the TV for microwave. Pt reports she has a history of opiate dependence and initially was addicted to prescription pills but for the past 7 years she has been maintained on Suboxone. Pt was also prescribed methadone by [facility] in [place] while simultaneously taking benzodiazepenes and adderol that were prescribed to the pt. There is also history of cocaine and alcohol abuse . The pt states she has been confused lately and reports experiencing symptoms  of anxiety. She denies symptoms of depression. Pt lives in [place], [place]. Pt lists stressors as her mother suffering from cancer and being unemployed. Pt lists supports in the community as fiance, her mother, as well as friends. Patient will benefit from crisis stabilization, medication evaluation, group therapy, and psycho education in addition to case man       Review of initial/current patient goals per problem list:  1. Goal(s): Patient will participate in aftercare plan   Met: Yes  Target date: 3-5 days post admission date   As evidenced by: Patient will participate within aftercare plan AEB aftercare provider and housing plan at discharge being identified.   12/29: CSW assessing for appropriate contacts  01/02: Pt will follow up with outpatient services at [facility] and for assistance with her prescriptions at the [facility] clinic upon discharge  3. Goal(s): Patient will demonstrate decreased signs and symptoms of anxiety.   Met: Adequate for discharge per MD  Target date: 3-5 days post admission date   As evidenced by: Patient will utilize self-rating of anxiety at 3 or below and demonstrated decreased signs of anxiety, or be deemed stable for discharge by MD   12/29: Goal progressing  1/02: Adequate for discharge per MD.  Pt reports he she is safe for discharge.  Pt reports baseline symptoms of anxiety.    4. Goal(s): Patient will demonstrate decreased signs of withdrawal due to substance abuse   Met: Yes  Target date: 3-5 days post admission date   As evidenced by: Patient will produce a CIWA/COWS score of 0, have stable vitals signs, and no symptoms of withdrawal  12/29: Pt has produced a CIWA/COWS score of 0, have stable vitals signs, and no symptoms of withdrawal     5. Goal(s): Patient will demonstrate decreased signs of psychosis  * Met: Adequate for discharge per MD * Target date: 3-5 days post admission date  * As evidenced by: Patient will  demonstrate decreased frequency of AVH or return to baseline function   12/29: Goal progressing  1/02: Adequate for discharge per MD.   Pt reports she is safe for discharge.     Attendees:  Patient:  Family:  Physician: Dr. Jerilee Hoh, MD        01/22/2015 9:30 AM  Nursing: Silva Bandy,  RN          01/22/2015 9:30 AM  Clinical Social Worker: Valora Piccolo, LCSWA     01/22/2015 9:30 AM  Clincial: Social Worker: Marylou Flesher, [place] 01/22/2015 9:30 AM  Other:        01/22/2015 9:30 AM  Other:        01/22/2015 9:30 AM  Other:        01/22/2015 9:30 AM

## 2015-01-22 NOTE — [facility] Group Notes (Signed)
[facility] Group Notes:  (Nursing/MHT/Case Management/Adjunct)  Date:  01/22/2015  Time:  12:17 PM  Type of Therapy:  Psychoeducational Skills  Participation Level:  Active  Participation Quality:  Appropriate  Affect:  Appropriate  Cognitive:  Appropriate  Insight:  Appropriate  Engagement in Group:  Engaged  Modes of Intervention:  Discussion and Education  Summary of Progress/Problems:  Gwendolyn Hansen 01/22/2015, 12:17 PM

## 2015-01-22 NOTE — Progress Notes (Signed)
  [facility] Adult Case Management Discharge Plan :  Will you be returning to the same living situation after discharge:  Yes,  pt will return home to live with her fiance. At discharge, do you have transportation home?: Yes,  pt will be picked up by her mother Do you have the ability to pay for your medications: Yes,  pt will be provided with prescriptions at discharge.   Release of information consent forms completed and in the chart;  Patient's signature needed at discharge.  Patient to Follow up at: Follow-up Information    Follow up with [facility].   Why:  Please arrive at the walk-in clinic between the hours of 8:30am-4:30pm with your application filled-out in order to set an appointment for an assessment for medication management and to receive a form that verifies financial support from others.   Contact information:   [address], [address] [place], [place] [address] Ph: [phone] Fax: [phone]       Follow up with [facility] [place], [place].   Why:  Please arrive to the walk-in clinci between the hours of 8:00am-4pm Mon-Friday for medication management and therapy assessments.  Please call HARVEY BRYANT at [phone] after January 4th for question and/or assistance   Contact information:   [address] [place] [address] Ph: [phone] Fax: [phone]      Next level of care provider has access to [facility] Link:no  Safety Planning and Suicide Prevention discussed: Yes,  completed with pt  Have you used any form of tobacco in the last 30 days? (Cigarettes, Smokeless Tobacco, Cigars, and/or Pipes): Yes  Has patient been referred to the Quitline?: Patient refused referral  Patient has been referred for addiction treatment: Yes  Dorothe PeaJonathan F Patrick Salemi 01/22/2015, 10:36 AM

## 2015-02-22 ENCOUNTER — Other Ambulatory Visit: Payer: Self-pay | Admitting: Psychiatry

## 2015-02-26 ENCOUNTER — Other Ambulatory Visit: Payer: Self-pay | Admitting: Psychiatry

## 2015-08-20 ENCOUNTER — Encounter: Payer: Self-pay | Admitting: Physician Assistant

## 2015-08-20 NOTE — Progress Notes (Signed)
Patient: Gwendolyn Hansen, Female    DOB: 22-May-1970, 45 y.o.   MRN: [id number] Visit Date: 08/20/2015  Today's Provider: Margaretann Loveless, PA-C   No chief complaint on file.  Subjective:    Patient did not show for CPE  Review of Systems    Social History      She  reports that she has been smoking Cigarettes.  She has a 28.00 pack-year smoking history. She has never used smokeless tobacco. She reports that she does not drink alcohol or use drugs.       Social History   Social History  . Marital status: Legally Separated    Spouse name: N/A  . Number of children: N/A  . Years of education: N/A   Social History Main Topics  . Smoking status: Current Every Day Smoker    Packs/day: 1.00    Years: 28.00    Types: Cigarettes  . Smokeless tobacco: Never Used  . Alcohol use No     Comment: quit drinking in 2010  . Drug use: No  . Sexual activity: Not on file   Other Topics Concern  . Not on file   Social History Narrative  . No narrative on file    Past Medical History:  Diagnosis Date  . Allergy   . Asthma      Patient Active Problem List   Diagnosis Date Noted  . 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 Tobacco use disorder 08/18/2014  . Neck pain 08/17/2014  . Ankle pain 08/17/2014  . GAD (generalized anxiety disorder) 08/17/2014  . Depression 08/17/2014  . Opioid use disorder, severe, in controlled environment, dependence (HCC) 08/17/2014  . History of alcohol dependence (HCC) 08/17/2014    Past Surgical History:  Procedure Laterality Date  . ANKLE SURGERY    . BREAST ENHANCEMENT SURGERY    . NECK SURGERY    . TONSILLECTOMY      Family History        No family status information on file.        Her family history includes Alcohol abuse in her father; Anxiety disorder in her father and mother; Bipolar disorder in her sister; COPD in her mother; Colon cancer in her maternal  grandmother; Emphysema in her father; Leukemia in her father.    No Known Allergies  No outpatient prescriptions have been marked as taking for the 08/20/15 encounter (Appointment) with Margaretann Loveless, PA-C.    Patient Care Team: Margaretann Loveless, PA-C as PCP - General (Physician Assistant)     Objective:   Vitals: There were no vitals taken for this visit.   Physical Exam   Depression Screen PHQ 2/9 Scores 08/17/2014  PHQ - 2 Score 0      Assessment & Plan:     Routine Health Maintenance and Physical Exam  Exercise Activities and Dietary recommendations Goals    None       There is no immunization history on file for this patient.  Health Maintenance  Topic Date Due  . TETANUS/TDAP  07/10/1987  . INFLUENZA VACCINE  08/21/2015  . PAP SMEAR  08/16/2017  . HIV Screening  Completed      Discussed health benefits of physical activity, and encouraged her to engage in regular exercise appropriate for her age and condition.    --------------------------------------------------------------------    Margaretann Loveless, PA-C  [facility]

## 2018-08-22 ENCOUNTER — Encounter: Payer: Self-pay | Admitting: *Deleted

## 2018-08-22 ENCOUNTER — Emergency Department
Admission: EM | Admit: 2018-08-22 | Discharge: 2018-08-25 | Disposition: A | Discharge status: Home / Self Care | Payer: Self-pay | Attending: Emergency Medicine | Admitting: Emergency Medicine

## 2018-08-22 ENCOUNTER — Other Ambulatory Visit: Payer: Self-pay

## 2018-08-22 DIAGNOSIS — Z20828 Contact with and (suspected) exposure to other viral communicable diseases: Secondary | ICD-10-CM | POA: Insufficient documentation

## 2018-08-22 DIAGNOSIS — F17210 Nicotine dependence, cigarettes, uncomplicated: Secondary | ICD-10-CM | POA: Insufficient documentation

## 2018-08-22 DIAGNOSIS — F15959 Other stimulant use, unspecified with stimulant-induced psychotic disorder, unspecified: Secondary | ICD-10-CM | POA: Diagnosis present

## 2018-08-22 DIAGNOSIS — F29 Unspecified psychosis not due to a substance or known physiological condition: Secondary | ICD-10-CM

## 2018-08-22 DIAGNOSIS — Z79899 Other long term (current) drug therapy: Secondary | ICD-10-CM | POA: Insufficient documentation

## 2018-08-22 DIAGNOSIS — F23 Brief psychotic disorder: Secondary | ICD-10-CM | POA: Diagnosis present

## 2018-08-22 LAB — CBC WITH DIFFERENTIAL/PLATELET
Abs Immature Granulocytes: 0.03 10*3/uL (ref 0.00–0.07)
Basophils Absolute: 0 10*3/uL (ref 0.0–0.1)
Basophils Relative: 0 %
Eosinophils Absolute: 0 10*3/uL (ref 0.0–0.5)
Eosinophils Relative: 0 %
HCT: 45.5 % (ref 36.0–46.0)
Hemoglobin: 15.3 g/dL — ABNORMAL HIGH (ref 12.0–15.0)
Immature Granulocytes: 0 %
Lymphocytes Relative: 14 %
Lymphs Abs: 1.5 10*3/uL (ref 0.7–4.0)
MCH: 30.1 pg (ref 26.0–34.0)
MCHC: 33.6 g/dL (ref 30.0–36.0)
MCV: 89.6 fL (ref 80.0–100.0)
Monocytes Absolute: 0.8 10*3/uL (ref 0.1–1.0)
Monocytes Relative: 7 %
Neutro Abs: 8.6 10*3/uL — ABNORMAL HIGH (ref 1.7–7.7)
Neutrophils Relative %: 79 %
Platelets: 287 10*3/uL (ref 150–400)
RBC: 5.08 MIL/uL (ref 3.87–5.11)
RDW: 13 % (ref 11.5–15.5)
WBC: 11 10*3/uL — ABNORMAL HIGH (ref 4.0–10.5)
nRBC: 0 % (ref 0.0–0.2)

## 2018-08-22 LAB — URINE DRUG SCREEN, QUALITATIVE (ARMC ONLY)
Amphetamines, Ur Screen: NOT DETECTED
Barbiturates, Ur Screen: NOT DETECTED
Benzodiazepine, Ur Scrn: POSITIVE — AB
Cannabinoid 50 Ng, Ur ~~LOC~~: NOT DETECTED
Cocaine Metabolite,Ur ~~LOC~~: NOT DETECTED
MDMA (Ecstasy)Ur Screen: NOT DETECTED
Methadone Scn, Ur: NOT DETECTED
Opiate, Ur Screen: NOT DETECTED
Phencyclidine (PCP) Ur S: NOT DETECTED
Tricyclic, Ur Screen: POSITIVE — AB

## 2018-08-22 LAB — COMPREHENSIVE METABOLIC PANEL
ALT: 44 U/L (ref 0–44)
AST: 31 U/L (ref 15–41)
Albumin: 4.6 g/dL (ref 3.5–5.0)
Alkaline Phosphatase: 44 U/L (ref 38–126)
Anion gap: 9 (ref 5–15)
BUN: 12 mg/dL (ref 6–20)
CO2: 25 mmol/L (ref 22–32)
Calcium: 9.2 mg/dL (ref 8.9–10.3)
Chloride: 106 mmol/L (ref 98–111)
Creatinine, Ser: 0.76 mg/dL (ref 0.44–1.00)
GFR calc Af Amer: >60 mL/min (ref >60)
GFR calc non Af Amer: >60 mL/min (ref >60)
Glucose, Bld: 115 mg/dL — ABNORMAL HIGH (ref 70–99)
Potassium: 3.7 mmol/L (ref 3.5–5.1)
Sodium: 140 mmol/L (ref 135–145)
Total Bilirubin: 0.8 mg/dL (ref 0.3–1.2)
Total Protein: 7.2 g/dL (ref 6.5–8.1)

## 2018-08-22 LAB — URINALYSIS, ROUTINE W REFLEX MICROSCOPIC
Bilirubin Urine: NEGATIVE
Glucose, UA: NEGATIVE mg/dL
Hgb urine dipstick: NEGATIVE
Ketones, ur: 20 mg/dL — AB
Nitrite: NEGATIVE
Protein, ur: NEGATIVE mg/dL
Specific Gravity, Urine: 1.013 (ref 1.005–1.030)
pH: 6 (ref 5.0–8.0)

## 2018-08-22 LAB — ETHANOL: Alcohol, Ethyl (B): <10 mg/dL (ref <10)

## 2018-08-22 LAB — ACETAMINOPHEN LEVEL: Acetaminophen (Tylenol), Serum: <10 ug/mL — ABNORMAL LOW (ref 10–30)

## 2018-08-22 LAB — SALICYLATE LEVEL: Salicylate Lvl: <7 mg/dL (ref 2.8–30.0)

## 2018-08-22 LAB — AMMONIA: Ammonia: <9 umol/L — ABNORMAL LOW (ref 9–35)

## 2018-08-22 MED ORDER — BENZTROPINE MESYLATE 1 MG PO TABS
1.0000 mg | ORAL_TABLET | Freq: Every day | ORAL | Status: DC
  Administered 2018-08-22 – 2018-08-25 (×4): 1 mg via ORAL
  Filled 2018-08-22 (×4): qty 1

## 2018-08-22 MED ORDER — AMITRIPTYLINE HCL 50 MG PO TABS
100.0000 mg | ORAL_TABLET | Freq: Every day | ORAL | Status: DC
  Administered 2018-08-22 – 2018-08-24 (×3): 100 mg via ORAL
  Filled 2018-08-22: qty 1
  Filled 2018-08-22 (×2): qty 2
  Filled 2018-08-22: qty 1

## 2018-08-22 MED ORDER — CLONIDINE HCL 0.1 MG PO TABS
0.1000 mg | ORAL_TABLET | Freq: Every day | ORAL | Status: DC
  Administered 2018-08-22 – 2018-08-24 (×3): 0.1 mg via ORAL
  Filled 2018-08-22 (×3): qty 1

## 2018-08-22 MED ORDER — BUPRENORPHINE HCL 8 MG SL SUBL
8.0000 mg | SUBLINGUAL_TABLET | Freq: Every day | SUBLINGUAL | Status: DC
  Administered 2018-08-22 – 2018-08-25 (×4): 8 mg via SUBLINGUAL
  Filled 2018-08-22 (×3): qty 1

## 2018-08-22 MED ORDER — GABAPENTIN 100 MG PO CAPS
100.0000 mg | ORAL_CAPSULE | Freq: Three times a day (TID) | ORAL | Status: DC
  Administered 2018-08-22 – 2018-08-25 (×9): 100 mg via ORAL
  Filled 2018-08-22 (×13): qty 1

## 2018-08-22 MED ORDER — HALOPERIDOL 5 MG PO TABS
5.0000 mg | ORAL_TABLET | Freq: Two times a day (BID) | ORAL | Status: DC
  Administered 2018-08-23 – 2018-08-25 (×5): 5 mg via ORAL
  Filled 2018-08-22 (×5): qty 1

## 2018-08-22 MED ORDER — HALOPERIDOL 5 MG PO TABS
10.0000 mg | ORAL_TABLET | Freq: Once | ORAL | Status: AC
  Administered 2018-08-22: 18:00:00 10 mg via ORAL
  Filled 2018-08-22: qty 2

## 2018-08-22 MED ORDER — BUPRENORPHINE HCL-NALOXONE HCL 8-2 MG SL SUBL: 1.0000 | SUBLINGUAL_TABLET | Freq: Every day | SUBLINGUAL | Status: DC

## 2018-08-22 NOTE — ED Triage Notes (Addendum)
Pt presents via EMS for potential self-harm and acute psychosis. Pt is responsive to triage questions but is perseverative with regard to answers. Pt unable or unwilling to make any eye contact. Pt is disheveled. When asked about drinking her own urine, yesterday, pt replies "I was confused". Pt will exhibit some word salad. Pt also reiterates the date of today, her own name (maiden) and some other variable words that are out of context. Pt denies SI/HI @ this time. Pt cooperative with requests. Pt has not taken usual home meds since this past Friday per her report.

## 2018-08-22 NOTE — ED Notes (Signed)
Pt's sister, Gwenith Spitz, called. Sister confirmed pt has exhibited psychotic behaviors x 2 days. Pt wrote in a notebook, fragmented and disjointed. Pt washed a load of papers and jewelry in the washing machine. Pt hid cigarette lighter in back of toilet. Pt left the house last night w/o telling anyone where she was going and was gone for 20 minutes.

## 2018-08-22 NOTE — ED Notes (Signed)
Patient has been trying all the doors I the [facility] including the one to the adolescent area. Redirection minimally effective after several attempts.

## 2018-08-22 NOTE — ED Provider Notes (Signed)
[facility] Emergency Department Provider Note  ____________________________________________   First MD Initiated Contact with Patient 08/22/18 1539     (approximate)  I have reviewed the triage vital signs and the nursing notes.   HISTORY  Chief Complaint Paranoid    HPI Gwendolyn Hansen is a 48 y.o. female  With h/o polysubstance abuse, depression here with reported AMS. History severe limited 2/2 AMS. Per report from EMS, they were called to pt's house for psychotic behavior. On my assessment, pt admits to accidentally drinking her own urine this morning because she "got confused," but is perseverating on the date, paranoid, and states she is "just tired." Denies active drug use but per records, pt [facility]/ psychosis and paranoia, likely substance related. H/o abuse of Suboxone, valium, and adderrall.     Level 5 caveat invoked as remainder of history, ROS, and physical exam limited due to patient's mental status change.     Past Medical History:  Diagnosis Date  . Allergy   . Asthma     Patient Active Problem List   Diagnosis Date Noted  . 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 Tobacco use disorder 08/18/2014  . Neck pain 08/17/2014  . Ankle pain 08/17/2014  . GAD (generalized anxiety disorder) 08/17/2014  . Depression 08/17/2014  . Opioid use disorder, severe, in controlled environment, dependence (HCC) 08/17/2014  . History of alcohol dependence (HCC) 08/17/2014    Past Surgical History:  Procedure Laterality Date  . ANKLE SURGERY    . BREAST ENHANCEMENT SURGERY    . NECK SURGERY    . TONSILLECTOMY      Prior to Admission medications   Medication Sig Start Date End Date Taking? Authorizing Provider  amitriptyline (ELAVIL) 100 MG tablet Take 1 tablet (100 mg total) by mouth  at bedtime. 01/22/15   Pucilowska, Jolanta B, MD  buprenorphine (SUBUTEX) 8 MG SUBL SL tablet PLACE ONE TABLET UNDER THE TONGUE DAILY 08/16/15   [provider]  cloNIDine (CATAPRES) 0.1 MG tablet Take 0.1 mg by mouth at bedtime. 08/16/15   [provider]  diazepam (VALIUM) 10 MG tablet Take 10 mg by mouth 2 (two) times daily. 08/16/15   [provider]  haloperidol (HALDOL) 2 MG tablet Take 1 tablet (2 mg total) by mouth at bedtime. 01/22/15   Pucilowska, Ellin GoodieJolanta B, MD  levocetirizine (XYZAL) 5 MG tablet Take one tablet (5 mg total) by mouth every evening. 08/16/15   [provider]    Allergies Patient has no known allergies.  Family History  Problem Relation Age of Onset  . COPD Mother   . Anxiety disorder Mother   . Emphysema Father   . Alcohol abuse Father   . Anxiety disorder Father   . Leukemia Father   . Bipolar disorder Sister   . Colon cancer Maternal Grandmother     Social History Social History   Tobacco Use  . Smoking status: Current Every Day Smoker    Packs/day: 1.00    Years: 28.00    Pack years: 28.00    Types: Cigarettes  . Smokeless tobacco: Never Used  Substance Use Topics  . Alcohol use: No    Alcohol/week: 0.0 standard drinks    Comment: quit drinking in 2010  . Drug use: No    Review of Systems  Review of Systems  Unable to  perform ROS: Mental status change  All other systems reviewed and are negative.    ____________________________________________  PHYSICAL EXAM:      VITAL SIGNS: ED Triage Vitals  Enc Vitals Group     BP      Pulse      Resp      Temp      Temp src      SpO2      Weight      Height      Head Circumference      Peak Flow      Pain Score      Pain Loc      Pain Edu?      Excl. in GC?      Physical Exam Vitals signs and nursing note reviewed.  Constitutional:      General: She is not in acute distress.    Appearance: She is well-developed.  HENT:     Head: Normocephalic and  atraumatic.  Eyes:     Conjunctiva/sclera: Conjunctivae normal.  Neck:     Musculoskeletal: Neck supple.  Cardiovascular:     Rate and Rhythm: Normal rate and regular rhythm.     Heart sounds: Normal heart sounds.  Pulmonary:     Effort: Pulmonary effort is normal. No respiratory distress.     Breath sounds: No wheezing.  Abdominal:     General: There is no distension.  Skin:    General: Skin is warm.     Capillary Refill: Capillary refill takes less than 2 seconds.     Findings: No rash.  Neurological:     Mental Status: She is alert. She is disoriented and confused.     Motor: No abnormal muscle tone.  Psychiatric:        Mood and Affect: Affect is blunt.        Speech: Speech is slurred.        Thought Content: Thought content is paranoid.        Judgment: Judgment is impulsive and inappropriate.       ____________________________________________   LABS (all labs ordered are listed, but only abnormal results are displayed)  Labs Reviewed  CBC WITH DIFFERENTIAL/PLATELET - Abnormal; Notable for the following components:      Result Value   WBC 11.0 (*)    Hemoglobin 15.3 (*)    Neutro Abs 8.6 (*)    All other components within normal limits  COMPREHENSIVE METABOLIC PANEL - Abnormal; Notable for the following components:   Glucose, Bld 115 (*)    All other components within normal limits  AMMONIA - Abnormal; Notable for the following components:   Ammonia <9 (*)    All other components within normal limits  ACETAMINOPHEN LEVEL - Abnormal; Notable for the following components:   Acetaminophen (Tylenol), Serum <10 (*)    All other components within normal limits  ETHANOL  SALICYLATE LEVEL  URINE DRUG SCREEN, QUALITATIVE ([facility] ONLY)    ____________________________________________  EKG: Sinus tachycardia, ventricular rate 104.  PR 176, QRS 118, QTc 476.  No acute ischemic changes. ________________________________________  RADIOLOGY All imaging, including plain  films, CT scans, and ultrasounds, independently reviewed by me, and interpretations confirmed via formal radiology reads.  ED MD interpretation:   None  Official radiology report(s): No results found.  ____________________________________________  PROCEDURES   Procedure(s) performed (including Critical Care):  Procedures  ____________________________________________  INITIAL IMPRESSION / MDM / ASSESSMENT AND PLAN / ED COURSE  As part of my medical  decision making, I reviewed the following data within the [place] Notes from prior ED visits and [place] Controlled Substance Database      *Gwendolyn Hansen was evaluated in Emergency Department on 08/22/2018 for the symptoms described in the history of present illness. She was evaluated in the context of the global COVID-19 pandemic, which necessitated consideration that the patient might be at risk for infection with the SARS-CoV-2 virus that causes COVID-19. Institutional protocols and algorithms that pertain to the evaluation of patients at risk for COVID-19 are in a state of rapid change based on information released by regulatory bodies including the CDC and federal and state organizations. These policies and algorithms were followed during the patient's care in the ED.  Some ED evaluations and interventions may be delayed as a result of limited staffing during the pandemic.*      Medical Decision Making: 48 year old female here with paranoia and altered mental status.  She has a history of similar symptoms in the setting of polysubstance use.  No apparent organic etiology.  No focal neurological deficits.  Labs are reassuring.  Will consult TTS and psychiatry.  She has not taken her clonidine so I suspect her hypertension is related to this, will restart her dose here.  ____________________________________________  FINAL CLINICAL IMPRESSION(S) / ED DIAGNOSES  Final diagnoses:  Psychosis, unspecified psychosis type  ([place])     MEDICATIONS GIVEN DURING THIS VISIT:  Medications  cloNIDine (CATAPRES) tablet 0.1 mg (has no administration in time range)  amitriptyline (ELAVIL) tablet 100 mg (has no administration in time range)  haloperidol (HALDOL) tablet 5 mg (has no administration in time range)  benztropine (COGENTIN) tablet 1 mg (1 mg Oral Given 08/22/18 1807)  gabapentin (NEURONTIN) capsule 100 mg (100 mg Oral Given 08/22/18 1807)  buprenorphine (SUBUTEX) sublingual tablet 8 mg (8 mg Sublingual Given 08/22/18 1807)  haloperidol (HALDOL) tablet 10 mg (10 mg Oral Given 08/22/18 1807)     ED Discharge Orders    None       Note:  This document was prepared using Dragon voice recognition software and may include unintentional dictation errors.   Duffy Bruce, MD 08/22/18 [phone]

## 2018-08-22 NOTE — Progress Notes (Signed)
Subjective/Objective No new subjective & objective note has been filed under this hospital service since the last note was generated.   Scheduled Meds: . amitriptyline  100 mg Oral QHS  . buprenorphine-naloxone  1 tablet Sublingual Daily  . cloNIDine  0.1 mg Oral QHS   Continuous Infusions: PRN Meds:  Vital signs in last 24 hours: Temp:  [98.3 F (36.8 C)] 98.3 F (36.8 C) (08/02 1554) Pulse Rate:  [108] 108 (08/02 1554) Resp:  [20] 20 (08/02 1554) BP: (139)/(111) 139/111 (08/02 1554) SpO2:  [97 %] 97 % (08/02 1554)  Intake/Output last 3 shifts: No intake/output data recorded. Intake/Output this shift: No intake/output data recorded.  Problem Assessment/Plan No new Assessment & Plan notes have been filed under this hospital service since the last note was generated. Service: Psychiatry

## 2018-08-22 NOTE — ED Notes (Signed)
Pt encouraged to give urine sample. Hat placed on toilet and pt into bathroom to attempt urine sample.

## 2018-08-22 NOTE — ED Notes (Signed)
Hourly rounding reveals patient in room. No complaints, stable, in no acute distress. Q15 minute rounds and monitoring via Security Cameras to continue. 

## 2018-08-22 NOTE — ED Notes (Signed)
Black tennis shoes Romie Minus shorts [employer] Belt Black t-shirt Pink bra In p[ockets of shorts: 1 penny

## 2018-08-22 NOTE — ED Notes (Signed)
Pt. Transferred to [facility] from ED to room after screening for contraband. Report to include Situation, Background, Assessment and Recommendations from Ann RN. Pt. Oriented to unit including Q15 minute rounds as well as the security cameras for their protection. Patient is alert and oriented, warm and dry in no acute distress. Patient denies SI, HI, and AVH. Pt. Encouraged to let me know if needs arise.  

## 2018-08-22 NOTE — BH Assessment (Signed)
Assessment Note  Gwendolyn Hansen is an 48 y.o. female who presents to the ER after her mother had concerns about her current behaviors. Patient have been confused, disoriented and tangential and difficult to understand. Last night (08/21/2018), the mother walked in her room and she had a cup a urine to her mouth. The mother was unsure if she had drank it or not. She's been restless and pacing. "She go to sleep for thirty minutes and she's right back up..." Mother her behaviors started approximately four days ago. Each day it has worsened. Today, she was writing random words and names in notebook. She was holding her dog for the majority of the day and was refusing to let the dog go. "She just kept holding it and covering her eyes and saying it was blind..."  During the interview, the patient was able to answer small portion of the questions. She would start saying random words. Patient was able to to give name and date of birth. Patient was seen in the ER in 2016 for similar presentation. It was a result of her drug use.   Diagnosis: Unspecified Psychosis  Past Medical History:  Past Medical History:  Diagnosis Date  . Allergy   . Asthma     Past Surgical History:  Procedure Laterality Date  . ANKLE SURGERY    . BREAST ENHANCEMENT SURGERY    . NECK SURGERY    . TONSILLECTOMY      Family History:  Family History  Problem Relation Age of Onset  . COPD Mother   . Anxiety disorder Mother   . Emphysema Father   . Alcohol abuse Father   . Anxiety disorder Father   . Leukemia Father   . Bipolar disorder Sister   . Colon cancer Maternal Grandmother     Social History:  reports that she has been smoking cigarettes. She has a 28.00 pack-year smoking history. She has never used smokeless tobacco. She reports that she does not drink alcohol or use drugs.  Additional Social History:  Alcohol / Drug Use Pain Medications: See PTA Prescriptions: See PTA Over the Counter: See PTA History  of alcohol / drug use?: Yes Longest period of sobriety (when/how long): Unable to quantify Negative Consequences of Use: Personal relationships Substance #1 Name of Substance 1: Benzo Substance #2 Name of Substance 2: Methamphetamemes  CIWA: CIWA-Ar BP: (!) 139/111 Pulse Rate: (!) 108 COWS:    Allergies: No Known Allergies  Home Medications: (Not in a hospital admission)   OB/GYN Status:  No LMP recorded. Patient has had an injection.  General Assessment Data Location of Assessment: [facility] ED TTS Assessment: In system Is this a Tele or Face-to-Face Assessment?: Face-to-Face Is this an Initial Assessment or a Re-assessment for this encounter?: Initial Assessment Patient Accompanied by:: N/A Language Other than English: No Living Arrangements: Other (Comment)(Private Home) What gender do you identify as?: Female Marital status: Single Pregnancy Status: No Living Arrangements: Parent Can pt return to current living arrangement?: Yes Admission Status: Voluntary Is patient capable of signing voluntary admission?: Yes Referral Source: Self/Family/Friend Insurance type: None  Medical Screening Exam ([place]) Medical Exam completed: Yes  Crisis Care Plan Living Arrangements: Parent Legal Guardian: Other:(Self) Name of Psychiatrist: Reports of none Name of Therapist: Reports of none  Education Status Is patient currently in school?: No Is the patient employed, unemployed or receiving disability?: Unemployed  Risk to self with the past 6 months Suicidal Ideation: No Has patient been a risk to  self within the past 6 months prior to admission? : No Suicidal Intent: No Has patient had any suicidal intent within the past 6 months prior to admission? : No Is patient at risk for suicide?: No Suicidal Plan?: No Has patient had any suicidal plan within the past 6 months prior to admission? : No Access to Means: No What has been your use of drugs/alcohol within the  last 12 months?: Methamphetamines & Benzo's Previous Attempts/Gestures: No How many times?: 0 Other Self Harm Risks: Reports of none Triggers for Past Attempts: None known Intentional Self Injurious Behavior: None Family Suicide History: No Recent stressful life event(s): Other (Comment) Persecutory voices/beliefs?: No Depression: No Depression Symptoms: Isolating, Feeling worthless/self pity Substance abuse history and/or treatment for substance abuse?: No Suicide prevention information given to non-admitted patients: Not applicable  Risk to Others within the past 6 months Homicidal Ideation: No Does patient have any lifetime risk of violence toward others beyond the six months prior to admission? : No Thoughts of Harm to Others: No Current Homicidal Intent: No Current Homicidal Plan: No Access to Homicidal Means: No Identified Victim: Reports of none History of harm to others?: No Assessment of Violence: None Noted Violent Behavior Description: Reports of none Does patient have access to weapons?: No Criminal Charges Pending?: No Does patient have a court date: No Is patient on probation?: No  Psychosis Hallucinations: None noted Delusions: None noted  Mental Status Report Appearance/Hygiene: Unremarkable, In scrubs Eye Contact: Fair Motor Activity: Unable to assess(Patient is an adult) Speech: Soft, Pressured Level of Consciousness: Alert Mood: Labile Affect: Anxious, Labile Anxiety Level: None Thought Processes: Flight of Ideas, Irrelevant, Tangential Judgement: Impaired Orientation: Person, Place, Time, Situation Obsessive Compulsive Thoughts/Behaviors: Minimal  Cognitive Functioning Concentration: Decreased Memory: Recent Impaired, Remote Intact Is patient IDD: No Insight: Poor Impulse Control: Poor Appetite: Good Have you had any weight changes? : No Change Sleep: Decreased Total Hours of Sleep: 1 Vegetative Symptoms: None  ADLScreening [facility]  Assessment Services) Patient's cognitive ability adequate to safely complete daily activities?: Yes Patient able to express need for assistance with ADLs?: Yes Independently performs ADLs?: Yes (appropriate for developmental age)  Prior Inpatient Therapy Prior Inpatient Therapy: Yes Prior Therapy Dates: 12/2014 & 02/2014 [facility] Reason for Treatment: Drug induced psychosis  Prior Outpatient Therapy Prior Outpatient Therapy: No Does patient have an ACCT team?: No Does patient have Intensive In-House Services?  : No Does patient have [facility] services? : No Does patient have P4CC services?: No  ADL Screening (condition at time of admission) Patient's cognitive ability adequate to safely complete daily activities?: Yes Is the patient deaf or have difficulty hearing?: No Does the patient have difficulty seeing, even when wearing glasses/contacts?: No Does the patient have difficulty concentrating, remembering, or making decisions?: No Patient able to express need for assistance with ADLs?: Yes Does the patient have difficulty dressing or bathing?: No Independently performs ADLs?: Yes (appropriate for developmental age) Does the patient have difficulty walking or climbing stairs?: No Weakness of Legs: None Weakness of Arms/Hands: None  Home Assistive Devices/Equipment Home Assistive Devices/Equipment: None  Therapy Consults (therapy consults require a physician order) PT Evaluation Needed: No OT Evalulation Needed: No SLP Evaluation Needed: No Abuse/Neglect Assessment (Assessment to be complete while patient is alone) Abuse/Neglect Assessment Can Be Completed: Yes Physical Abuse: Denies Verbal Abuse: Denies Sexual Abuse: Denies Exploitation of patient/patient's resources: Denies Self-Neglect: Denies Values / Beliefs Cultural Requests During Hospitalization: None Spiritual Requests During Hospitalization: None Consults  Spiritual Care  Consult Needed: No Social Work Consult Needed: No Merchant navy officerAdvance Directives (For Healthcare) Does Patient Have a Medical Advance Directive?: No       Child/Adolescent Assessment Running Away Risk: Denies(Patient is an adult)  Disposition:  Disposition Initial Assessment Completed for this Encounter: Yes  On Site Evaluation by:   Reviewed with Physician:    Lilyan Gilfordalvin J. Jolynne Spurgin MS, LCAS, [facility], NCC, CCSI Therapeutic Triage Specialist 08/22/2018 6:16 PM

## 2018-08-22 NOTE — ED Notes (Signed)
Hourly rounding reveals patient sleeping in room. No complaints, stable, in no acute distress. Q15 minute rounds and monitoring via Security Cameras to continue. 

## 2018-08-23 ENCOUNTER — Ambulatory Visit (HOSPITAL_COMMUNITY)
Admission: EM | Admit: 2018-08-23 | Discharge: 2018-08-23 | Disposition: A | Discharge status: Home / Self Care | Payer: No Typology Code available for payment source | Source: Ambulatory Visit | Attending: Emergency Medicine | Admitting: Emergency Medicine

## 2018-08-23 DIAGNOSIS — Z0441 Encounter for examination and observation following alleged adult rape: Secondary | ICD-10-CM | POA: Diagnosis not present

## 2018-08-23 LAB — SARS CORONAVIRUS 2 BY RT PCR (HOSPITAL ORDER, PERFORMED IN ~~LOC~~ HOSPITAL LAB): SARS Coronavirus 2: NEGATIVE

## 2018-08-23 MED ORDER — DIAZEPAM 5 MG PO TABS
5.0000 mg | ORAL_TABLET | Freq: Two times a day (BID) | ORAL | Status: DC | PRN
  Administered 2018-08-23 – 2018-08-25 (×4): 5 mg via ORAL
  Filled 2018-08-23 (×4): qty 1

## 2018-08-23 NOTE — Consult Note (Addendum)
[facility]   Reason for Consult:  Bizarre behaviors Referring Physician:  EDP Patient Identification: Gwendolyn Hansen MRN:  [id number] Principal Diagnosis: Methamphetamine-induced psychotic disorder (HCC) Diagnosis:  Principal Problem:   Methamphetamine-induced psychotic disorder (HCC)  Total Time spent with patient: 1 hour  Subjective: Patient sates, "I was trying to go outside and smoke a cigarette, and then I ended up in an ambulance.".   Gwendolyn Hansen is a 48 y.o. female patient presenting to the ED with bizarre behavior and confusion. Patient with difficult to understand, disorganized, tangential speech, able to give name and date of birth. Patient maintained minimal to no eye contact during this assessment, continuously looking away from provider and hiding her face with her hair. Patient able to answer questions minimally, generally responding with one word coherent answers followed by a string of random words. Shortly after questioning patient fell asleep on bed. Patient with substance use history, namely methamphetamine and opioids, currently prescribed Buprenorphine and Valium. Consistently picking up Buprenorphine prescription. Patient denies any suicidal or homicidal ideation at the time of this assessment. Patient also denies any visual or auditory hallucinations at the time of this assessment.   Collateral from Mother per Counselor, Kathrynn RunningManning: Gwendolyn Hansen is an 48 y.o. female who presents to the ER after her mother had concerns about her current behaviors. Patient have been confused, disoriented and tangential and difficult to understand. Last night (08/21/2018), the mother walked in her room and she had a cup a urine to her mouth. The mother was unsure if she had drank it or not. She's been restless and pacing. "She go to sleep for thirty minutes and she's right back up..." Mother her behaviors started approximately four days ago. Each day it has  worsened. Today, she was writing random words and names in notebook. She was holding her dog for the majority of the day and was refusing to let the dog go. "She just kept holding it and covering her eyes and saying it was blind..."  Past Psychiatric History: Methamphetamine-induced psychotic disorder, anxiety, substance abuse  Risk to Self: Suicidal Ideation: No Suicidal Intent: No Is patient at risk for suicide?: No Suicidal Plan?: No Access to Means: No What has been your use of drugs/alcohol within the last 12 months?: Methamphetamines & Benzo's How many times?: 0 Other Self Harm Risks: Reports of none Triggers for Past Attempts: None known Intentional Self Injurious Behavior: None Risk to Others: Homicidal Ideation: No Thoughts of Harm to Others: No Current Homicidal Intent: No Current Homicidal Plan: No Access to Homicidal Means: No Identified Victim: Reports of none History of harm to others?: No Assessment of Violence: None Noted Violent Behavior Description: Reports of none Does patient have access to weapons?: No Criminal Charges Pending?: No Does patient have a court date: No Prior Inpatient Therapy: Prior Inpatient Therapy: Yes Prior Therapy Dates: 12/2014 & 02/2014 [facility] Reason for Treatment: Drug induced psychosis Prior Outpatient Therapy: Prior Outpatient Therapy: No Does patient have an ACCT team?: No Does patient have Intensive In-House Services?  : No Does patient have [facility] services? : No Does patient have P4CC services?: No  Past Medical History:  Past Medical History:  Diagnosis Date  . Allergy   . Asthma     Past Surgical History:  Procedure Laterality Date  . ANKLE SURGERY    . BREAST ENHANCEMENT SURGERY    . NECK SURGERY    . TONSILLECTOMY     Family History:  Family History  Problem Relation Age of Onset  . COPD Mother   . Anxiety disorder Mother   . Emphysema Father   . Alcohol abuse Father   .  Anxiety disorder Father   . Leukemia Father   . Bipolar disorder Sister   . Colon cancer Maternal Grandmother    Family Psychiatric  History: see above Social History:  Social History   Substance and Sexual Activity  Alcohol Use No  . Alcohol/week: 0.0 standard drinks   Comment: quit drinking in 2010     Social History   Substance and Sexual Activity  Drug Use No    Social History   Socioeconomic History  . Marital status: Legally Separated    Spouse name: Not on file  . Number of children: Not on file  . Years of education: Not on file  . Highest education level: Not on file  Occupational History  . Not on file  Social Needs  . Financial resource strain: Not on file  . Food insecurity    Worry: Not on file    Inability: Not on file  . Transportation needs    Medical: Not on file    Non-medical: Not on file  Tobacco Use  . Smoking status: Current Every Day Smoker    Packs/day: 1.00    Years: 28.00    Pack years: 28.00    Types: Cigarettes  . Smokeless tobacco: Never Used  Substance and Sexual Activity  . Alcohol use: No    Alcohol/week: 0.0 standard drinks    Comment: quit drinking in 2010  . Drug use: No  . Sexual activity: Not on file  Lifestyle  . Physical activity    Days per week: Not on file    Minutes per session: Not on file  . Stress: Not on file  Relationships  . Social Musicianconnections    Talks on phone: Not on file    Gets together: Not on file    Attends religious service: Not on file    Active member of club or organization: Not on file    Attends meetings of clubs or organizations: Not on file    Relationship status: Not on file  Other Topics Concern  . Not on file  Social History Narrative  . Not on file   Additional Social History:    Allergies:  No Known Allergies  Labs:  Results for orders placed or performed during the hospital encounter of 08/22/18 (from the past 48 hour(s))  CBC with Differential     Status: Abnormal    Collection Time: 08/22/18  3:55 PM  Result Value Ref Range   WBC 11.0 (H) 4.0 - 10.5 K/uL   RBC 5.08 3.87 - 5.11 MIL/uL   Hemoglobin 15.3 (H) 12.0 - 15.0 g/dL   HCT 86.545.5 78.436.0 - 69.646.0 %   MCV 89.6 80.0 - 100.0 fL   MCH 30.1 26.0 - 34.0 pg   MCHC 33.6 30.0 - 36.0 g/dL   RDW 29.513.0 28.411.5 - 13.215.5 %   Platelets 287 150 - 400 K/uL   nRBC 0.0 0.0 - 0.2 %   Neutrophils Relative % 79 %   Neutro Abs 8.6 (H) 1.7 - 7.7 K/uL   Lymphocytes Relative 14 %   Lymphs Abs 1.5 0.7 - 4.0 K/uL   Monocytes Relative 7 %   Monocytes Absolute 0.8 0.1 - 1.0 K/uL   Eosinophils Relative 0 %   Eosinophils Absolute 0.0 0.0 - 0.5 K/uL   Basophils  Relative 0 %   Basophils Absolute 0.0 0.0 - 0.1 K/uL   Immature Granulocytes 0 %   Abs Immature Granulocytes 0.03 0.00 - 0.07 K/uL    Comment: Performed at [facility], [address] [place], [place] [address]  Comprehensive metabolic panel     Status: Abnormal   Collection Time: 08/22/18  3:55 PM  Result Value Ref Range   Sodium 140 135 - 145 mmol/L   Potassium 3.7 3.5 - 5.1 mmol/L   Chloride 106 98 - 111 mmol/L   CO2 25 22 - 32 mmol/L   Glucose, Bld 115 (H) 70 - 99 mg/dL   BUN 12 6 - 20 mg/dL   Creatinine, Ser 1.910.76 0.44 - 1.00 mg/dL   Calcium 9.2 8.9 - 47.810.3 mg/dL   Total Protein 7.2 6.5 - 8.1 g/dL   Albumin 4.6 3.5 - 5.0 g/dL   AST 31 15 - 41 U/L   ALT 44 0 - 44 U/L   Alkaline Phosphatase 44 38 - 126 U/L   Total Bilirubin 0.8 0.3 - 1.2 mg/dL   GFR calc non Af Amer >60 >60 mL/min   GFR calc Af Amer >60 >60 mL/min   Anion gap 9 5 - 15    Comment: Performed at [facility], [address] [place], [place] [address]  Urine Drug Screen, Qualitative ([facility] only)     Status: Abnormal   Collection Time: 08/22/18  3:55 PM  Result Value Ref Range   Tricyclic, Ur Screen POSITIVE (A) NONE DETECTED   Amphetamines, Ur Screen NONE DETECTED NONE DETECTED   MDMA (Ecstasy)Ur Screen NONE DETECTED NONE DETECTED   Cocaine Metabolite,Ur [place] NONE DETECTED  NONE DETECTED   Opiate, Ur Screen NONE DETECTED NONE DETECTED   Phencyclidine (PCP) Ur S NONE DETECTED NONE DETECTED   Cannabinoid 50 Ng, Ur [place] NONE DETECTED NONE DETECTED   Barbiturates, Ur Screen NONE DETECTED NONE DETECTED   Benzodiazepine, Ur Scrn POSITIVE (A) NONE DETECTED   Methadone Scn, Ur NONE DETECTED NONE DETECTED    Comment: (NOTE) Tricyclics + metabolites, urine    Cutoff 1000 ng/mL Amphetamines + metabolites, urine  Cutoff 1000 ng/mL MDMA (Ecstasy), urine              Cutoff 500 ng/mL Cocaine Metabolite, urine          Cutoff 300 ng/mL Opiate + metabolites, urine        Cutoff 300 ng/mL Phencyclidine (PCP), urine         Cutoff 25 ng/mL Cannabinoid, urine                 Cutoff 50 ng/mL Barbiturates + metabolites, urine  Cutoff 200 ng/mL Benzodiazepine, urine              Cutoff 200 ng/mL Methadone, urine                   Cutoff 300 ng/mL The urine drug screen provides only a preliminary, unconfirmed analytical test result and should not be used for non-medical purposes. Clinical consideration and professional judgment should be applied to any positive drug screen result due to possible interfering substances. A more specific alternate chemical method must be used in order to obtain a confirmed analytical result. Gas chromatography / mass spectrometry (GC/MS) is the preferred confirmat ory method. Performed at [facility], [address] [place], [place] [address]   Ethanol     Status: None   Collection Time: 08/22/18  3:55 PM  Result Value Ref Range   Alcohol, Ethyl (B) <10 <10 mg/dL    Comment: (NOTE) Lowest detectable limit for serum alcohol is 10 mg/dL. For medical purposes only. Performed at [facility], [address] [place], [place] [address]   Ammonia     Status: Abnormal   Collection Time: 08/22/18  3:55 PM  Result Value Ref Range   Ammonia <9 (L) 9 - 35 umol/L    Comment: Performed at [facility], [address] [place], [place] [address]  Salicylate level     Status: None   Collection Time: 08/22/18  3:55 PM  Result Value Ref Range   Salicylate Lvl <7.0 2.8 - 30.0 mg/dL    Comment: Performed at [facility], [address] [place], [place] [address]  Acetaminophen level     Status: Abnormal   Collection Time: 08/22/18  3:55 PM  Result Value Ref Range   Acetaminophen (Tylenol), Serum <10 (L) 10 - 30 ug/mL    Comment: (NOTE) Therapeutic concentrations vary significantly. A range of 10-30 ug/mL  may be an effective concentration for many patients. However, some  are best treated at concentrations outside of this range. Acetaminophen concentrations >150 ug/mL at 4 hours after ingestion  and >50 ug/mL at 12 hours after ingestion are often associated with  toxic reactions. Performed at [facility], [address] [place], [place] [address]   Urinalysis, Routine w reflex microscopic     Status: Abnormal   Collection Time: 08/22/18  3:55 PM  Result Value Ref Range   Color, Urine YELLOW (A) YELLOW   APPearance CLEAR (A) CLEAR   Specific Gravity, Urine 1.013 1.005 - 1.030   pH 6.0 5.0 - 8.0   Glucose, UA NEGATIVE NEGATIVE mg/dL   Hgb urine dipstick NEGATIVE NEGATIVE   Bilirubin Urine NEGATIVE NEGATIVE   Ketones, ur 20 (A) NEGATIVE mg/dL   Protein, ur NEGATIVE NEGATIVE mg/dL   Nitrite NEGATIVE NEGATIVE   Leukocytes,Ua SMALL (A) NEGATIVE   RBC / HPF 0-5 0 - 5 RBC/hpf   WBC, UA 0-5 0 - 5 WBC/hpf   Bacteria, UA RARE (A) NONE SEEN   Squamous Epithelial / LPF 0-5 0 - 5   Mucus PRESENT     Comment: Performed at [facility], [address] [place], [place] [address]    Current Facility-Administered Medications  Medication Dose Route Frequency Provider Last Rate Last Dose  . amitriptyline (ELAVIL) tablet 100 mg  100 mg Oral QHS Charm Rings, NP   100 mg at 08/22/18 2120  . benztropine (COGENTIN) tablet 1 mg  1 mg Oral Daily Charm Rings, NP   1 mg at  08/22/18 1807  . buprenorphine (SUBUTEX) sublingual tablet 8 mg  8 mg Sublingual Daily Charm Rings, NP   8 mg at 08/22/18 1807  . cloNIDine (CATAPRES) tablet 0.1 mg  0.1 mg Oral QHS Charm Rings, NP   0.1 mg at 08/22/18 2120  . gabapentin (NEURONTIN) capsule 100 mg  100 mg Oral TID Charm Rings, NP   100 mg at 08/22/18 2120  . haloperidol (HALDOL) tablet 5 mg  5 mg Oral BID Charm Rings, NP       Current Outpatient Medications  Medication Sig Dispense Refill  . amitriptyline (ELAVIL) 100 MG tablet Take 1 tablet (100 mg total) by mouth at bedtime. 30 tablet 0  . buprenorphine (SUBUTEX) 8 MG SUBL SL tablet PLACE ONE TABLET UNDER THE TONGUE DAILY  0  . cloNIDine (CATAPRES) 0.1 MG tablet Take 0.1 mg by mouth at bedtime.  5  . diazepam (VALIUM) 10 MG tablet Take 10 mg by mouth 2 (two) times daily.  0  . haloperidol (HALDOL) 2 MG tablet Take 1 tablet (2 mg total) by mouth at bedtime. 30 tablet 0  . levocetirizine (XYZAL) 5 MG tablet Take one tablet (5 mg total) by mouth every evening.  11    Musculoskeletal: Strength & Muscle Tone: within normal limits Gait & Station: normal Patient leans: N/A  Psychiatric Specialty Exam: Physical Exam  Nursing note and vitals reviewed. Constitutional: She appears well-developed and well-nourished.  HENT:  Head: Normocephalic.  Neck: Normal range of motion.  Respiratory: Effort normal.  Musculoskeletal: Normal range of motion.  Neurological: She is alert.  Psychiatric: Her mood appears anxious. Her affect is blunt. Her speech is tangential. Thought content is paranoid and delusional. Cognition and memory are impaired. She expresses inappropriate judgment.  Patient with intermittently slurred speech, tangential, random nonsensical words and statements.  She is inattentive.    Review of Systems  Constitutional: Negative.   Eyes: Negative.   Respiratory: Negative.   Cardiovascular: Negative.   Gastrointestinal: Negative.   Genitourinary:  Negative.   Musculoskeletal: Negative.   Skin: Negative.   Neurological: Negative.   Endo/Heme/Allergies: Negative.   Psychiatric/Behavioral: Positive for memory loss and substance abuse. The patient is nervous/anxious.     Blood pressure (!) 127/92, pulse 89, temperature 98.3 F (36.8 C), temperature source Oral, resp. rate 18, SpO2 98 %.There is no height or weight on file to calculate BMI.  Eye Contact:  Minimal  Speech:  Normal  Volume:  Decreased  Mood:  Anxious  Affect:  Blunt  Thought Process:  Disorganized  Orientation:  Person and place  Thought Content:  Illogical  Suicidal Thoughts:  No  Homicidal Thoughts:  No  Memory:  Poor  Judgement:  Impaired  Insight:  Lacking  Psychomotor Activity:  Normal  Concentration:  Concentration: Poor and Attention Span: Poor  Recall:  [place] of Knowledge:  Fair  Language:  Fair  Akathisia:  No  Handed:  Right  AIMS (if indicated):     Assets:  Housing Leisure Time Physical Health Resilience Social Support  ADL's:  Intact  Cognition:  Impaired,  Mild  Sleep:        Treatment Plan Summary: Daily contact with patient to assess and evaluate symptoms and progress in treatment, Medication management and Plan continue to evaluate symptom progression   Opioid Dependence  -Buprenorphine 8 mg SL daily   Anxiety - Valium 5 mg BID   Methamphetamine-induced psychotic disorder - Haldol 5 mg BID -Gabapentin 100 mg TID   Insomnia -Amitriptyline 100 mg QH  EPS Prevention -Cogentin 1mg  daily   ADHD -Clonidine 0.1 mg QH  Disposition: Recommend psychiatric Inpatient admission when medically cleared.    Waylan Boga, NP 08/23/2018 7:44 AM

## 2018-08-23 NOTE — ED Notes (Signed)
Pt came to nurses station stating, "I put a governor on my emergency contacts. I don't know why I did that. I try to stay out of politics."

## 2018-08-23 NOTE — ED Notes (Addendum)
Pt went into another patient's room.  Seen immediately by RN and tech and removed from room. Patient told not to enter other patient's room.

## 2018-08-23 NOTE — Consult Note (Signed)
Unk Lightning, on coming SANE, notified of consult.

## 2018-08-23 NOTE — ED Notes (Signed)
[facility]/  PENDING  PLACEMENT 

## 2018-08-23 NOTE — ED Notes (Signed)
Hourly rounding reveals patient sleeping in room. No complaints, stable, in no acute distress. Q15 minute rounds and monitoring via Security Cameras to continue. 

## 2018-08-23 NOTE — ED Notes (Signed)
Patient talking on the phone with SANE nurse

## 2018-08-23 NOTE — ED Notes (Signed)
Pt walking around room talking to herself.  Pt has no needs at this time.

## 2018-08-23 NOTE — ED Notes (Signed)
Pt standing on her bed, wearing her bed sheets and blankets. Pt is reaching up to the ceiling and talking to herself.

## 2018-08-23 NOTE — ED Notes (Signed)
Nurse tech sitting outside patient's room per AD Marya Amsler.

## 2018-08-23 NOTE — ED Provider Notes (Signed)
-----------------------------------------   4:38 AM on 08/23/2018 -----------------------------------------   Blood pressure (!) 127/92, pulse 89, temperature 98.3 F (36.8 C), temperature source Oral, resp. rate 18, SpO2 98 %.  The patient is calm and cooperative at this time.  There have been no acute events since the last update.  Awaiting disposition plan from Behavioral Medicine and/or Social Work team(s).   Hinda Kehr, MD 08/23/18 [phone]
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
## 2018-08-23 NOTE — ED Notes (Signed)
Gave breakfast tray with juice. 
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
## 2018-08-23 NOTE — ED Notes (Signed)
Dr.Clapacs at bedside  
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
## 2018-08-24 ENCOUNTER — Encounter: Payer: Self-pay | Admitting: Emergency Medicine

## 2018-08-24 DIAGNOSIS — F23 Brief psychotic disorder: Secondary | ICD-10-CM | POA: Diagnosis present

## 2018-08-24 MED ORDER — IBUPROFEN 600 MG PO TABS
600.0000 mg | ORAL_TABLET | Freq: Once | ORAL | Status: AC
  Administered 2018-08-24: 600 mg via ORAL
  Filled 2018-08-24: qty 1

## 2018-08-24 NOTE — ED Notes (Signed)
Pt standing at door asking "Can I just turn around and walk out?" Pt made aware that she is under involuntary commitment.

## 2018-08-24 NOTE — ED Notes (Signed)
Hourly rounding reveals patient in room. No complaints, stable, in no acute distress. Q15 minute rounds and monitoring via Rover and Officer to continue.   

## 2018-08-24 NOTE — ED Notes (Signed)
Report to include Situation, Background, Assessment, and Recommendations received from [facility]. Patient alert, warm and dry, in no acute distress. Patient denies SI, HI, AVH and pain. Patient made aware of Q15 minute rounds and security cameras for their safety. Patient instructed to come to me with needs or concerns.

## 2018-08-24 NOTE — ED Notes (Addendum)
Pt mother called for update at this time, mother gave this RN pass code. This RN updated mother of pt pending admission and status.   Mother, Colon Branch [phone]

## 2018-08-24 NOTE — ED Notes (Signed)
Pt provided phone per request

## 2018-08-24 NOTE — ED Notes (Signed)
[facility]/  PENDING  PLACEMENT 
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
## 2018-08-24 NOTE — ED Notes (Signed)
Pt requesting new scrubs and socks, which were provided.  Pt wanting a shower.  It was explained to the patient that showers are done in the morning.  Pt expressed that alls she wants to do is go home.  Pt is upset that theres no one in the room to talk to.  Pt advised to try and get some sleep.  Changing into fresh scrubs, underwear and socks

## 2018-08-24 NOTE — ED Notes (Signed)
Pt given lunch tray.

## 2018-08-24 NOTE — ED Notes (Signed)
Pt continues to walk around her room and talking to herself.  She goes in and out of the bathroom every few minutes.  I have to knock on the door to get her to come out.  She asks for water and keeps spilling it on the floor and her bed.  Bedding changed.  Asked her to try and sleep.  She is now sitting on her bed eating crackers

## 2018-08-24 NOTE — ED Provider Notes (Signed)
-----------------------------------------   4:15 AM on 08/24/2018 -----------------------------------------   Blood pressure 134/90, pulse 86, temperature 98 F (36.7 C), temperature source Oral, resp. rate 18, SpO2 100 %.  The patient had no acute events since last update.  Calm and cooperative at this time.  Disposition is pending per Psychiatry/Behavioral Medicine team recommendations.     Alfred Levins, [place], MD 08/24/18 [phone]

## 2018-08-24 NOTE — ED Notes (Signed)
Report given to Gary RN 

## 2018-08-24 NOTE — ED Notes (Addendum)
Patient states she would like a shower. Patient informed she can have a shower in the a.m. Patient was agreeable with that information.

## 2018-08-24 NOTE — ED Notes (Signed)
Psychiatry to bedside at this time. 
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
## 2018-08-24 NOTE — ED Notes (Signed)
Pt asleep, breakfast tray placed in rm.  

## 2018-08-24 NOTE — BH Assessment (Signed)
Referral information for Psychiatric Hospitalization faxed to;   [facility] [phone]),   . [facility] [phone], [phone], [phone] or [phone]),   . [place] [facility] [phone] or [phone])  . [facility] [phone]),   . [facility] [phone] -or- [phone]),   . [facility] [phone] or [phone])

## 2018-08-24 NOTE — ED Notes (Signed)
Pt given phone for use at this time

## 2018-08-24 NOTE — ED Notes (Signed)
Pt cx of HA and back pain - 10/10. Pt is ambulating back and forth to the bathroom multiple times with a steady gait and can talk/eat graham crackers. Encouraged pt to allow [place] to turn lights down, but she declined.
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
## 2018-08-24 NOTE — ED Notes (Signed)
Pt continues to come out of room and walking in hallway. PT reminded to stay in her room for safety and health concerns with germs.

## 2018-08-25 MED ORDER — HALOPERIDOL 5 MG PO TABS: 5.0000 mg | ORAL_TABLET | Freq: Two times a day (BID) | ORAL | 0 refills | Status: DC

## 2018-08-25 MED ORDER — BENZTROPINE MESYLATE 1 MG PO TABS: 1.0000 mg | ORAL_TABLET | Freq: Every day | ORAL | 0 refills | Status: DC

## 2018-08-25 MED ORDER — GABAPENTIN 100 MG PO CAPS: 100.0000 mg | ORAL_CAPSULE | Freq: Three times a day (TID) | ORAL | 0 refills | Status: DC

## 2018-08-25 NOTE — ED Provider Notes (Signed)
-----------------------------------------   6:28 AM on 08/25/2018 -----------------------------------------   Blood pressure 121/80, pulse 79, temperature 98.1 F (36.7 C), temperature source Oral, resp. rate 15, SpO2 98 %.  The patient is calm and cooperative at this time.  There have been no acute events since the last update.  Ambulated to restroom with steady gait.  Awaiting disposition plan from Behavioral Medicine and/or Social Work team(s).   Paulette Blanch, MD 08/25/18 [phone]

## 2018-08-25 NOTE — ED Notes (Signed)
Pt observed with no unusual behavior  Appropriate to stimulation  No verbalized needs or concerns at this time  NAD assessed  Continue to monitor 

## 2018-08-25 NOTE — ED Provider Notes (Signed)
Patient seen and evaluated by psychiatry, Dr. Mallie Darting.  IVC rescinded, and deemed appropriate for discharge.  We will plan for discharge.   Lilia Pro., MD 08/25/18 [phone]

## 2018-08-25 NOTE — ED Notes (Signed)
Hourly rounding reveals patient sleeping in room. No complaints, stable, in no acute distress. Q15 minute rounds and monitoring via Rover and Officer to continue.  

## 2018-08-25 NOTE — ED Notes (Signed)
Hourly rounding reveals patient in room. No complaints, stable, in no acute distress. Q15 minute rounds and monitoring via Rover and Officer to continue.   

## 2018-08-25 NOTE — Discharge Instructions (Signed)
Please follow-up with your outpatient psychiatrist.  Please return to the emergency department for any new or worsening symptoms.

## 2018-08-25 NOTE — ED Notes (Signed)
IVC PAPERS  RESCINDED PER  DR  CLARY MD  INFORMED  AMY  T  RN 

## 2018-08-25 NOTE — ED Notes (Signed)
Pt given supplies to take a shower and bed linen changed.

## 2018-08-25 NOTE — ED Notes (Signed)

## 2018-08-25 NOTE — ED Notes (Signed)
ED  Is the patient under IVC or is there intent for IVC: Yes.   Is the patient medically cleared: Yes.   Is there vacancy in the ED BHU: Yes.   Is the population mix appropriate for patient: Yes.   Is the patient awaiting placement in inpatient or outpatient setting: Yes.   Has the patient had a psychiatric consult: Yes.   Survey of unit performed for contraband, proper placement and condition of furniture, tampering with fixtures in bathroom, shower, and each patient room: Yes.  ; Findings:  APPEARANCE/BEHAVIOR Calm and cooperative NEURO ASSESSMENT Orientation: oriented x3  Denies pain Hallucinations: No.None noted (Hallucinations)  Denies  Speech: Normal Gait: normal RESPIRATORY ASSESSMENT Even  Unlabored respirations  CARDIOVASCULAR ASSESSMENT Pulses equal   regular rate  Skin warm and dry   GASTROINTESTINAL ASSESSMENT no GI complaint EXTREMITIES Full ROM  PLAN OF CARE Provide calm/safe environment. Vital signs assessed twice daily. ED BHU Assessment once each 12-hour shift. Assure the ED provider has rounded once each shift. Provide and encourage hygiene. Provide redirection as needed. Assess for escalating behavior; address immediately and inform ED provider.  Assess family dynamic and appropriateness for visitation as needed: Yes.  ; If necessary, describe findings:  Educate the patient/family about BHU procedures/visitation: Yes.  ; If necessary, describe findings:
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
## 2018-08-25 NOTE — ED Notes (Signed)
Pt given breakfast tray

## 2018-08-25 NOTE — ED Notes (Signed)
BEHAVIORAL HEALTH ROUNDING Patient sleeping: No. Patient alert and oriented: yes Behavior appropriate: Yes.  ; If no, describe:  Nutrition and fluids offered: yes Toileting and hygiene offered: Yes  Sitter present: q15 minute observations and security  monitoring Law enforcement present: Yes  ODS  
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
## 2018-08-26 LAB — HEPATITIS PANEL, ACUTE
HCV Ab: <0.1 s/co ratio (ref 0.0–0.9)
Hep A IgM: NEGATIVE
Hep B C IgM: NEGATIVE
Hepatitis B Surface Ag: NEGATIVE

## 2018-08-26 LAB — RPR: RPR Ser Ql: NONREACTIVE

## 2018-08-26 LAB — HIV ANTIBODY (ROUTINE TESTING W REFLEX): HIV Screen 4th Generation wRfx: NONREACTIVE

## 2018-08-31 ENCOUNTER — Telehealth: Payer: Self-pay | Admitting: Emergency Medicine

## 2018-08-31 NOTE — Telephone Encounter (Signed)
Copied from [place] [id number]. Topic: General - Other >> Aug 30, 2018  1:55 PM Celene Kras A wrote: Reason for CRM: Rhoderick Moody, pts mother, called and would like pt to establish care. Pts mother states pt was recently diagnosed with Psychosis. Please advise.  Callback # [phone]

## 2018-08-31 NOTE — Telephone Encounter (Signed)
Please advise front desk if you are willing to take this patient

## 2018-08-31 NOTE — Telephone Encounter (Signed)
Yes - I am taking new patients and would be happy to see her for new patient visit.
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
## 2018-09-01 NOTE — Telephone Encounter (Signed)
Please schedule appointment.
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
## 2018-09-01 NOTE — Telephone Encounter (Signed)
Pt stated that she already has it taken care of

## 2019-04-07 ENCOUNTER — Ambulatory Visit: Payer: Self-pay | Attending: Internal Medicine

## 2019-04-07 DIAGNOSIS — Z20822 Contact with and (suspected) exposure to covid-19: Secondary | ICD-10-CM

## 2019-04-09 LAB — NOVEL CORONAVIRUS, NAA: SARS-CoV-2, NAA: NOT DETECTED

## 2020-10-04 ENCOUNTER — Encounter: Payer: Self-pay | Admitting: Internal Medicine

## 2020-10-04 ENCOUNTER — Ambulatory Visit (INDEPENDENT_AMBULATORY_CARE_PROVIDER_SITE_OTHER): Payer: 59 | Admitting: Internal Medicine

## 2020-10-04 ENCOUNTER — Other Ambulatory Visit: Payer: Self-pay

## 2020-10-04 VITALS — BP 127/85 | HR 102 | Temp 98.0°F | Ht 69.0 in | Wt 195.0 lb

## 2020-10-04 DIAGNOSIS — Z1211 Encounter for screening for malignant neoplasm of colon: Secondary | ICD-10-CM | POA: Insufficient documentation

## 2020-10-04 DIAGNOSIS — F17200 Nicotine dependence, unspecified, uncomplicated: Secondary | ICD-10-CM | POA: Diagnosis not present

## 2020-10-04 DIAGNOSIS — Z23 Encounter for immunization: Secondary | ICD-10-CM | POA: Insufficient documentation

## 2020-10-04 DIAGNOSIS — Z1231 Encounter for screening mammogram for malignant neoplasm of breast: Secondary | ICD-10-CM | POA: Insufficient documentation

## 2020-10-04 DIAGNOSIS — R059 Cough, unspecified: Secondary | ICD-10-CM | POA: Insufficient documentation

## 2020-10-04 DIAGNOSIS — Z1239 Encounter for other screening for malignant neoplasm of breast: Secondary | ICD-10-CM | POA: Diagnosis not present

## 2020-10-04 DIAGNOSIS — F17210 Nicotine dependence, cigarettes, uncomplicated: Secondary | ICD-10-CM | POA: Insufficient documentation

## 2020-10-04 DIAGNOSIS — F419 Anxiety disorder, unspecified: Secondary | ICD-10-CM | POA: Insufficient documentation

## 2020-10-04 MED ORDER — SPIRIVA HANDIHALER 18 MCG IN CAPS: 18.0000 ug | ORAL_CAPSULE | Freq: Every day | RESPIRATORY_TRACT | 3 refills | Status: DC

## 2020-10-04 MED ORDER — ALBUTEROL SULFATE HFA 108 (90 BASE) MCG/ACT IN AERS: 2.0000 | INHALATION_SPRAY | Freq: Four times a day (QID) | RESPIRATORY_TRACT | 3 refills | Status: DC | PRN

## 2020-10-04 MED ORDER — METHYLPREDNISOLONE SODIUM SUCC 40 MG IJ SOLR
120.0000 mg | Freq: Once | INTRAMUSCULAR | Status: AC
Start: 2020-10-04 — End: 2020-10-04
  Administered 2020-10-04: 120 mg via INTRAMUSCULAR

## 2020-10-04 MED ORDER — NICOTINE 21 MG/24HR TD PT24: 21.0000 mg | MEDICATED_PATCH | Freq: Every day | TRANSDERMAL | 0 refills | Status: DC

## 2020-10-04 MED ORDER — HYDROXYZINE PAMOATE 25 MG PO CAPS: 25.0000 mg | ORAL_CAPSULE | Freq: Three times a day (TID) | ORAL | 0 refills | Status: DC | PRN

## 2020-10-04 MED ORDER — IPRATROPIUM-ALBUTEROL 0.5-2.5 (3) MG/3ML IN SOLN
3.0000 mL | Freq: Once | RESPIRATORY_TRACT | Status: AC
  Administered 2020-10-04: 3 mL via RESPIRATORY_TRACT

## 2020-10-04 NOTE — Progress Notes (Signed)
BP 127/85   Pulse (!) 102   Temp 98 F (36.7 C) (Oral)   Ht 5\' 9"  (1.753 m)   Wt 195 lb (88.5 kg)   LMP  (LMP Unknown)   SpO2 91%   BMI 28.80 kg/m    Subjective:    Patient ID: , female    DOB: 1970-12-27, 50 y.o.   MRN: [id number]  Chief Complaint  Patient presents with   New Patient (Initial Visit)   Anxiety    Pt states she wants to discuss her anxiety. States he has GAD and has struggled with it her whole life.    HPI: Gwendolyn Hansen is a 50 y.o. female  Here to estbalish care, was seeing a pcp in 44 past [place] changed and moved to [place] . Was given steroids and augmentin x 29months ago. Sister and her have been suffering from allergeies .   Has a history of opioid dependence and is on Suboxone for such.  She is trying to wean herself off of this per her verbal record. She has a history of anxiety chronically and was on multiple meds which she failed says her last psychiatrist had to put her on Valium and Xanax in the past. Current smoker however had quit smoking and restarted sec to a lot of deaths in the family. Mom died of lung cancer and she says she knows better. Smoked about 22 yrs 11/2  ppd then slowed down. Very SOB and wheezing today along with a chronic cough.   Anxiety Presents for initial (feels sob, was on klonipin 3 times a day stopped taking this - saw a psych who put her on xanax) visit. Episode onset: treid buspar , zoloft failed such. Symptoms include shortness of breath. Patient reports no chest pain.    Shortness of Breath This is a chronic problem. The problem has been gradually worsening. Associated symptoms include sputum production and wheezing. Pertinent negatives include no abdominal pain, chest pain, claudication, coryza, ear pain, fever, headaches, hemoptysis, leg pain, leg swelling, neck pain, orthopnea, PND, rash, rhinorrhea, sore throat, swollen glands, syncope or vomiting. Associated symptoms comments: Clear  phelgm.  Cough This is a recurrent problem. The current episode started more than 1 year ago. Associated symptoms include shortness of breath and wheezing. Pertinent negatives include no chest pain, ear pain, fever, headaches, hemoptysis, rash, rhinorrhea or sore throat.   Chief Complaint  Patient presents with   New Patient (Initial Visit)   Anxiety    Pt states she wants to discuss her anxiety. States he has GAD and has struggled with it her whole life.    Relevant past medical, surgical, family and social history reviewed and updated as indicated. Interim medical history since our last visit reviewed. Allergies and medications reviewed and updated.  Review of Systems  Constitutional:  Negative for fever.  HENT:  Negative for ear pain, rhinorrhea and sore throat.   Respiratory:  Positive for cough, sputum production, shortness of breath and wheezing. Negative for hemoptysis.   Cardiovascular:  Negative for chest pain, orthopnea, claudication, leg swelling, syncope and PND.  Gastrointestinal:  Negative for abdominal pain and vomiting.  Musculoskeletal:  Negative for neck pain.  Skin:  Negative for rash.  Neurological:  Negative for headaches.   Per HPI unless specifically indicated above     Objective:    BP 127/85   Pulse (!) 102   Temp 98 F (36.7 C) (Oral)   Ht 5\' 9"  (1.753 m)  Wt 195 lb (88.5 kg)   LMP  (LMP Unknown)   SpO2 91%   BMI 28.80 kg/m   Wt Readings from Last 3 Encounters:  10/04/20 195 lb (88.5 kg)  01/15/15 180 lb (81.6 kg)  08/17/14 186 lb 3.2 oz (84.5 kg)    Physical Exam Vitals and nursing note reviewed.  Constitutional:      General: She is not in acute distress.    Appearance: Normal appearance. She is not ill-appearing or diaphoretic.  Eyes:     Conjunctiva/sclera: Conjunctivae normal.  Pulmonary:     Effort: No respiratory distress.     Breath sounds: No stridor. Wheezing present. No rhonchi or rales.  Chest:     Chest wall: No  tenderness.  Abdominal:     General: Abdomen is flat. Bowel sounds are normal. There is no distension.     Palpations: Abdomen is soft. There is no mass.     Tenderness: There is no abdominal tenderness. There is no guarding.  Skin:    General: Skin is warm and dry.     Coloration: Skin is not jaundiced.     Findings: No erythema.  Neurological:     Mental Status: She is alert.    Results for orders placed or performed in visit on 04/07/19  Novel Coronavirus, NAA ([facility])   Specimen: Nasopharyngeal(NP) swabs in vial transport medium   NASOPHARYNGE  TESTING  Result Value Ref Range   SARS-CoV-2, NAA Not Detected Not Detected        Current Outpatient Medications:    albuterol (VENTOLIN HFA) 108 (90 Base) MCG/ACT inhaler, Inhale 2 puffs into the lungs every 6 (six) hours as needed for wheezing or shortness of breath., Disp: 8 g, Rfl: 3   amitriptyline (ELAVIL) 100 MG tablet, Take 1 tablet (100 mg total) by mouth at bedtime., Disp: 30 tablet, Rfl: 0   buprenorphine (SUBUTEX) 8 MG SUBL SL tablet, Place 4 mg under the tongue daily., Disp: , Rfl: 0   hydrOXYzine (VISTARIL) 25 MG capsule, Take 1 capsule (25 mg total) by mouth every 8 (eight) hours as needed., Disp: 30 capsule, Rfl: 0   montelukast (SINGULAIR) 10 MG tablet, Take 10 mg by mouth daily., Disp: , Rfl:    tiotropium (SPIRIVA HANDIHALER) 18 MCG inhalation capsule, Place 1 capsule (18 mcg total) into inhaler and inhale daily., Disp: 30 capsule, Rfl: 3    Assessment & Plan:  Acute exacerbation of possibly COPD we will check PFTs undiagnosed up until now. Given patient has a 22-year history of smoking about a pack to pack and a half a day.  DuoNebs given x1.  Solu-Medrol 120 mg administered IM today. Will start patient on Spiriva inhaled once a day for presumed COPD as well as albuterol as rescue inhaler. Patient does states she has used this in the past as her mom had COPD and lung cancer. Will refer to pulmonology for  PFTs.  Anxiety chronic stable will refer to psychiatry for further work-up and management we will start patient on hydroxyzine every 8 hourly as needed for now. Will refer to social worker in the meanwhile  Smoking cessation Smoking cessation advised. Start nicotine patches qould like to quit. . more than > 5 - 10 mins of time was spent with pt regarding smoking cessation and complications.          Problem List Items Addressed This Visit       Other   Cough   Relevant Orders   Ambulatory  referral to Pulmonology   DG Chest 2 View   AMB Referral to [facility] Coordinaton   Anxiety   Relevant Medications   hydrOXYzine (VISTARIL) 25 MG capsule   Other Relevant Orders   Ambulatory referral to Psychiatry   AMB Referral to [facility] Coordinaton   Encounter for screening mammogram for malignant neoplasm of breast   Other Visit Diagnoses     Screening for colon cancer    -  Primary   Relevant Orders   Ambulatory referral to Gastroenterology   Need for influenza vaccination       Relevant Orders   Flu Vaccine QUAD 42mo+IM (Fluarix, Fluzone & Alfiuria Quad PF)        Orders Placed This Encounter  Procedures   MM 3D SCREEN BREAST BILATERAL   DG Chest 2 View   Flu Vaccine QUAD 66mo+IM (Fluarix, Fluzone & Alfiuria Quad PF)   Ambulatory referral to Gastroenterology   Ambulatory referral to Pulmonology   Ambulatory referral to Psychiatry   AMB Referral to [facility]     Meds ordered this encounter  Medications   tiotropium (SPIRIVA HANDIHALER) 18 MCG inhalation capsule    Sig: Place 1 capsule (18 mcg total) into inhaler and inhale daily.    Dispense:  30 capsule    Refill:  3   albuterol (VENTOLIN HFA) 108 (90 Base) MCG/ACT inhaler    Sig: Inhale 2 puffs into the lungs every 6 (six) hours as needed for wheezing or shortness of breath.    Dispense:  8 g    Refill:  3   hydrOXYzine (VISTARIL) 25 MG capsule    Sig: Take 1 capsule (25 mg total) by  mouth every 8 (eight) hours as needed.    Dispense:  30 capsule    Refill:  0     Follow up plan: Return in about 3 weeks (around 10/25/2020).  Health Maintenance : Mammogram/ Paps smear: Cscope : ordered Pneumonia vaccine :next visit FLu vaccine : today  Labs next visit : CBC, CMP, FLP,TSH

## 2020-10-06 ENCOUNTER — Other Ambulatory Visit: Payer: Self-pay

## 2020-10-06 DIAGNOSIS — Z136 Encounter for screening for cardiovascular disorders: Secondary | ICD-10-CM

## 2020-10-06 DIAGNOSIS — F419 Anxiety disorder, unspecified: Secondary | ICD-10-CM

## 2020-10-06 DIAGNOSIS — Z1329 Encounter for screening for other suspected endocrine disorder: Secondary | ICD-10-CM

## 2020-10-08 ENCOUNTER — Telehealth: Payer: Self-pay

## 2020-10-08 NOTE — Telephone Encounter (Signed)
Called patient no answer letter being sent 

## 2020-10-08 NOTE — Telephone Encounter (Signed)
Copied from CRM [id number]. Topic: General - Other >> Oct 05, 2020  3:57 PM Glean Salen wrote: Reason for CRM: patient called in about referal for [facility], says that [facility] wanted to give fax # to get more info onher condition for the referral. Please call back  [phone]

## 2020-10-09 ENCOUNTER — Telehealth: Payer: Self-pay

## 2020-10-09 NOTE — Chronic Care Management (AMB) (Signed)
  Care Management   Note  10/09/2020 Name: Gwendolyn Hansen MRN: [id number] DOB: 08/19/1970  [name] [name] is a 50 y.o. year old female who is a primary care patient of [facility]. I reached out to Biagio Quint by phone today in response to a referral sent by [name] health plan.    [name] was given information about care management services today including:  Care management services include personalized support from designated clinical staff supervised by her physician, including individualized plan of care and coordination with other care providers 24/7 contact phone numbers for assistance for urgent and routine care needs. The patient may stop care management services at any time by phone call to the office staff.  Patient agreed to services and verbal consent obtained.   Follow up plan: Telephone appointment with care management team member scheduled for:10/18/2020  Penne Lash, RMA Care Guide, Embedded Care Coordination [facility]  [place], [place] [address] Direct Dial: [phone] [email or web address]: [email or web address]

## 2020-10-09 NOTE — Telephone Encounter (Signed)
Pt returned the call regarding the referral.  Please return pt;s call [phone]

## 2020-10-11 ENCOUNTER — Telehealth: Payer: Self-pay

## 2020-10-11 NOTE — Telephone Encounter (Signed)
Copied from CRM [id number]. Topic: Appointment Scheduling - Scheduling Inquiry for Clinic >> Oct 10, 2020  4:55 PM Randol Kern wrote: Reason for CRM: Pt would like to establish with [facility], please advise   Best contact: [phone]

## 2020-10-11 NOTE — Telephone Encounter (Signed)
Left message advising Ms. Gurr that [facility] is not taking new pts.  (She is already established with [facility] and has an appointment there)   Thanks,   -Vernona Rieger

## 2020-10-13 ENCOUNTER — Other Ambulatory Visit: Payer: Self-pay | Admitting: Internal Medicine

## 2020-10-16 ENCOUNTER — Institutional Professional Consult (permissible substitution): Payer: Self-pay | Admitting: Internal Medicine

## 2020-10-18 ENCOUNTER — Ambulatory Visit: Payer: 59 | Admitting: Licensed Clinical Social Worker

## 2020-10-22 NOTE — Patient Instructions (Signed)
Visit Information   Goals Addressed             This Visit's Progress    Obtain Supportive Resources   On track    Timeframe:  Long-Range Goal Priority:  High Start Date:     10/18/20                        Expected End Date:  01/19/21                     Follow Up Date 11/22/20  Patient Goals/Self-Care Activities: Over the next 120 days Continue with compliance of taking medication  Attend scheduled appointments with providers Contact clinic with any questions/concerns Utilize healthy coping skills discussed Follow up with [facility] and/or [facility] for med management        Patient verbalizes understanding of instructions provided today and agrees to view in [place].   Telephone follow up appointment with care management team member scheduled for:11/22/20  Jenel Lucks, MSW, LCSW [facility] Care Management [facility]  [facility] [email or web address] Phone [phone] 1:52 PM

## 2020-10-22 NOTE — Chronic Care Management (AMB) (Signed)
Care Management Clinical Social Work Note  10/22/2020 Name: Gwendolyn Hansen MRN: [id number] DOB: [birth year]  Gwendolyn Hansen is a 50 y.o. year old female who is a primary care patient of [facility].  The Care Management team was consulted for assistance with chronic disease management and coordination needs.  Engaged with patient by telephone for initial visit in response to provider referral for social work chronic care management and care coordination services  Consent to Services:  Gwendolyn Hansen was given information about Care Management services today including:  Care Management services includes personalized support from designated clinical staff supervised by her physician, including individualized plan of care and coordination with other care providers 24/7 contact phone numbers for assistance for urgent and routine care needs. The patient may stop case management services at any time by phone call to the office staff.  Patient agreed to services and consent obtained.   Consent to Services:  The patient was given information about Care Management services, agreed to services, and gave verbal consent prior to initiation of services.  Please see initial visit note for detailed documentation.   Patient agreed to services today and consent obtained.   Assessment: Engaged with patient by phone in response to provider referral for social work care coordination services: Mental Health Counseling and Resources and Grief Counseling.    Patient is currently experiencing symptoms of  depression and anxiety which seems to be exacerbated by trauma and grief. Patient reports compliance with medication management. Patient's referral to [facility] was denied. CCM LCSW provided patient with supportive resources for medication management and grief. See Care Plan below for interventions and patient self-care activities.  Recent life changes or stressors: Grief,  management of anxiety symptoms  Recommendation: Patient may benefit from, and is in agreement work with LCSW to address care coordination needs and will continue to work with the clinical team to address health care and disease management related needs.   Follow up Plan: Patient would like continued follow-up from CCM LCSW .  per patient's request will follow up in 11/22/20.  Will call office if needed prior to next encounter.  SDOH (Social Determinants of Health) assessments and interventions performed:  NA  Advanced Directives Status: Not addressed in this encounter.  Care Plan  No Known Allergies  Outpatient Encounter Medications as of 10/18/2020  Medication Sig   albuterol (VENTOLIN HFA) 108 (90 Base) MCG/ACT inhaler Inhale 2 puffs into the lungs every 6 (six) hours as needed for wheezing or shortness of breath.   amitriptyline (ELAVIL) 100 MG tablet Take 1 tablet (100 mg total) by mouth at bedtime.   buprenorphine (SUBUTEX) 8 MG SUBL SL tablet Place 4 mg under the tongue daily.   hydrOXYzine (VISTARIL) 25 MG capsule TAKE 1 CAPSULE(25 MG) BY MOUTH EVERY 8 HOURS AS NEEDED   montelukast (SINGULAIR) 10 MG tablet Take 10 mg by mouth daily.   nicotine (NICODERM CQ - DOSED IN MG/24 HOURS) 21 mg/24hr patch Place 1 patch (21 mg total) onto the skin daily.   tiotropium (SPIRIVA HANDIHALER) 18 MCG inhalation capsule Place 1 capsule (18 mcg total) into inhaler and inhale daily.   No facility-administered encounter medications on file as of 10/18/2020.    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   Tobacco use disorder 08/18/2014   Neck pain 08/17/2014   Ankle pain 08/17/2014   GAD (generalized anxiety disorder) 08/17/2014   Depression 08/17/2014   Opioid use disorder, severe, in controlled environment, dependence (HCC) 08/17/2014   History of alcohol dependence (HCC) 08/17/2014    Conditions to be addressed/monitored: Anxiety and Depression; Mental Health Concerns   Care Plan : LCSW Care Plan  Updates made by Gwendolyn Larsson, LCSW since 10/22/2020 12:00 AM     Problem: Coping Skills (General Plan of Care)      Goal: Coping Skills Enhanced   Start Date: 10/18/2020  This Visit's Progress: On track  Priority: Medium  Note:   Current barriers:   Severe Persistent Mental Health needs related to Depression and Anxiety Mental Health Concerns  and Grief Needs Support, Education, and Care Coordination in order to meet unmet mental health needs. Clinical Goal(s): demonstrate a reduction in symptoms related to :Anxiety , Depression, Grief, and increase self-management skills     Clinical Interventions:  Assessed patient's previous and current treatment, coping skills, support system and barriers to care  Patient reports difficulty managing sinus infection for a couple of days. She has been taking OTC meds; however, reports mucus is turning yellow and green. Patient would like to schedule an appt with PCP to discuss antibiotics. CCM LCSW sent message to Gwendolyn Hansen admin for scheduling assistance. She has an upcoming appt with PCP for 10/29/20 Patient reports compliance with medication stating inhalers "worked a lot" Patient reports difficulty managing social anxiety "all my life" She has hx of medication management and therapy, sharing that speaking on trauma was ineffective for managing symptoms. Patient is currently grieving the loss of mother, noting "she was my everything" Denies SI/HI CCM LCSW provided validation and support.  Psychoeducation regarding stages of grief was discussed and supportive resources were provided Patient has a strong support system consisting of fiance' and sister Patient was successful in identifying coping skills Patient reports that hydroxyzine has not assisted in managing episodes of high anxiety. Patient was contacted by [facility] and informed that they did not feel she was a strong candidate for services. Encouraged her to initiate services through [facility]. Patient shared that she received services from [facility] in the past; however, her social anxiety symptoms are too severe to complete walk-in intake and/or attend in-person services. Patient reports that she stays in the house as much as she can. CCM LCSW provided patient with additional local agencies that provide medication management, including [facility] Depression screen reviewed  Solution-Focused Strategies Active listening / Reflection utilized  Emotional Supportive Provided Provided psychoeducation for mental health needs  Quality of sleep assessed & Sleep Hygiene techniques promoted  Participation in counseling encouraged  Verbalization of feelings encouraged  Crisis Resource Education / information provided  Suicidal Ideation/Homicidal Ideation assessed: ; Review various resources, discussed options and provided patient information about  Options for mental health treatment based on need and insurance 1:1 collaboration with primary care provider regarding development and update of comprehensive plan of care as evidenced by provider attestation and co-signature Inter-disciplinary care team collaboration (see longitudinal plan of care) Patient Goals/Self-Care Activities: Over the next 120 days Continue with compliance of taking medication  Attend scheduled appointments with providers Contact clinic with any questions/concerns Utilize healthy coping skills  discussed Follow up with [facility] and/or [facility] for med management        Jenel Lucks, MSW, LCSW [facility] Care Management [place]  [facility] [email or web address] Phone [phone] 1:49 PM

## 2020-10-23 ENCOUNTER — Encounter: Payer: Self-pay | Admitting: Internal Medicine

## 2020-10-23 ENCOUNTER — Telehealth (INDEPENDENT_AMBULATORY_CARE_PROVIDER_SITE_OTHER): Payer: 59 | Admitting: Internal Medicine

## 2020-10-23 VITALS — Temp 98.8°F | Ht 69.0 in | Wt 195.0 lb

## 2020-10-23 DIAGNOSIS — J329 Chronic sinusitis, unspecified: Secondary | ICD-10-CM | POA: Diagnosis not present

## 2020-10-23 MED ORDER — AMOXICILLIN-POT CLAVULANATE 500-125 MG PO TABS: 1.0000 | ORAL_TABLET | Freq: Two times a day (BID) | ORAL | 0 refills | Status: AC

## 2020-10-23 NOTE — Progress Notes (Addendum)
Temp 98.8 F (37.1 C) (Oral)   Ht 5\' 9"  (1.753 m)   Wt 195 lb (88.5 kg)   LMP  (LMP Unknown)   BMI 28.80 kg/m    Subjective:    Patient ID: , female    DOB: [birth year], 50 y.o.   MRN: [id number]  Chief Complaint  Patient presents with   Sinusitis    Started 3 weeks ago. Taking OTC sinus meds, and Singulair but only mild relief. Coughing- green mucous. Sinus pressure and pain. Both ears are ringing, and sore throat. At home Covid test was negative- 3 days ago. Low grade fever to start but now no fever. No SOB.    HPI: Gwendolyn Hansen is a 50 y.o. female  Bp was low 113/ 67 mm hg today and Temp will check and call 44 back.    Sinusitis This is a new problem. The current episode started 1 to 4 weeks ago. The problem has been gradually worsening since onset. There has been no fever. Her pain is at a severity of 4/10. She is experiencing no pain. Associated symptoms include chills, congestion, coughing, headaches and sinus pressure. Pertinent negatives include no diaphoresis, ear pain, hoarse voice, neck pain, shortness of breath, sneezing, sore throat or swollen glands.   Chief Complaint  Patient presents with   Sinusitis    Started 3 weeks ago. Taking OTC sinus meds, and Singulair but only mild relief. Coughing- green mucous. Sinus pressure and pain. Both ears are ringing, and sore throat. At home Covid test was negative- 3 days ago. Low grade fever to start but now no fever. No SOB.    Relevant past medical, surgical, family and social history reviewed and updated as indicated. Interim medical history since our last visit reviewed. Allergies and medications reviewed and updated.  Review of Systems  Constitutional:  Positive for chills. Negative for diaphoresis.  HENT:  Positive for congestion and sinus pressure. Negative for ear pain, hoarse voice, sneezing and sore throat.   Respiratory:  Positive for cough. Negative for shortness of breath.    Musculoskeletal:  Negative for neck pain.  Neurological:  Positive for headaches.   Per HPI unless specifically indicated above     Objective:    Temp 98.8 F (37.1 C) (Oral)   Ht 5\' 9"  (1.753 m)   Wt 195 lb (88.5 kg)   LMP  (LMP Unknown)   BMI 28.80 kg/m   Wt Readings from Last 3 Encounters:  10/23/20 195 lb (88.5 kg)  10/04/20 195 lb (88.5 kg)  01/15/15 180 lb (81.6 kg)    Physical Exam Pt appears ill on the video call.  Unable to peform sec to virtual visit.   Results for orders placed or performed in visit on 04/07/19  Novel Coronavirus, NAA ([facility])   Specimen: Nasopharyngeal(NP) swabs in vial transport medium   NASOPHARYNGE  TESTING  Result Value Ref Range   SARS-CoV-2, NAA Not Detected Not Detected        Current Outpatient Medications:    albuterol (VENTOLIN HFA) 108 (90 Base) MCG/ACT inhaler, Inhale 2 puffs into the lungs every 6 (six) hours as needed for wheezing or shortness of breath., Disp: 8 g, Rfl: 3   amitriptyline (ELAVIL) 100 MG tablet, Take 1 tablet (100 mg total) by mouth at bedtime., Disp: 30 tablet, Rfl: 0   amoxicillin-clavulanate (AUGMENTIN) 500-125 MG tablet, Take 1 tablet (500 mg total) by mouth 2 (two) times daily for 7 days., Disp: 14 tablet, Rfl:  0   buprenorphine (SUBUTEX) 8 MG SUBL SL tablet, Place 4 mg under the tongue daily., Disp: , Rfl: 0   hydrOXYzine (VISTARIL) 25 MG capsule, TAKE 1 CAPSULE(25 MG) BY MOUTH EVERY 8 HOURS AS NEEDED, Disp: 30 capsule, Rfl: 0   montelukast (SINGULAIR) 10 MG tablet, Take 10 mg by mouth daily., Disp: , Rfl:    nicotine (NICODERM CQ - DOSED IN MG/24 HOURS) 21 mg/24hr patch, Place 1 patch (21 mg total) onto the skin daily., Disp: 30 patch, Rfl: 0   tiotropium (SPIRIVA HANDIHALER) 18 MCG inhalation capsule, Place 1 capsule (18 mcg total) into inhaler and inhale daily., Disp: 30 capsule, Rfl: 3    Assessment & Plan:  Ac sinusitis : start Augmentin  COVID : negative  Increase fluid intake.  Headahce -  tyelnol every 4-6 hrs prn and alternate this with ibubrufen 800 mg q 8 hrly. Sinus pressure: use steam inhalation.  OTC -  Allegra / claritin.  5 days quarantine.  Ok to rtw in 5 days if tests -ve follow  Pt verbalized understanding of such, to get to the office at today and get a curb side test for the above.  Problem List Items Addressed This Visit       Respiratory   Sinusitis - Primary   Relevant Medications   amoxicillin-clavulanate (AUGMENTIN) 500-125 MG tablet     No orders of the defined types were placed in this encounter.    Meds ordered this encounter  Medications   amoxicillin-clavulanate (AUGMENTIN) 500-125 MG tablet    Sig: Take 1 tablet (500 mg total) by mouth 2 (two) times daily for 7 days.    Dispense:  14 tablet    Refill:  0     Follow up plan: No follow-ups on file.  This visit was completed via video visit through MyChart due to the restrictions of the COVID-19 pandemic. All issues as above were discussed and addressed. Physical exam was done as above through visual confirmation on video through MyChart. If it was felt that the patient should be evaluated in the office, they were directed there. The patient verbally consented to this visit. Location of the patient: home Location of the provider: work Those involved with this call:  Provider: Loura Pardon, MD CMA: Tristan Schroeder, CMA Front Desk/Registration: Kandice Hams  Time spent on call:  15 minutes with patient face to face via video conference. More than 50% of this time was spent in counseling and coordination of care. 10 minutes total spent in review of patient's record and preparation of their chart.

## 2020-10-29 ENCOUNTER — Encounter: Payer: Medicaid Other | Admitting: Internal Medicine

## 2020-11-08 ENCOUNTER — Encounter: Payer: 59 | Admitting: Internal Medicine

## 2020-11-12 ENCOUNTER — Encounter: Payer: Self-pay | Admitting: Nurse Practitioner

## 2020-11-12 ENCOUNTER — Other Ambulatory Visit: Payer: Self-pay

## 2020-11-12 ENCOUNTER — Ambulatory Visit (INDEPENDENT_AMBULATORY_CARE_PROVIDER_SITE_OTHER): Payer: 59 | Admitting: Nurse Practitioner

## 2020-11-12 VITALS — BP 113/81 | HR 88 | Temp 98.6°F | Resp 18 | Ht 70.0 in | Wt 197.0 lb

## 2020-11-12 DIAGNOSIS — J329 Chronic sinusitis, unspecified: Secondary | ICD-10-CM

## 2020-11-12 DIAGNOSIS — N3000 Acute cystitis without hematuria: Secondary | ICD-10-CM

## 2020-11-12 DIAGNOSIS — R300 Dysuria: Secondary | ICD-10-CM | POA: Diagnosis not present

## 2020-11-12 DIAGNOSIS — N951 Menopausal and female climacteric states: Secondary | ICD-10-CM

## 2020-11-12 LAB — URINALYSIS, ROUTINE W REFLEX MICROSCOPIC
Bilirubin, UA: NEGATIVE
Glucose, UA: NEGATIVE
Leukocytes,UA: NEGATIVE
Nitrite, UA: NEGATIVE
RBC, UA: NEGATIVE
Specific Gravity, UA: 1.02 (ref 1.005–1.030)
Urobilinogen, Ur: 1 mg/dL (ref 0.2–1.0)
pH, UA: 6.5 (ref 5.0–7.5)

## 2020-11-12 MED ORDER — MECLIZINE HCL 25 MG PO TABS: 25.0000 mg | ORAL_TABLET | Freq: Three times a day (TID) | ORAL | 0 refills | Status: DC | PRN

## 2020-11-12 MED ORDER — NITROFURANTOIN MONOHYD MACRO 100 MG PO CAPS: 100.0000 mg | ORAL_CAPSULE | Freq: Two times a day (BID) | ORAL | 0 refills | Status: DC

## 2020-11-12 MED ORDER — PREDNISONE 10 MG PO TABS: 10.0000 mg | ORAL_TABLET | Freq: Every day | ORAL | 0 refills | Status: DC

## 2020-11-12 MED ORDER — AMITRIPTYLINE HCL 150 MG PO TABS: 150.0000 mg | ORAL_TABLET | Freq: Every day | ORAL | 0 refills | Status: DC

## 2020-11-12 NOTE — Assessment & Plan Note (Signed)
Ongoing.  Will treat with Meclizine and prednisone.  Discussed side effects and benefits of medication with patient during visit.  Discussed proper use of medication.  Recommend using Zyrtec or Allegra daily.  Follow up if symptoms do not improve.

## 2020-11-12 NOTE — Progress Notes (Signed)
BP 113/81 (BP Location: Left Arm, Patient Position: Sitting)   Pulse 88   Temp 98.6 F (37 C) (Oral)   Resp 18   Ht 5\' 10"  (1.778 m)   Wt 197 lb (89.4 kg)   SpO2 98%   BMI 28.27 kg/m    Subjective:    Patient ID: , female    DOB: 1970/12/09, 50 y.o.   MRN: [id number]  HPI: Gwendolyn Hansen is a 50 y.o. female  Chief Complaint  Patient presents with   Nausea    6-7 days   Back Pain    R side   Urinary Retention   Patient presents to clinic with complaints of nausea, dizziness, back pain, urinary retention, hot flashes, and difficulty sleeping.  Patient states she has a lot of congestion and some ringing in her ears.  Patient feels like she has a UTI.   Denies HA, CP, SOB, palpitations, visual changes, and lower extremity swelling.   Relevant past medical, surgical, family and social history reviewed and updated as indicated. Interim medical history since our last visit reviewed. Allergies and medications reviewed and updated.  Review of Systems  HENT:  Positive for congestion.   Eyes:  Negative for visual disturbance.  Respiratory:  Negative for cough, chest tightness and shortness of breath.   Cardiovascular:  Negative for chest pain, palpitations and leg swelling.  Genitourinary:  Positive for dysuria.  Musculoskeletal:  Positive for back pain.  Neurological:  Positive for dizziness. Negative for headaches.   Per HPI unless specifically indicated above     Objective:    BP 113/81 (BP Location: Left Arm, Patient Position: Sitting)   Pulse 88   Temp 98.6 F (37 C) (Oral)   Resp 18   Ht 5\' 10"  (1.778 m)   Wt 197 lb (89.4 kg)   SpO2 98%   BMI 28.27 kg/m   Wt Readings from Last 3 Encounters:  11/12/20 197 lb (89.4 kg)  10/23/20 195 lb (88.5 kg)  10/04/20 195 lb (88.5 kg)    Physical Exam Vitals and nursing note reviewed.  Constitutional:      General: She is not in acute distress.    Appearance: Normal appearance. She is normal  weight. She is not ill-appearing, toxic-appearing or diaphoretic.  HENT:     Head: Normocephalic.     Right Ear: External ear normal. No tenderness. A middle ear effusion is present. Tympanic membrane is not erythematous.     Left Ear: External ear normal. No tenderness. A middle ear effusion is present. Tympanic membrane is not erythematous.     Nose: Congestion present.     Mouth/Throat:     Mouth: Mucous membranes are moist.     Pharynx: Oropharynx is clear. Posterior oropharyngeal erythema present.  Eyes:     General:        Right eye: No discharge.        Left eye: No discharge.     Extraocular Movements: Extraocular movements intact.     Conjunctiva/sclera: Conjunctivae normal.     Pupils: Pupils are equal, round, and reactive to light.  Cardiovascular:     Rate and Rhythm: Normal rate and regular rhythm.     Heart sounds: No murmur heard. Pulmonary:     Effort: Pulmonary effort is normal. No respiratory distress.     Breath sounds: Wheezing present. No rales.  Abdominal:     General: Abdomen is flat. Bowel sounds are normal. There is no distension.  Palpations: Abdomen is soft.     Tenderness: There is no abdominal tenderness. There is no right CVA tenderness, left CVA tenderness or guarding.  Musculoskeletal:     Cervical back: Normal range of motion and neck supple.  Skin:    General: Skin is warm and dry.     Capillary Refill: Capillary refill takes less than 2 seconds.  Neurological:     General: No focal deficit present.     Mental Status: She is alert and oriented to person, place, and time. Mental status is at baseline.  Psychiatric:        Mood and Affect: Mood normal.        Behavior: Behavior normal.        Thought Content: Thought content normal.        Judgment: Judgment normal.    Results for orders placed or performed in visit on 04/07/19  Novel Coronavirus, NAA ([facility])   Specimen: Nasopharyngeal(NP) swabs in vial transport medium   NASOPHARYNGE   TESTING  Result Value Ref Range   SARS-CoV-2, NAA Not Detected Not Detected      Assessment & Plan:   Problem List Items Addressed This Visit       Respiratory   Sinusitis    Ongoing.  Will treat with Meclizine and prednisone.  Discussed side effects and benefits of medication with patient during visit.  Discussed proper use of medication.  Recommend using Zyrtec or Allegra daily.  Follow up if symptoms do not improve.       Relevant Medications   predniSONE (DELTASONE) 10 MG tablet   nitrofurantoin, macrocrystal-monohydrate, (MACROBID) 100 MG capsule   Other Visit Diagnoses     Acute cystitis without hematuria    -  Primary   UA negative for bacteria. Will treat patient due to symptoms. Macrobid sent for 5 days. FU if symptoms return or do not improve. Urine sent for culture.   Relevant Orders   Urine Culture   Dysuria       Relevant Orders   Urinalysis, Routine w reflex microscopic   Hot flashes due to menopause       Will increase Elavil to 105mg  nightly to see if sleep and hot flashes improve.  Follow up with PCP if symptoms do not improve.         Follow up plan: Return if symptoms worsen or fail to improve.   A total of 30 minutes were spent on this encounter today.  When total time is documented, this includes both the face-to-face and non-face-to-face time personally spent before, during and after the visit on the date of the encounter discussing medications to treat symptoms.

## 2020-11-12 NOTE — Progress Notes (Signed)
Results discussed with patient during visit. Sent for culture.

## 2020-11-13 ENCOUNTER — Ambulatory Visit
Admission: RE | Admit: 2020-11-13 | Discharge: 2020-11-13 | Disposition: A | Discharge status: Home / Self Care | Payer: 59 | Source: Ambulatory Visit | Attending: Internal Medicine | Admitting: Internal Medicine

## 2020-11-13 ENCOUNTER — Encounter: Payer: Self-pay | Admitting: Internal Medicine

## 2020-11-13 ENCOUNTER — Ambulatory Visit: Payer: 59 | Admitting: Internal Medicine

## 2020-11-13 ENCOUNTER — Other Ambulatory Visit: Payer: Self-pay | Admitting: Internal Medicine

## 2020-11-13 ENCOUNTER — Ambulatory Visit
Admission: RE | Admit: 2020-11-13 | Discharge: 2020-11-13 | Disposition: A | Discharge status: Home / Self Care | Payer: 59 | Attending: Internal Medicine | Admitting: Internal Medicine

## 2020-11-13 VITALS — BP 110/80 | HR 99 | Temp 98.2°F | Ht 71.0 in | Wt 196.0 lb

## 2020-11-13 DIAGNOSIS — R0981 Nasal congestion: Secondary | ICD-10-CM | POA: Diagnosis not present

## 2020-11-13 DIAGNOSIS — J449 Chronic obstructive pulmonary disease, unspecified: Secondary | ICD-10-CM

## 2020-11-13 DIAGNOSIS — F17210 Nicotine dependence, cigarettes, uncomplicated: Secondary | ICD-10-CM

## 2020-11-13 IMAGING — CR DG CHEST 2V
1 series · 2 of 2 positions shown · non-contrast
Comparison: Chest x-ray [DATE].  On a

CLINICAL DATA: COPD

EXAM:
CHEST - 2 VIEW

[Series 1: dg chest 2 view · 0.14mm/px · 2 of 2 slices shown]
[im 1/2]
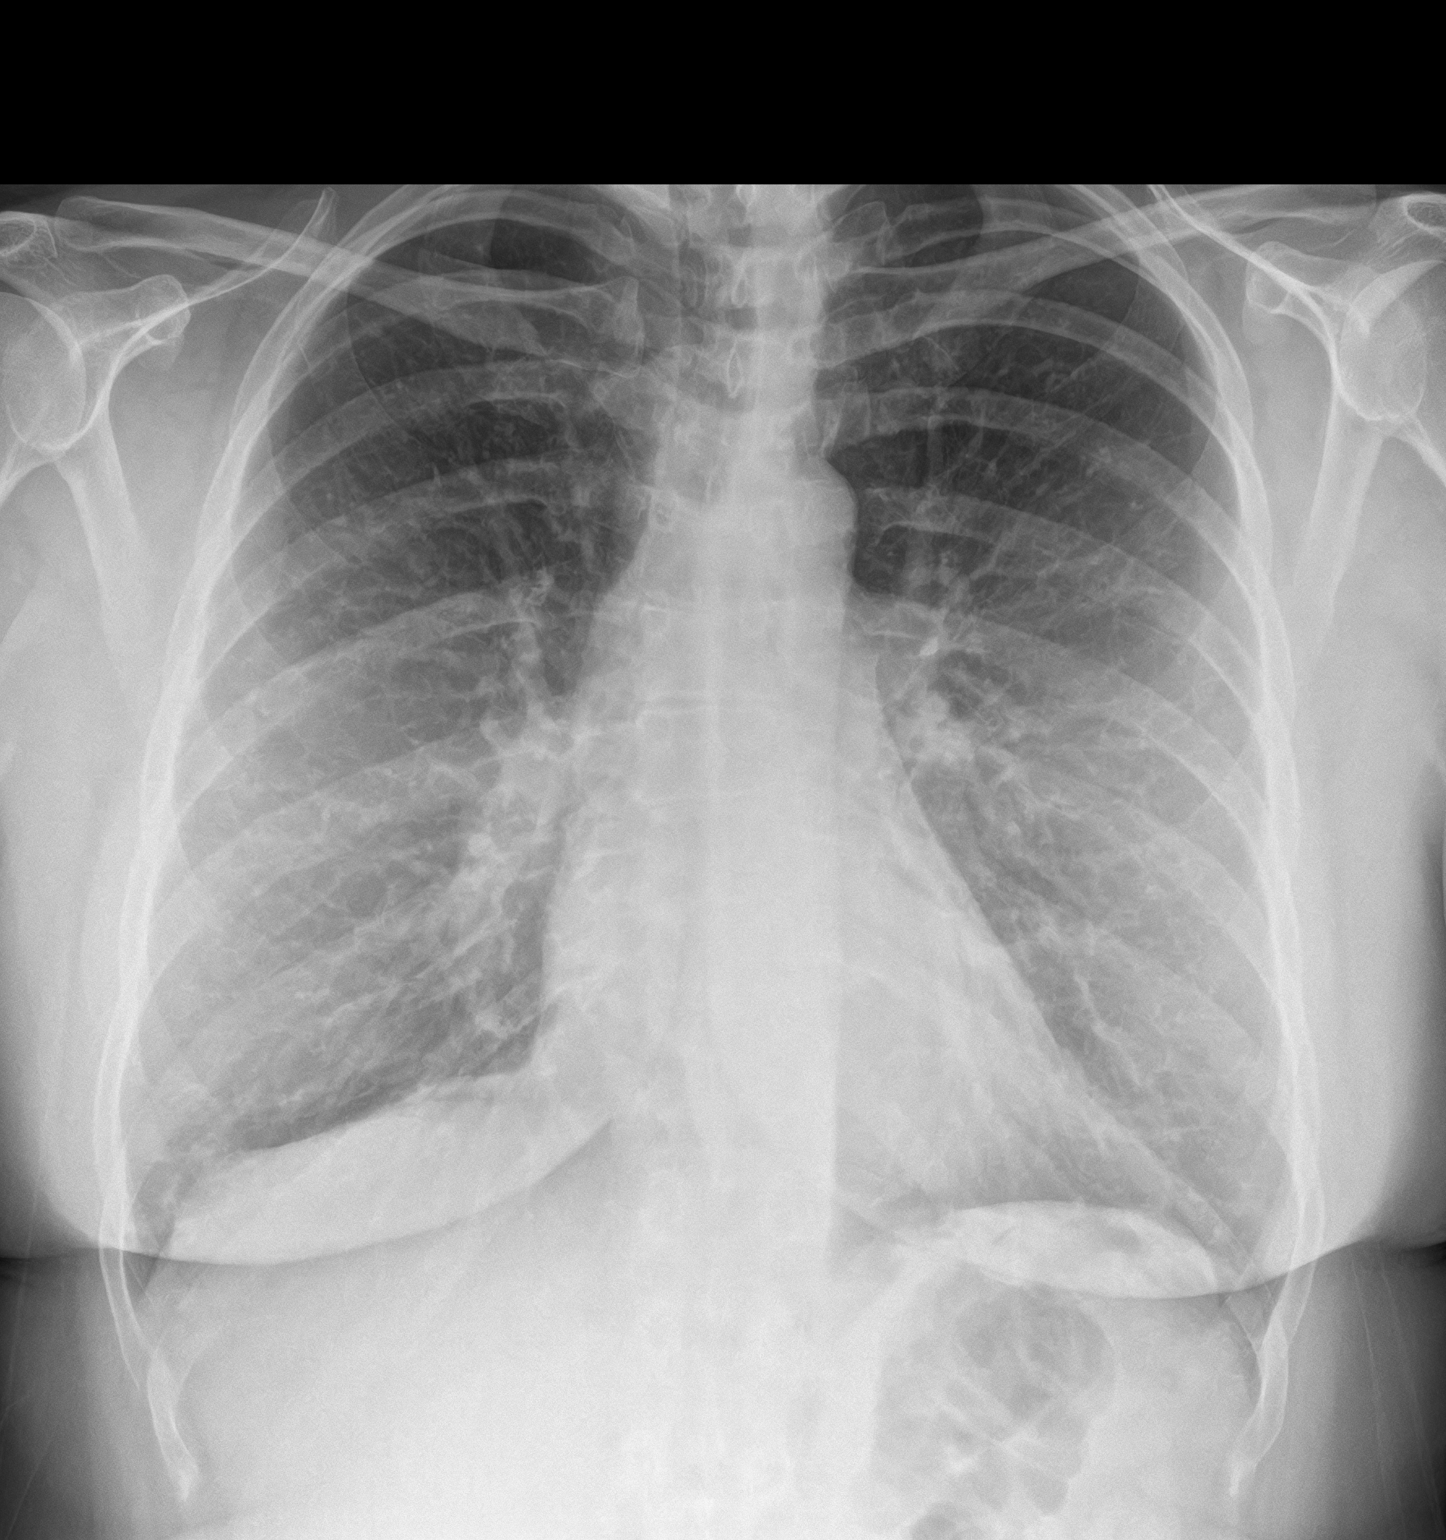
[im 2/2]
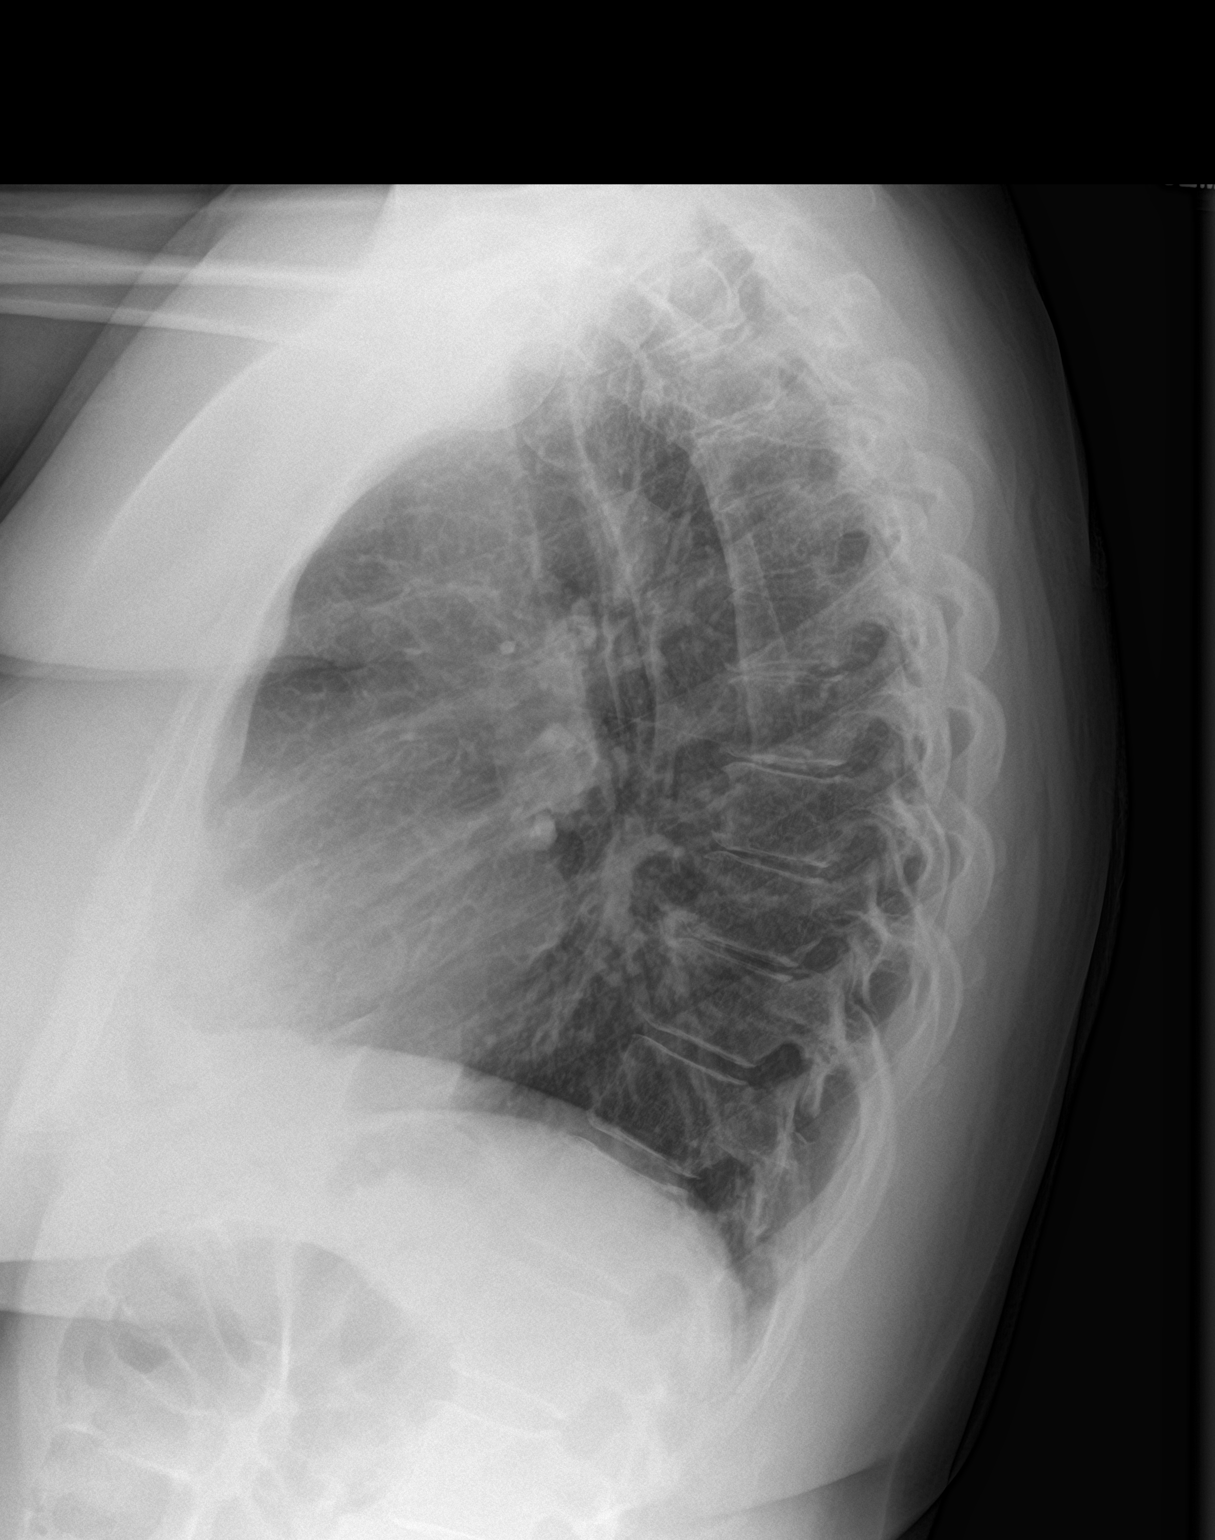

[2 of 2 positions shown; findings below may reference images not displayed]

FINDINGS: The heart size and mediastinal contours are within normal limits. No
focal consolidation identified. Mildly prominent interstitial lung
markings bilaterally. The visualized skeletal structures are
unremarkable.
IMPRESSION: No focal consolidation. Prominent interstitial lung markings which
may be chronic.

## 2020-11-13 MED ORDER — STIOLTO RESPIMAT 2.5-2.5 MCG/ACT IN AERS: 2.5000 ug | INHALATION_SPRAY | Freq: Two times a day (BID) | RESPIRATORY_TRACT | 0 refills | Status: DC

## 2020-11-13 MED ORDER — STIOLTO RESPIMAT 2.5-2.5 MCG/ACT IN AERS: 2.0000 | INHALATION_SPRAY | Freq: Every day | RESPIRATORY_TRACT | 11 refills | Status: DC

## 2020-11-13 MED ORDER — ALBUTEROL SULFATE (2.5 MG/3ML) 0.083% IN NEBU: 2.5000 mg | INHALATION_SOLUTION | RESPIRATORY_TRACT | 12 refills | Status: DC | PRN

## 2020-11-13 NOTE — Progress Notes (Signed)
Gwendolyn Hansen, female    DOB: Aug 05, 1970,   MRN: [id number]   Brief patient profile:  18 yowf active smoker with h/o sinus infections as child /grew up in house with smokers and sinus problems never improved then  in her 30s with freq "bronchitis" never had ent eval rx prn saba then started on spiriva but  not taking it regularly and referred to pulmonary clinic in [facility]  11/13/2020 by Dr Charlotta Newton   for cough/doe          History of Present Illness  11/13/2020  Pulmonary/ 1st office eval/ Kip Cropp / [employer]  on prednisone starting am of ov - was on augmentin 8/4 did not help Chief Complaint  Patient presents with   Consult    Congestion, smoker, wheezing, sob.  Dyspnea:  can still do shopping  Cough: yellow  Sleep: bed is flat/ 2 pillows  SABA MWN:UUVO in am hfa/ has neb not using   No obvious day to day or daytime variability or assoc  mucus plugs or hemoptysis or cp or chest tightness, subjective wheeze or overt sinus or hb symptoms.   Also denies any obvious fluctuation of symptoms with weather or environmental changes or other aggravating or alleviating factors except as outlined above   No unusual exposure hx or h/o childhood pna/ asthma or knowledge of premature birth.  Current Allergies, Complete Past Medical History, Past Surgical History, Family History, and Social History were reviewed in [employer] record.  ROS  The following are not active complaints unless bolded Hoarseness, sore throat, dysphagia, dental problems, itching, sneezing,  nasal congestion or discharge of excess mucus or purulent secretions, ear ache,   fever, chills, sweats, unintended wt loss or wt gain, classically pleuritic or exertional cp,  orthopnea pnd or arm/hand swelling  or leg swelling, presyncope, palpitations, abdominal pain, anorexia, nausea, vomiting, diarrhea  or change in bowel habits or change in bladder habits, change in stools or change in urine,  dysuria, hematuria,  rash, arthralgias, visual complaints, headache, numbness, weakness or ataxia or problems with walking or coordination,  change in mood or  memory.           Past Medical History:  Diagnosis Date   Allergy    Anxiety    Asthma     Outpatient Medications Prior to Visit  Medication Sig Dispense Refill   albuterol (VENTOLIN HFA) 108 (90 Base) MCG/ACT inhaler Inhale 2 puffs into the lungs every 6 (six) hours as needed for wheezing or shortness of breath. 8 g 3   amitriptyline (ELAVIL) 150 MG tablet Take 1 tablet (150 mg total) by mouth at bedtime. 30 tablet 0   buprenorphine (SUBUTEX) 8 MG SUBL SL tablet Place 4 mg under the tongue daily.  0   diazepam (VALIUM) 10 MG tablet Take 10 mg by mouth 2 (two) times daily.     hydrOXYzine (VISTARIL) 25 MG capsule TAKE 1 CAPSULE(25 MG) BY MOUTH EVERY 8 HOURS AS NEEDED 30 capsule 0   meclizine (ANTIVERT) 25 MG tablet Take 1 tablet (25 mg total) by mouth 3 (three) times daily as needed for dizziness. 30 tablet 0   montelukast (SINGULAIR) 10 MG tablet Take 10 mg by mouth daily.     nicotine (NICODERM CQ - DOSED IN MG/24 HOURS) 21 mg/24hr patch Place 1 patch (21 mg total) onto the skin daily. 30 patch 0   nitrofurantoin, macrocrystal-monohydrate, (MACROBID) 100 MG capsule Take 1 capsule (100 mg total) by mouth  2 (two) times daily. 10 capsule 0   predniSONE (DELTASONE) 10 MG tablet Take 1 tablet (10 mg total) by mouth daily with breakfast. Take 6 today, 5 tomorrow and decrease by 1 each day until the course is complete. 21 tablet 0   tiotropium (SPIRIVA HANDIHALER) 18 MCG inhalation capsule Place 1 capsule (18 mcg total) into inhaler and inhale daily. 30 capsule 3   No facility-administered medications prior to visit.     Objective:     BP 110/80 (BP Location: Left Arm, Patient Position: Sitting, Cuff Size: Normal)   Pulse 99   Temp 98.2 F (36.8 C) (Oral)   Ht 5\' 11"  (1.803 m)   Wt 196 lb (88.9 kg)   SpO2 95%   BMI 27.34  kg/m   SpO2: 95 %  Amb wf / difficult historian/ congested  sounding cough    HEENT : pt wearing mask not removed for exam due to covid -19 concerns.    NECK :  without JVD/Nodes/TM/ nl carotid upstrokes bilaterally   LUNGS: no acc muscle use,  Nl contour chest coarse Insp/exp rhonchi with upper airway pattern  CV:  RRR  no s3 or murmur or increase in P2, and no edema   ABD:  soft and nontender with nl inspiratory excursion in the supine position. No bruits or organomegaly appreciated, bowel sounds nl  MS:  Nl gait/ ext warm without deformities, calf tenderness, cyanosis or clubbing No obvious joint restrictions   SKIN: warm and dry without lesions    NEURO:  alert, approp, nl sensorium with  no motor or cerebellar deficits apparent.    CXR PA and Lateral:   11/13/2020 :    I personally reviewed images  / impression as follows:    Mild dorsal kyphosis, nl lung vol       Assessment   COPD GOLD ?/ active smoker  Active smoker  - 11/13/2020  After extensive coaching inhaler device,  effectiveness =    80% with smi > try stiolto / finish pred rx then pfts   DDX of  difficult airways management almost all start with A and  include Adherence, Ace Inhibitors, Acid Reflux, Active Sinus Disease, Alpha 1 Antitripsin deficiency, Anxiety masquerading as Airways dz,  ABPA,  Allergy(esp in young), Aspiration (esp in elderly), Adverse effects of meds,  Active smoking or vaping, A bunch of PE's (a small clot burden can't cause this syndrome unless there is already severe underlying pulm or vascular dz with poor reserve) plus two Bs  = Bronchiectasis and Beta blocker use..and one C= CHF  Adherence is always the initial "prime suspect" and is a multilayered concern that requires a "trust but verify" approach in every patient - starting with knowing how to use medications, especially inhalers, correctly, keeping up with refills and understanding the fundamental difference between maintenance  and prns vs those medications only taken for a very short course and then stopped and not refilled.  - see smi teaching above - return with all meds in hand using a trust but verify approach to confirm accurate Medication  Reconciliation The principal here is that until we are certain that the  patients are doing what we've asked, it makes no sense to ask them to do more.   Active smoking top of the rest of list of usual  Suspects, see sep a/p  ? Acid (or non-acid) GERD > always difficult to exclude as up to 75% of pts in some series report no assoc GI/  Heartburn symptoms> rec max (24h)  acid suppression and diet restrictions/ reviewed and instructions given in writing.   ? Active sinus dz > CT sinus next rather than continue empirical abx which have not helped  ? Allergy > finish pred rx and hold further eval until return   ? Anxiety/depression > usually at the bottom of this list of usual suspects but should be much higher on this pt's based on H and P and note already on psychotropics and may interfere with adherence and also interpretation of response or lack thereof to symptom management which can be quite subjective.   ? Adverse drug effects > note on macrodantin though she's confused as to why she's taking it > advised ok for short course only in the setting of active pulmonary symptoms as this is the main side effects of macrobid = active pulmonary symptoms, which she reports were the same prior to starting short course of therapy.        Each maintenance medication was reviewed in detail including emphasizing most importantly the difference between maintenance and prns and under what circumstances the prns are to be triggered using an action plan format where appropriate.  Total time for H and P, chart review, counseling,  and generating customized AVS unique to this office visit / same day charting =  35 min        Cigarette smoker 4-5 min discussion re active cigarette smoking in  addition to office E&M  Ask about tobacco use:   ongoing Advise quitting   I took an extended  opportunity with this patient to outline the consequences of continued cigarette use  in airway disorders based on all the data we have from the multiple national lung health studies (perfomed over decades at millions of dollars in cost)  indicating that smoking cessation, not choice of inhalers or physicians, is the most important aspect of her care.   Assess willingness:  Not fully committed at this point Assist in quit attempt:  Per PCP when ready Arrange follow up:   Follow up per Primary Care planned            Sandrea Hughs, MD 11/13/2020

## 2020-11-13 NOTE — Patient Instructions (Addendum)
Plan A = Automatic = Always=  Stiolto 2 puff each am  Work on inhaler technique:  relax and gently blow all the way out then take a nice smooth full deep breath back in, triggering the inhaler at same time you start breathing in.  Hold for up to 5 seconds if you can.   Rinse and gargle with water when done.  If mouth or throat bother you at all,  try brushing teeth/gums/tongue with arm and hammer toothpaste/ make a slurry and gargle and spit out.   Plan B = Backup (to supplement plan A, not to replace it) Only use your albuterol inhaler as a rescue medication to be used if you can't catch your breath by resting or doing a relaxed purse lip breathing pattern.  - The less you use it, the better it will work when you need it. - Ok to use the inhaler up to 2 puffs  every 4 hours if you must but call for appointment if use goes up over your usual need - Don't leave home without it !!  (think of it like the spare tire for your car)   Plan C = Crisis (instead of Plan B but only if Plan B stops working) - only use your albuterol nebulizer if you first try Plan B and it fails to help > ok to use the nebulizer up to every 4 hours but if start needing it regularly call for immediate appointment   Best cough mucinex dm 1200 mg every 12 hours as needed   Pantoprazole (protonix) 40 mg   Take  30-60 min before first meal of the day and Pepcid (famotidine)  20 mg after supper until return to office - this is the best way to tell whether stomach acid is contributing to your problem.    Please remember to go to the  x-ray department  for your tests - we will call you with the results when they are available    We will call to schedule a sinus ct scan and refer you to ENT if needed   We will schedule your for PFTs      Please schedule a follow up office visit in 4 weeks, sooner if needed - must bring all medications and inhalers

## 2020-11-14 ENCOUNTER — Encounter: Payer: Self-pay | Admitting: Internal Medicine

## 2020-11-14 ENCOUNTER — Other Ambulatory Visit: Payer: Self-pay | Admitting: Internal Medicine

## 2020-11-14 MED ORDER — HYDROXYZINE PAMOATE 25 MG PO CAPS: 25.0000 mg | ORAL_CAPSULE | Freq: Three times a day (TID) | ORAL | 0 refills | Status: DC | PRN

## 2020-11-14 NOTE — Assessment & Plan Note (Addendum)
Active smoker  - 11/13/2020  After extensive coaching inhaler device,  effectiveness =    80% with smi > try stiolto / finish pred rx then pfts   DDX of  difficult airways management almost all start with A and  include Adherence, Ace Inhibitors, Acid Reflux, Active Sinus Disease, Alpha 1 Antitripsin deficiency, Anxiety masquerading as Airways dz,  ABPA,  Allergy(esp in young), Aspiration (esp in elderly), Adverse effects of meds,  Active smoking or vaping, A bunch of PE's (a small clot burden can't cause this syndrome unless there is already severe underlying pulm or vascular dz with poor reserve) plus two Bs  = Bronchiectasis and Beta blocker use..and one C= CHF  Adherence is always the initial "prime suspect" and is a multilayered concern that requires a "trust but verify" approach in every patient - starting with knowing how to use medications, especially inhalers, correctly, keeping up with refills and understanding the fundamental difference between maintenance and prns vs those medications only taken for a very short course and then stopped and not refilled.  - see smi teaching above - return with all meds in hand using a trust but verify approach to confirm accurate Medication  Reconciliation The principal here is that until we are certain that the  patients are doing what we've asked, it makes no sense to ask them to do more.   Active smoking top of the rest of list of usual  Suspects, see sep a/p  ? Acid (or non-acid) GERD > always difficult to exclude as up to 75% of pts in some series report no assoc GI/ Heartburn symptoms> rec max (24h)  acid suppression and diet restrictions/ reviewed and instructions given in writing.   ? Active sinus dz > CT sinus next rather than continue empirical abx which have not helped  ? Allergy > finish pred rx and hold further eval until return   ? Anxiety/depression > usually at the bottom of this list of usual suspects but should be much higher on this  pt's based on H and P and note already on psychotropics and may interfere with adherence and also interpretation of response or lack thereof to symptom management which can be quite subjective.   ? Adverse drug effects > note on macrodantin though she's confused as to why she's taking it > advised ok for short course only in the setting of active pulmonary symptoms as this is the main side effects of macrobid = active pulmonary symptoms, which she reports were the same prior to starting short course of therapy.        Each maintenance medication was reviewed in detail including emphasizing most importantly the difference between maintenance and prns and under what circumstances the prns are to be triggered using an action plan format where appropriate.  Total time for H and P, chart review, counseling,  and generating customized AVS unique to this office visit / same day charting =  35 min

## 2020-11-14 NOTE — Assessment & Plan Note (Signed)
4-5 min discussion re active cigarette smoking in addition to office E&M  Ask about tobacco use:   ongoing Advise quitting  I took an extended  opportunity with this patient to outline the consequences of continued cigarette use  in airway disorders based on all the data we have from the multiple national lung health studies (perfomed over decades at millions of dollars in cost)  indicating that smoking cessation, not choice of inhalers or physicians, is the most important aspect of her care.   Assess willingness:  Not fully  committed at this point Assist in quit attempt:  Per PCP when ready Arrange follow up:   Follow up per Primary Care planned      

## 2020-11-14 NOTE — Telephone Encounter (Signed)
Medication refill for Hydroxyzine 25mg   last ov 10/23/20, upcoming ov 11/20/20 with 13/1/22 NP . Please advise

## 2020-11-15 ENCOUNTER — Other Ambulatory Visit: Payer: Self-pay | Admitting: Internal Medicine

## 2020-11-15 MED ORDER — HYDROXYZINE PAMOATE 25 MG PO CAPS: 25.0000 mg | ORAL_CAPSULE | Freq: Three times a day (TID) | ORAL | 0 refills | Status: DC | PRN

## 2020-11-15 NOTE — Telephone Encounter (Signed)
Pl schedule fu with me in 1 month thnx. Not sure why shes scheduled with lauren.

## 2020-11-15 NOTE — Telephone Encounter (Signed)
I called pt and changed her appointment to have a physical with you 11/2

## 2020-11-15 NOTE — Telephone Encounter (Signed)
Yes needs to be seen will sent off as courtesy for 1 month. She needs to fu with me thnx.

## 2020-11-16 LAB — URINE CULTURE

## 2020-11-16 NOTE — Progress Notes (Signed)
Hi Nena. Your urine grew a bacteria that should be taken care of by the Macrobid that you were treated with in the office.  Follow up if symptoms do not improve.

## 2020-11-19 ENCOUNTER — Emergency Department: Payer: 59

## 2020-11-19 ENCOUNTER — Emergency Department
Admission: EM | Admit: 2020-11-19 | Discharge: 2020-11-19 | Disposition: A | Discharge status: Home / Self Care | Payer: 59 | Attending: Emergency Medicine | Admitting: Emergency Medicine

## 2020-11-19 ENCOUNTER — Other Ambulatory Visit: Payer: Self-pay

## 2020-11-19 DIAGNOSIS — Z7951 Long term (current) use of inhaled steroids: Secondary | ICD-10-CM | POA: Diagnosis not present

## 2020-11-19 DIAGNOSIS — Z79899 Other long term (current) drug therapy: Secondary | ICD-10-CM | POA: Insufficient documentation

## 2020-11-19 DIAGNOSIS — M5412 Radiculopathy, cervical region: Secondary | ICD-10-CM | POA: Diagnosis not present

## 2020-11-19 DIAGNOSIS — R4182 Altered mental status, unspecified: Secondary | ICD-10-CM | POA: Insufficient documentation

## 2020-11-19 DIAGNOSIS — F17210 Nicotine dependence, cigarettes, uncomplicated: Secondary | ICD-10-CM | POA: Diagnosis not present

## 2020-11-19 DIAGNOSIS — M79604 Pain in right leg: Secondary | ICD-10-CM | POA: Insufficient documentation

## 2020-11-19 DIAGNOSIS — R410 Disorientation, unspecified: Secondary | ICD-10-CM | POA: Diagnosis not present

## 2020-11-19 DIAGNOSIS — M542 Cervicalgia: Secondary | ICD-10-CM | POA: Insufficient documentation

## 2020-11-19 DIAGNOSIS — J449 Chronic obstructive pulmonary disease, unspecified: Secondary | ICD-10-CM | POA: Diagnosis not present

## 2020-11-19 DIAGNOSIS — J45909 Unspecified asthma, uncomplicated: Secondary | ICD-10-CM | POA: Diagnosis not present

## 2020-11-19 DIAGNOSIS — R209 Unspecified disturbances of skin sensation: Secondary | ICD-10-CM | POA: Diagnosis not present

## 2020-11-19 DIAGNOSIS — M79601 Pain in right arm: Secondary | ICD-10-CM | POA: Insufficient documentation

## 2020-11-19 LAB — CBC WITH DIFFERENTIAL/PLATELET
Abs Immature Granulocytes: 0.05 10*3/uL (ref 0.00–0.07)
Basophils Absolute: 0.1 10*3/uL (ref 0.0–0.1)
Basophils Relative: 1 %
Eosinophils Absolute: 0.4 10*3/uL (ref 0.0–0.5)
Eosinophils Relative: 3 %
HCT: 45.3 % (ref 36.0–46.0)
Hemoglobin: 14.9 g/dL (ref 12.0–15.0)
Immature Granulocytes: 0 %
Lymphocytes Relative: 28 %
Lymphs Abs: 3.6 10*3/uL (ref 0.7–4.0)
MCH: 30 pg (ref 26.0–34.0)
MCHC: 32.9 g/dL (ref 30.0–36.0)
MCV: 91.3 fL (ref 80.0–100.0)
Monocytes Absolute: 0.8 10*3/uL (ref 0.1–1.0)
Monocytes Relative: 6 %
Neutro Abs: 7.8 10*3/uL — ABNORMAL HIGH (ref 1.7–7.7)
Neutrophils Relative %: 62 %
Platelets: 282 10*3/uL (ref 150–400)
RBC: 4.96 MIL/uL (ref 3.87–5.11)
RDW: 13.7 % (ref 11.5–15.5)
WBC: 12.7 10*3/uL — ABNORMAL HIGH (ref 4.0–10.5)
nRBC: 0 % (ref 0.0–0.2)

## 2020-11-19 LAB — URINALYSIS, ROUTINE W REFLEX MICROSCOPIC
Bilirubin Urine: NEGATIVE
Glucose, UA: NEGATIVE mg/dL
Hgb urine dipstick: NEGATIVE
Ketones, ur: NEGATIVE mg/dL
Leukocytes,Ua: NEGATIVE
Nitrite: NEGATIVE
Protein, ur: NEGATIVE mg/dL
Specific Gravity, Urine: 1.01 (ref 1.005–1.030)
pH: 6 (ref 5.0–8.0)

## 2020-11-19 LAB — COMPREHENSIVE METABOLIC PANEL
ALT: 16 U/L (ref 0–44)
AST: 15 U/L (ref 15–41)
Albumin: 4.2 g/dL (ref 3.5–5.0)
Alkaline Phosphatase: 41 U/L (ref 38–126)
Anion gap: 8 (ref 5–15)
BUN: 16 mg/dL (ref 6–20)
CO2: 33 mmol/L — ABNORMAL HIGH (ref 22–32)
Calcium: 9.9 mg/dL (ref 8.9–10.3)
Chloride: 96 mmol/L — ABNORMAL LOW (ref 98–111)
Creatinine, Ser: 0.94 mg/dL (ref 0.44–1.00)
GFR, Estimated: >60 mL/min (ref >60)
Glucose, Bld: 96 mg/dL (ref 70–99)
Potassium: 4 mmol/L (ref 3.5–5.1)
Sodium: 137 mmol/L (ref 135–145)
Total Bilirubin: 0.7 mg/dL (ref 0.3–1.2)
Total Protein: 7 g/dL (ref 6.5–8.1)

## 2020-11-19 LAB — URINE DRUG SCREEN, QUALITATIVE (ARMC ONLY)
Amphetamines, Ur Screen: NOT DETECTED
Barbiturates, Ur Screen: NOT DETECTED
Benzodiazepine, Ur Scrn: POSITIVE — AB
Cannabinoid 50 Ng, Ur ~~LOC~~: NOT DETECTED
Cocaine Metabolite,Ur ~~LOC~~: NOT DETECTED
MDMA (Ecstasy)Ur Screen: NOT DETECTED
Methadone Scn, Ur: NOT DETECTED
Opiate, Ur Screen: NOT DETECTED
Phencyclidine (PCP) Ur S: NOT DETECTED
Tricyclic, Ur Screen: POSITIVE — AB

## 2020-11-19 LAB — TROPONIN I (HIGH SENSITIVITY)
Troponin I (High Sensitivity): 3 ng/L (ref <18)
Troponin I (High Sensitivity): 3 ng/L (ref <18)

## 2020-11-19 IMAGING — CR DG CHEST 2V
1 series · 2 of 2 positions shown · non-contrast
Comparison: Chest x-ray dated [DATE].

CLINICAL DATA: Confusion.  Weakness.

EXAM:
CHEST - 2 VIEW

[Series 1: dg chest 2 view · 0.14mm/px · 2 of 2 slices shown]
[im 1/2]
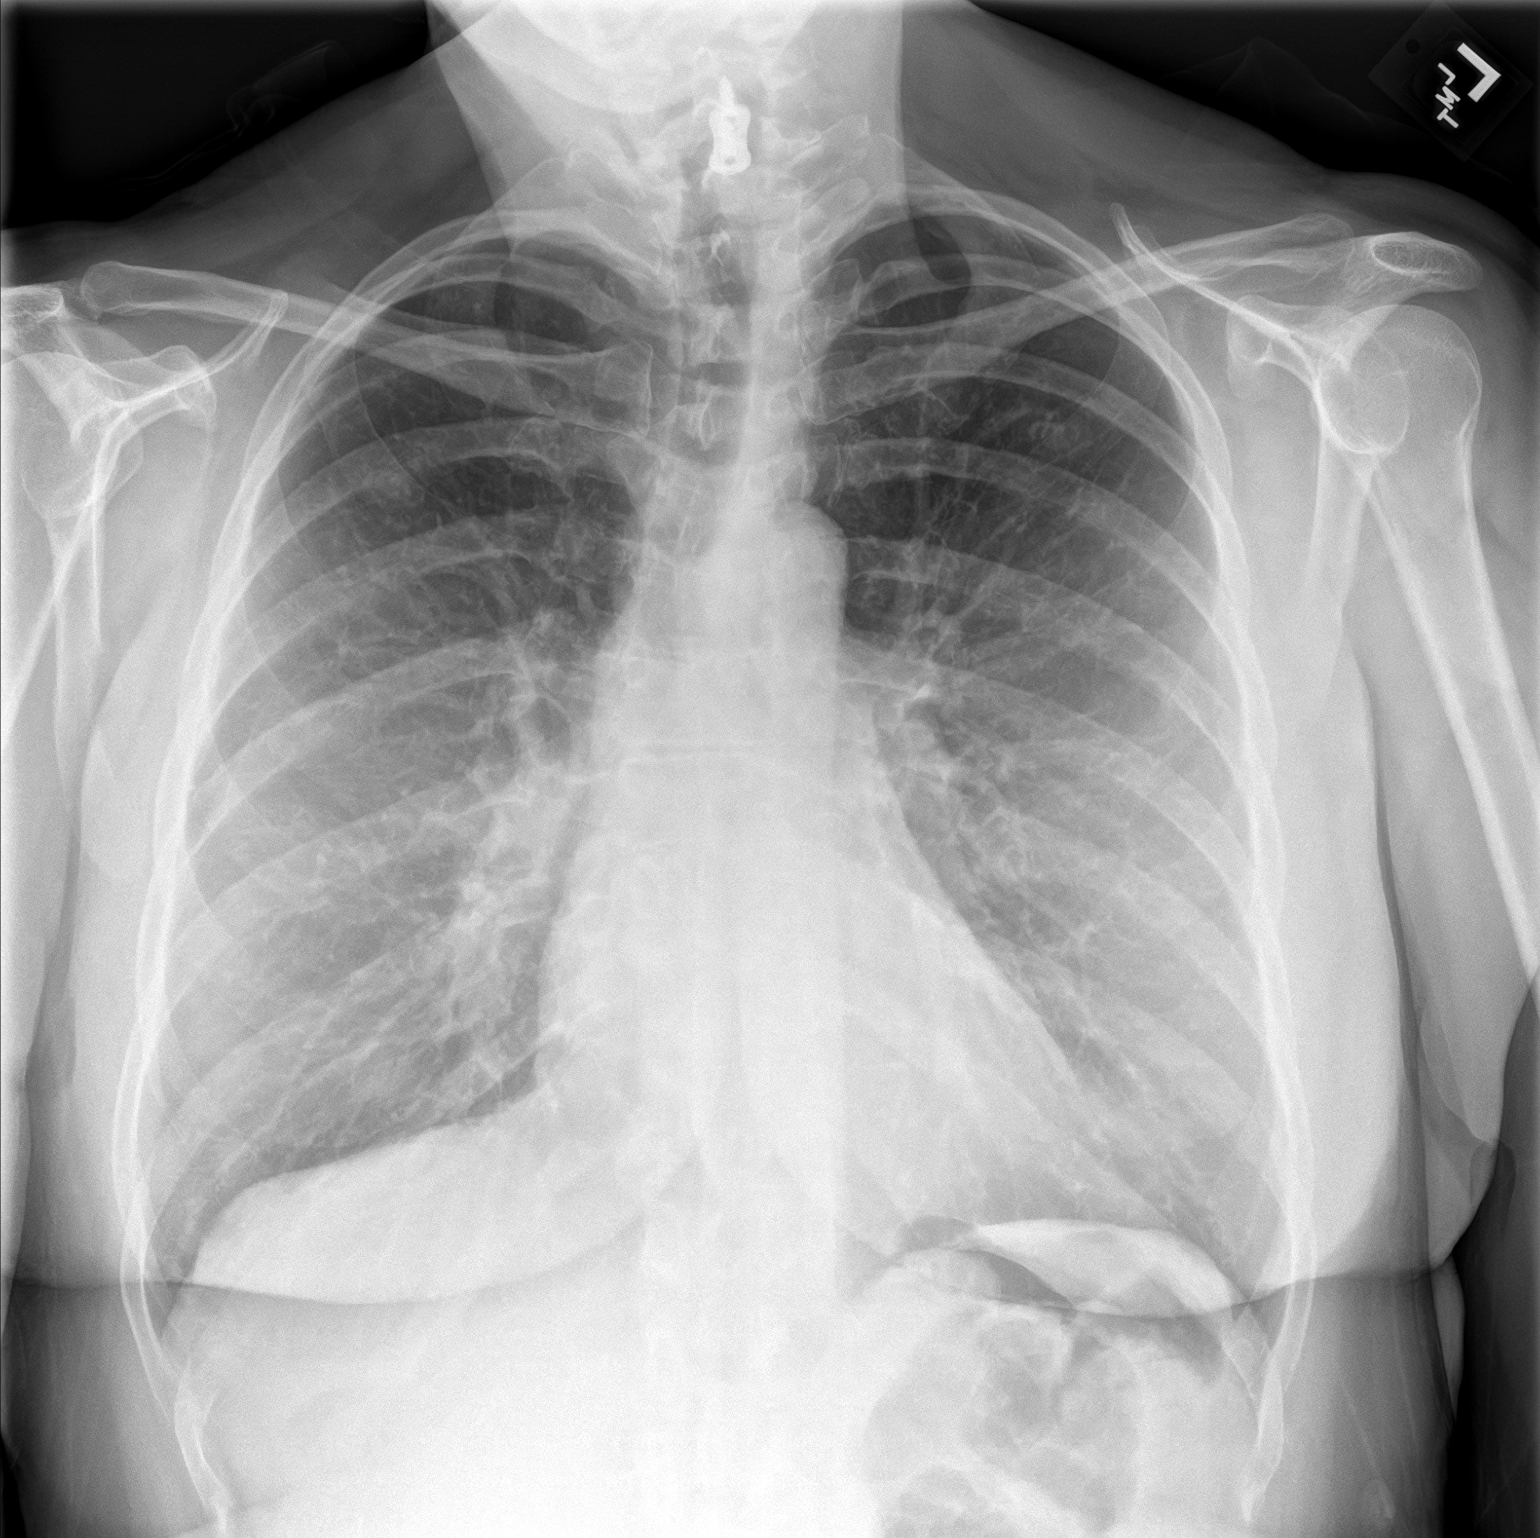
[im 2/2]
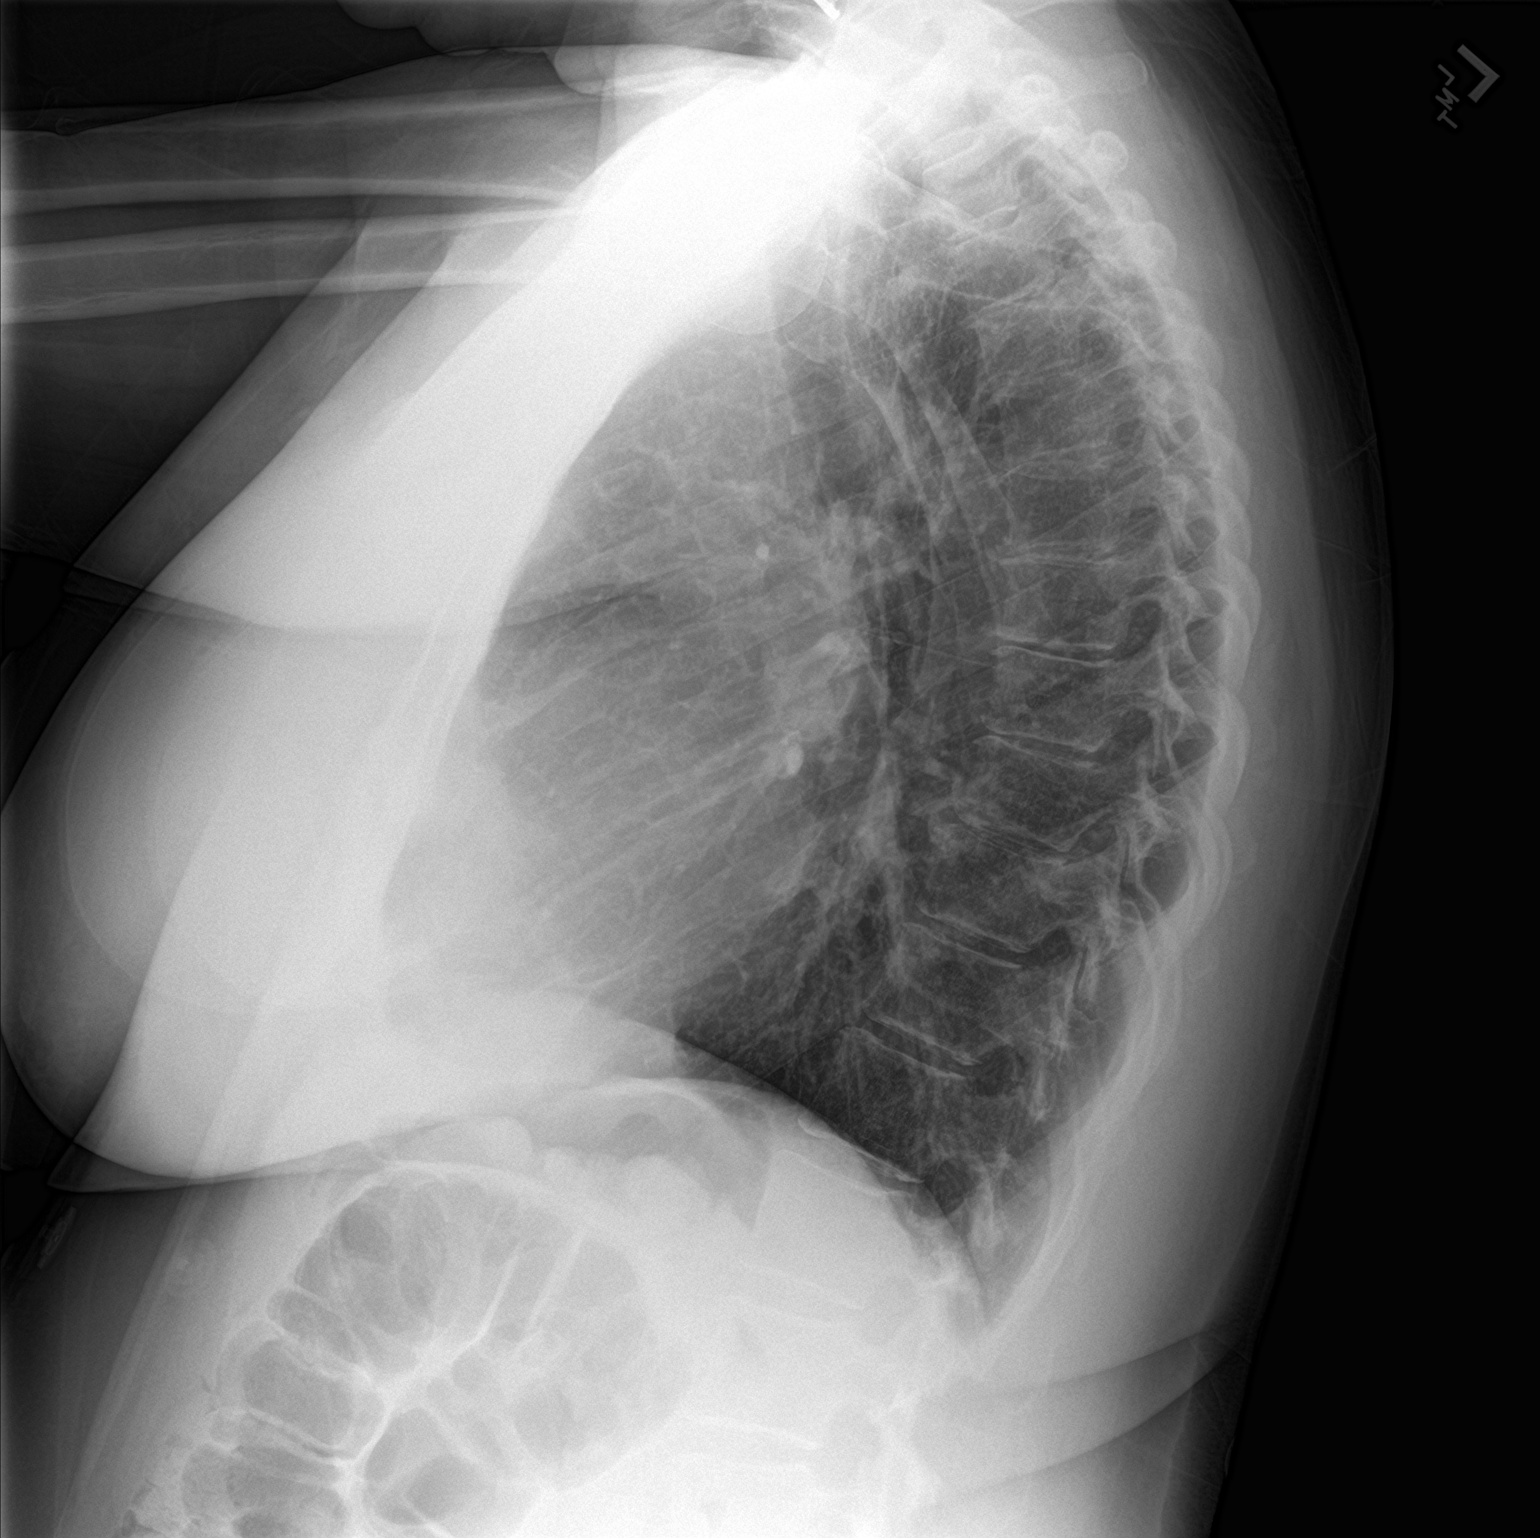

[2 of 2 positions shown; findings below may reference images not displayed]

FINDINGS: The heart size and mediastinal contours are within normal limits.
Normal pulmonary vascularity. Unchanged mildly coarsened
interstitial markings, likely smoking-related. No focal
consolidation, pleural effusion, or pneumothorax. No acute osseous
abnormality.
IMPRESSION: No active cardiopulmonary disease.

## 2020-11-19 IMAGING — CT CT CERVICAL SPINE W/O CM
3 of 4 series · 12 of 33 positions shown, 14 images · non-contrast
Comparison: CT head dated [DATE].

CLINICAL DATA: Altered mental status and confusion. Diffuse pain
throughout her body. No history of injury.

EXAM:
CT HEAD WITHOUT CONTRAST
CT CERVICAL SPINE WITHOUT CONTRAST
TECHNIQUE: Multidetector CT imaging of the head and cervical spine was
performed following the standard protocol without intravenous
contrast. Multiplanar CT image reconstructions of the cervical spine
were also generated.

[Series 4: sagittal bone · sagittal · 0.31mm/px · 5 of 116 slices shown, 6 images]
[im 39/116  bone]
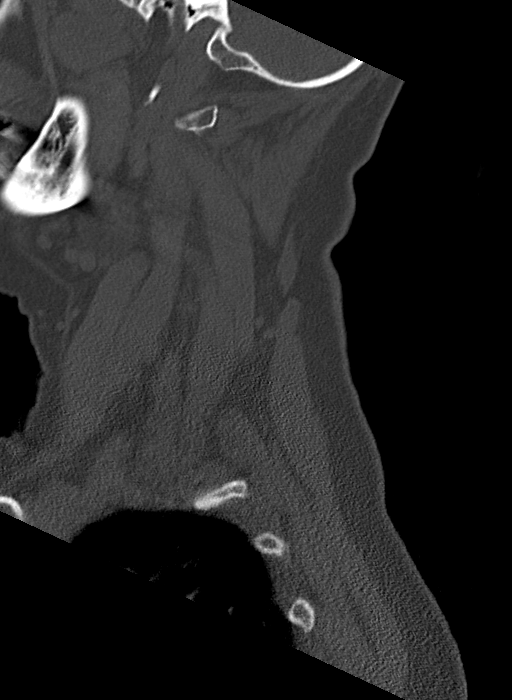
[im 48/116  bone]
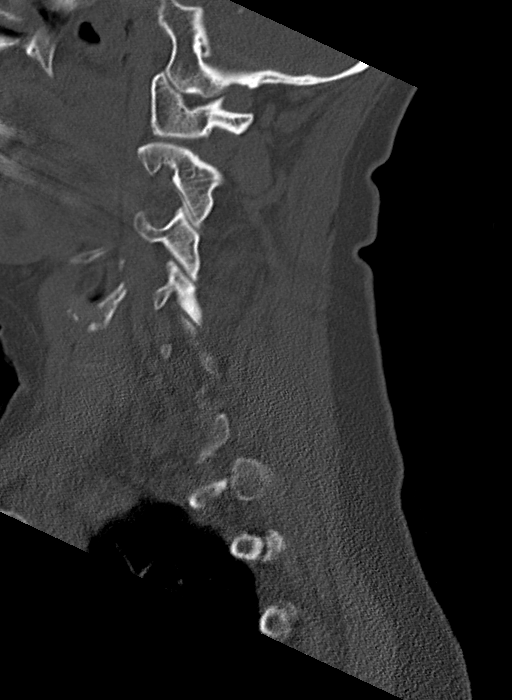
[im 58/116  soft-tissue]
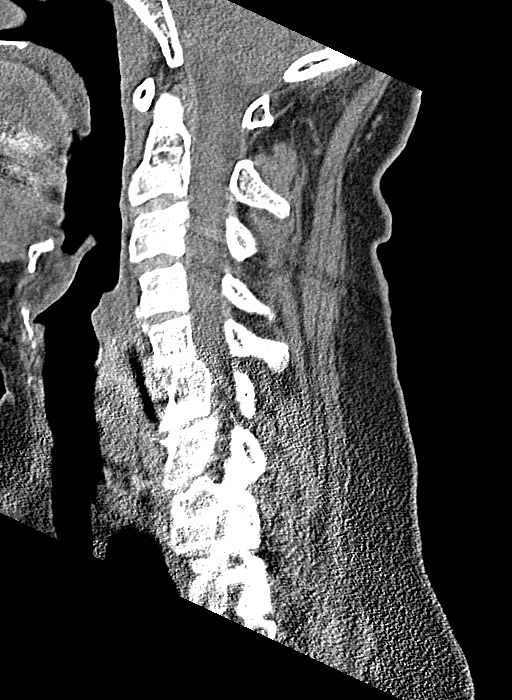
[im 58/116  bone]
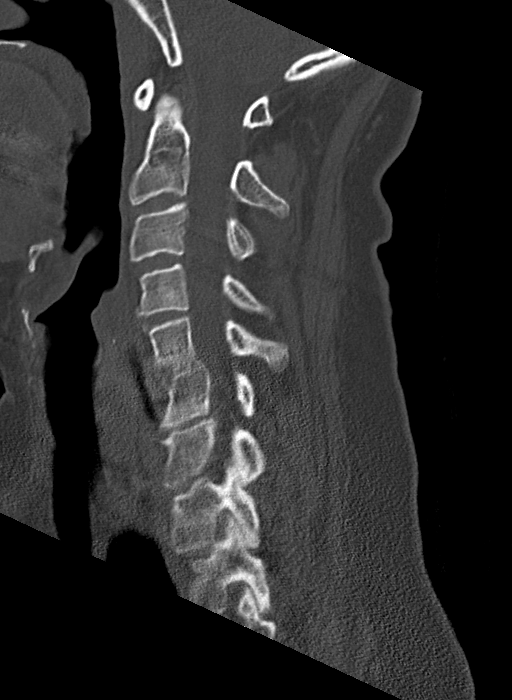
[im 68/116  bone]
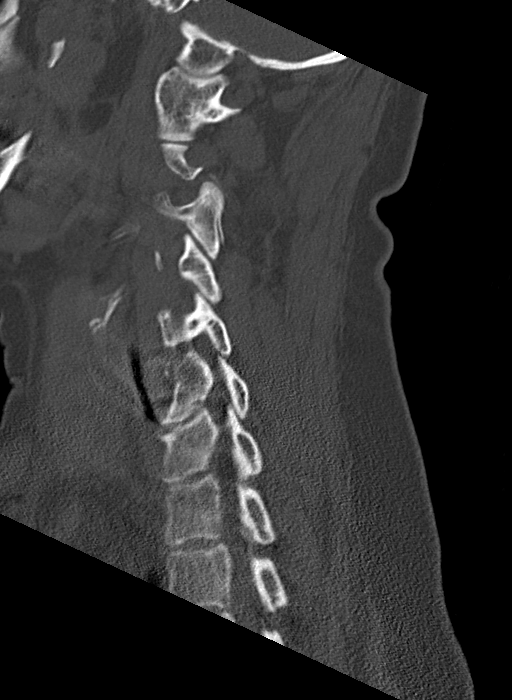
[im 77/116  bone]
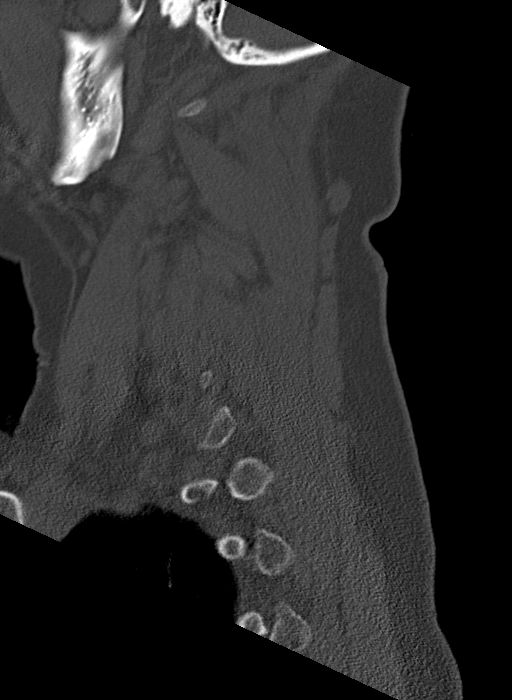

[Series 5: coronal bone · coronal · 0.43mm/px · 3 of 81 slices shown]
[im 23/81  bone]
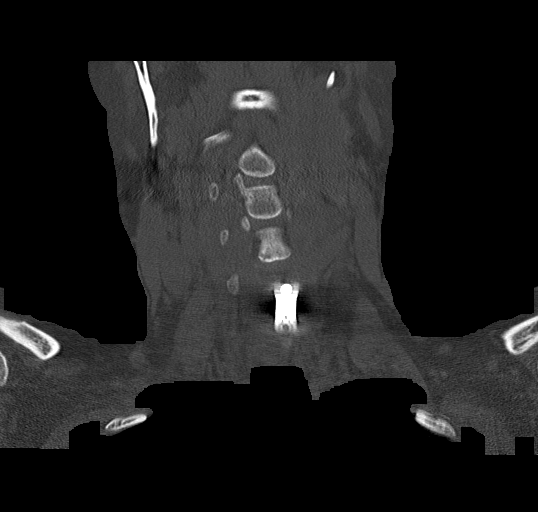
[im 35/81  bone]
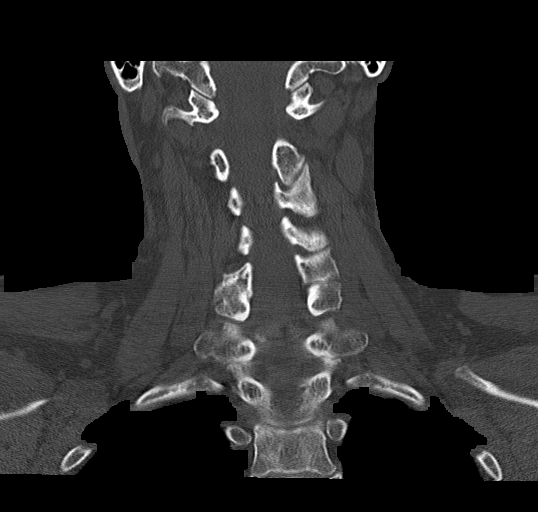
[im 46/81  bone]
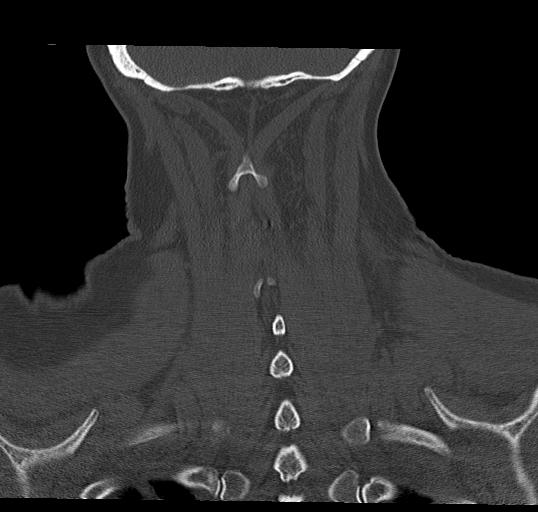

[Series 6: orthogonal bone · axial · 0.31mm/px · z∈[-182,-39]mm · 4 of 111 slices shown, 5 images]
[im 16/111  soft-tissue]
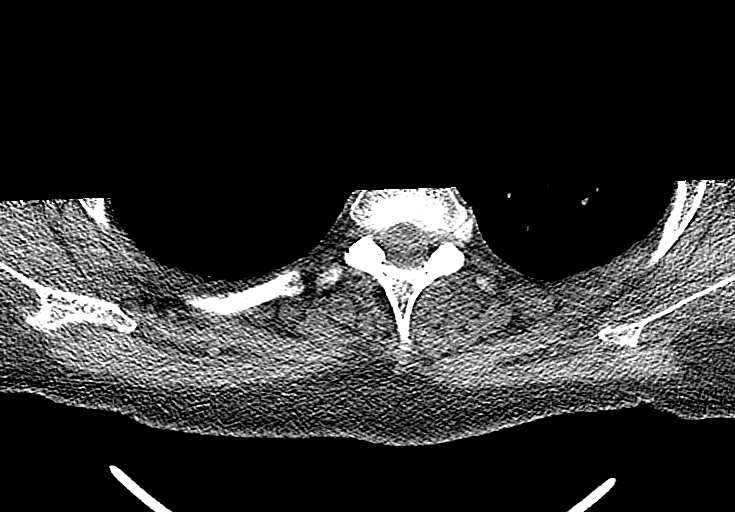
[im 16/111  bone]
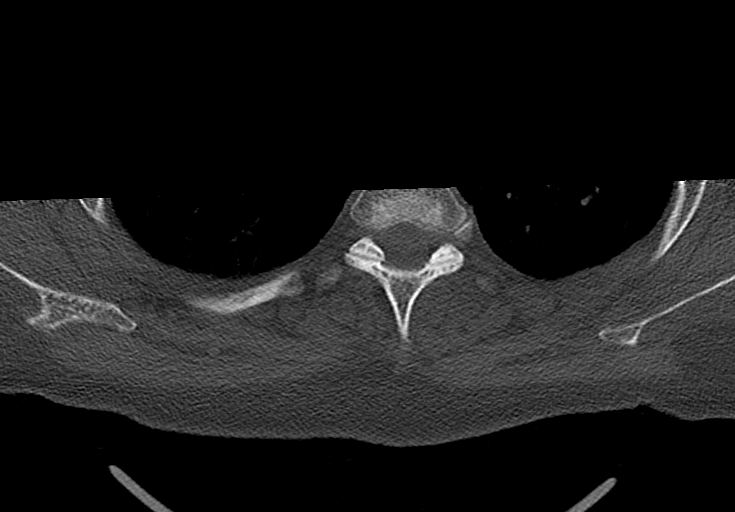
[im 48/111  bone]
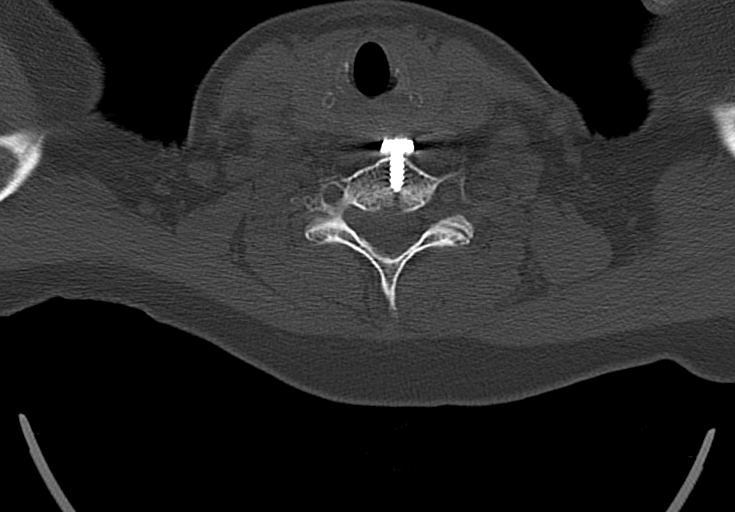
[im 63/111  bone]
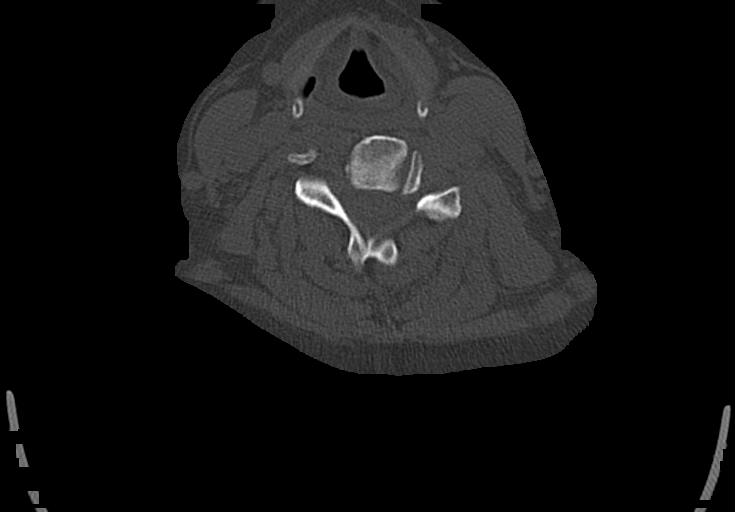
[im 95/111  bone]
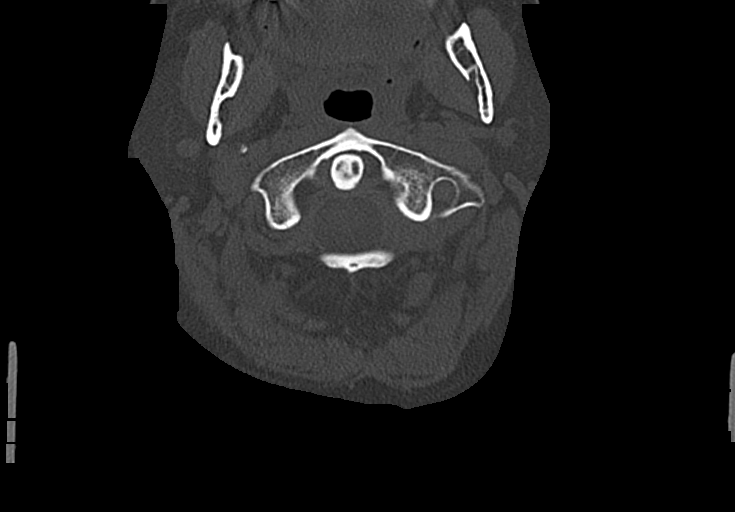

[12 of 33 positions shown; findings below may reference images not displayed]

FINDINGS: CT HEAD FINDINGS

Brain: No evidence of acute infarction, hemorrhage, hydrocephalus,
extra-axial collection or mass lesion/mass effect.

Vascular: No hyperdense vessel or unexpected calcification.

Skull: Normal. Negative for fracture or focal lesion.

Sinuses/Orbits: No acute finding.

Other: None.

CT CERVICAL SPINE FINDINGS

Alignment: Mild levocurvature. Trace fused anterolisthesis at C5-C6.

Skull base and vertebrae: No acute fracture. No primary bone lesion
or focal pathologic process. Prior C5-C6 ACDF with solid osseous
fusion.

Soft tissues and spinal canal: No prevertebral fluid or swelling. No
visible canal hematoma.

Disc levels:  Moderate disc height loss at C6-C7.

Upper chest: Negative.

Other: None.
IMPRESSION: 1. No acute intracranial abnormality.
2. No acute cervical spine fracture or traumatic listhesis.
3. Prior C5-C6 ACDF with moderate adjacent segment disease at C6-C7.

## 2020-11-19 IMAGING — CT CT HEAD W/O CM
3 series · 15 of 45 positions shown, 18 images · non-contrast
Comparison: CT head dated [DATE].

CLINICAL DATA: Altered mental status and confusion. Diffuse pain
throughout her body. No history of injury.

EXAM:
CT HEAD WITHOUT CONTRAST
CT CERVICAL SPINE WITHOUT CONTRAST
TECHNIQUE: Multidetector CT imaging of the head and cervical spine was
performed following the standard protocol without intravenous
contrast. Multiplanar CT image reconstructions of the cervical spine
were also generated.

[Series 3: head wo · axial · 0.39mm/px · z∈[+28,+143]mm · 9 of 28 slices shown, 12 images]
[im 3/28  brain]
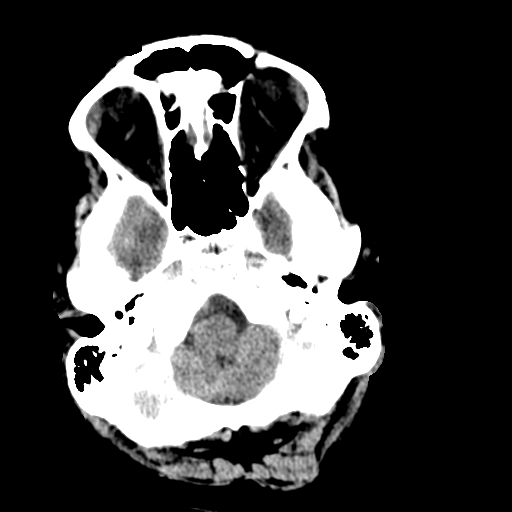
[im 3/28  bone]
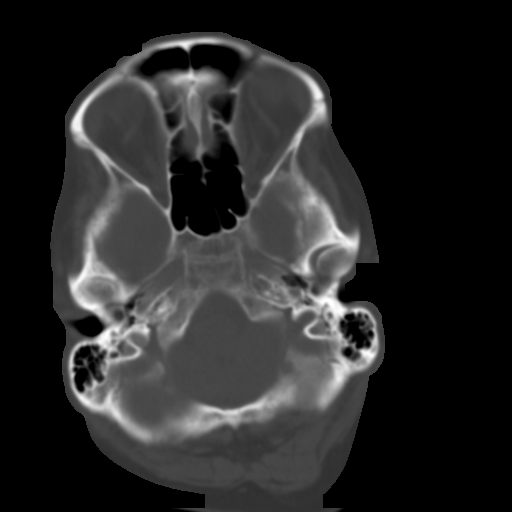
[im 6/28  brain]
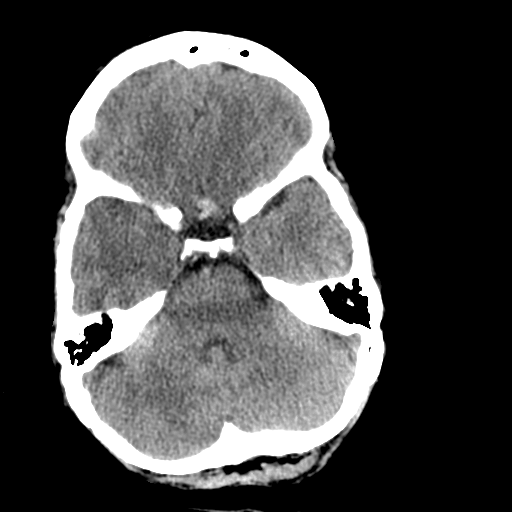
[im 9/28  brain]
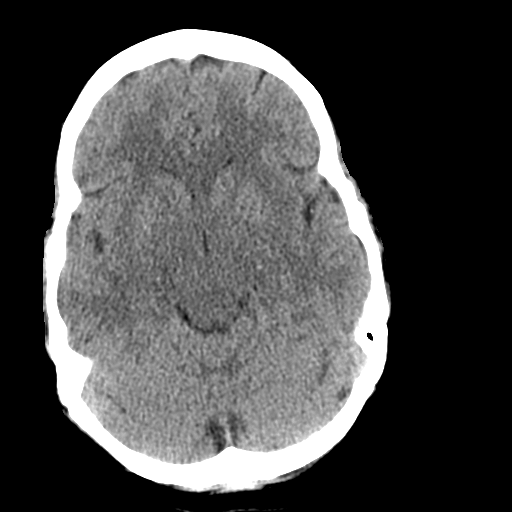
[im 12/28  brain]
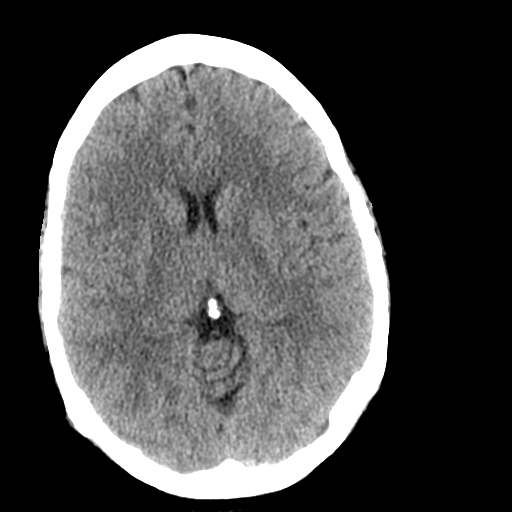
[im 15/28  brain]
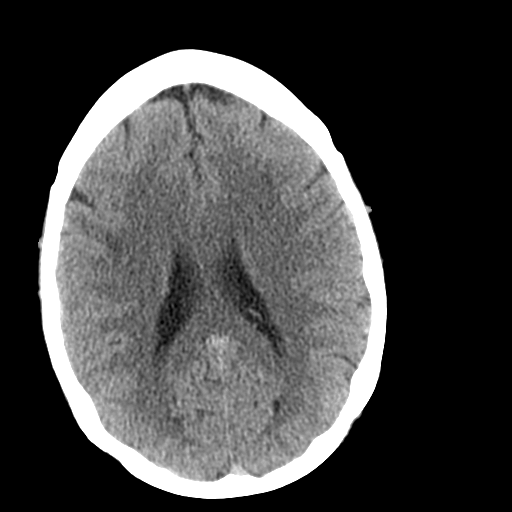
[im 15/28  bone]
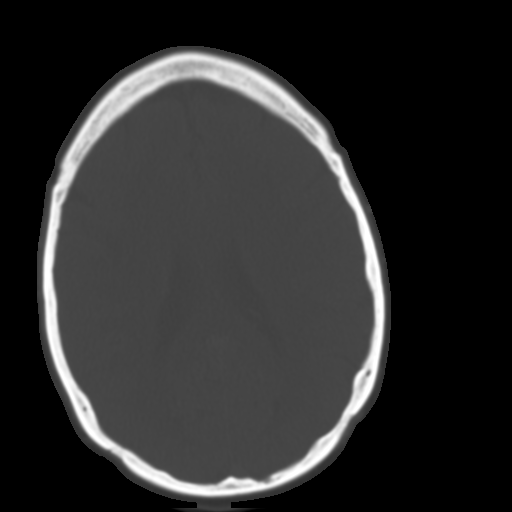
[im 17/28  brain]
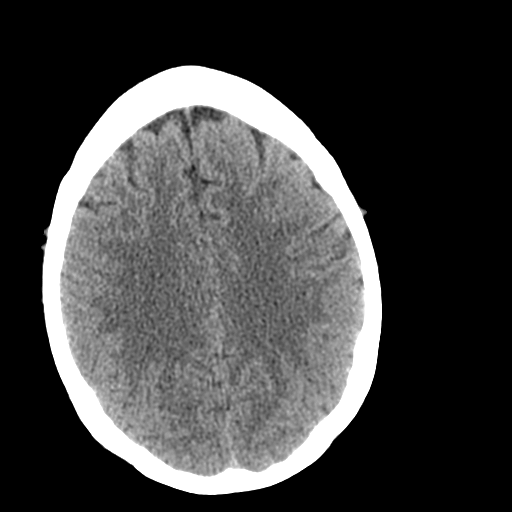
[im 20/28  brain]
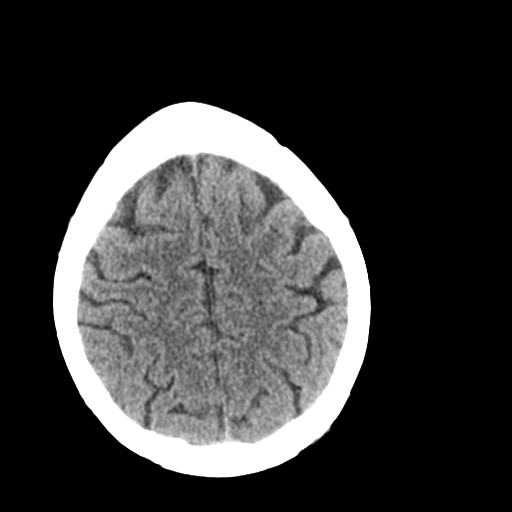
[im 23/28  brain]
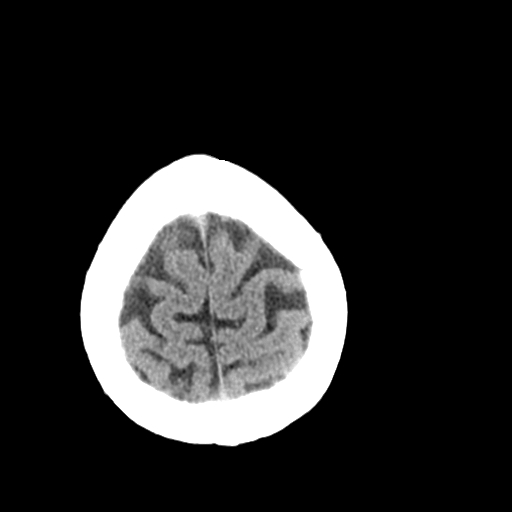
[im 26/28  brain]
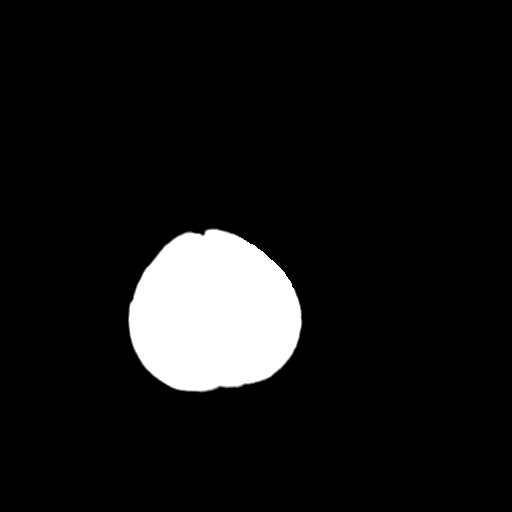
[im 26/28  bone]
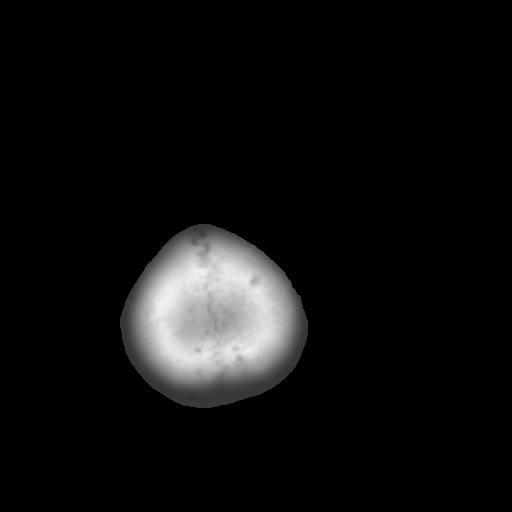

[Series 4: coronal soft tissue · coronal · 0.29mm/px · 3 of 65 slices shown]
[im 22/65  brain]
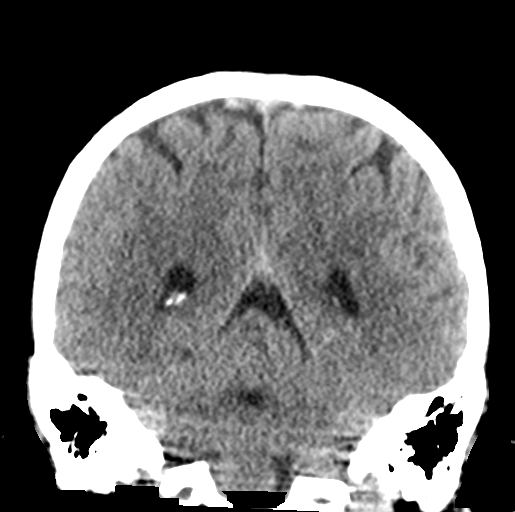
[im 29/65  brain]
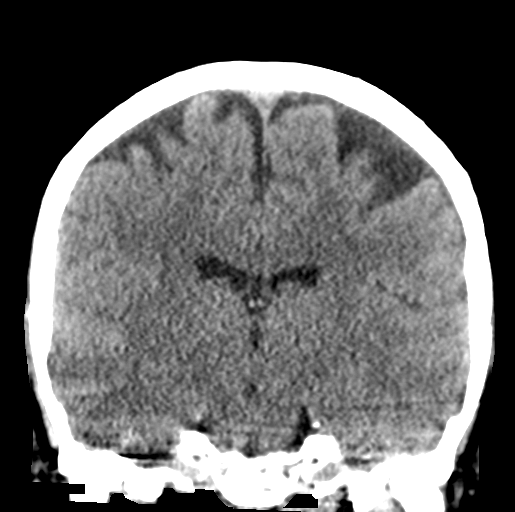
[im 36/65  brain]
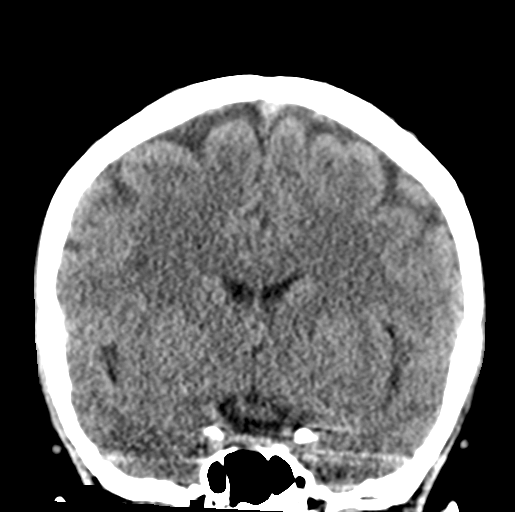

[Series 5: sagittal soft tissue · sagittal · 0.29mm/px · 3 of 51 slices shown]
[im 17/51  brain]
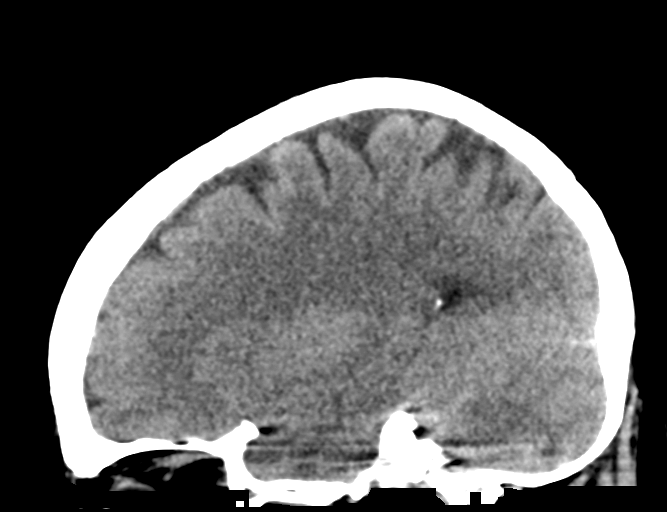
[im 26/51  brain]
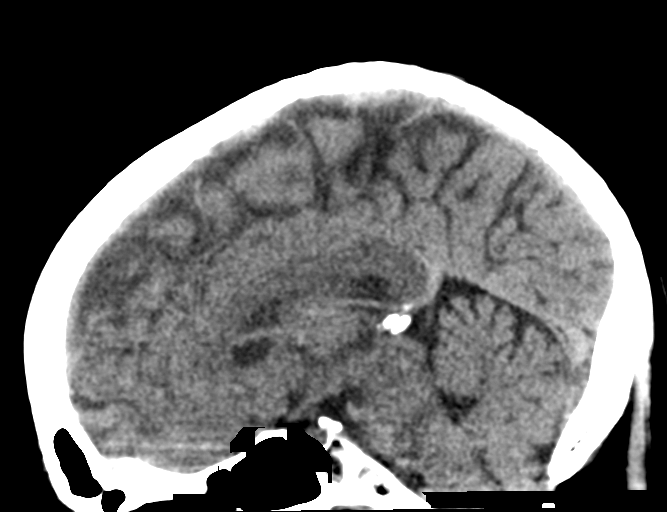
[im 34/51  brain]
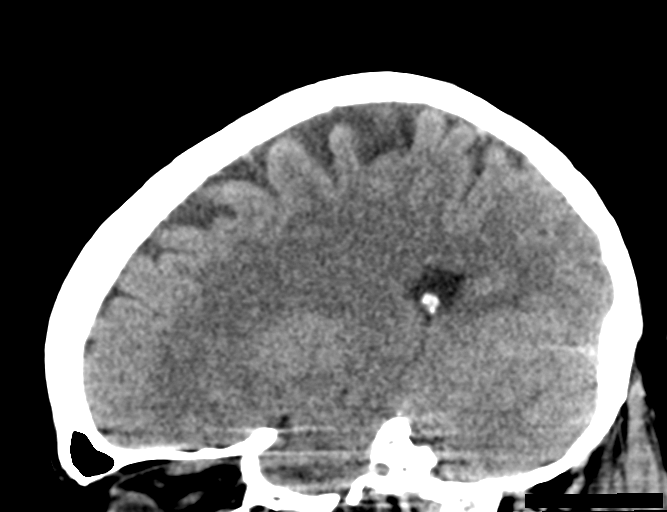

[15 of 45 positions shown; findings below may reference images not displayed]

FINDINGS: CT HEAD FINDINGS

Brain: No evidence of acute infarction, hemorrhage, hydrocephalus,
extra-axial collection or mass lesion/mass effect.

Vascular: No hyperdense vessel or unexpected calcification.

Skull: Normal. Negative for fracture or focal lesion.

Sinuses/Orbits: No acute finding.

Other: None.

CT CERVICAL SPINE FINDINGS

Alignment: Mild levocurvature. Trace fused anterolisthesis at C5-C6.

Skull base and vertebrae: No acute fracture. No primary bone lesion
or focal pathologic process. Prior C5-C6 ACDF with solid osseous
fusion.

Soft tissues and spinal canal: No prevertebral fluid or swelling. No
visible canal hematoma.

Disc levels:  Moderate disc height loss at C6-C7.

Upper chest: Negative.

Other: None.
IMPRESSION: 1. No acute intracranial abnormality.
2. No acute cervical spine fracture or traumatic listhesis.
3. Prior C5-C6 ACDF with moderate adjacent segment disease at C6-C7.

## 2020-11-19 NOTE — ED Triage Notes (Signed)
Pt to ED for swelling to tongue for the past few days and feeling "off and confused". No swelling noted to facial area, able to swallow saliva.  Clear speech.  Alert and oriented, answers questions appropriately then says, "see I'm dumb" LKW a couple weeks ago

## 2020-11-19 NOTE — ED Provider Notes (Signed)
[facility] Emergency Department Provider Note   ____________________________________________   Event Date/Time   First MD Initiated Contact with Patient 11/19/20 1225     (approximate)  I have reviewed the triage vital signs and the nursing notes.   HISTORY  Chief Complaint Altered Mental Status    HPI Gwendolyn Hansen is a 50 y.o. female with past medical history of COPD, generalized anxiety disorder, and polysubstance abuse who presents to the ED complaining of neck pain and confusion.  Patient reports that she has been having 24 hours of pain starting in her neck and radiating down her right arm as well as towards her right leg.  She describes the pain as constant and sharp, worse with certain positions.  She will occasionally feel like she has spasms in the muscles of her right arm and right leg, additionally describes some numbness and tingling going into these extremities.  She has not noticed any weakness in the extremities.  She denies any recent trauma to her head or neck, does have prior neck surgery in 2010 but has not seen neurosurgery in some time.  Sister is also concerned that patient has seemed more disoriented than usual.  She has been awake and alert, but has difficulty remembering things at times and will occasionally seem confused.  She has been taking a course of steroids and antibiotics prescribed by her PCP for sinus infection.  She denies any fevers, cough, chest pain, shortness of breath, vomiting, diarrhea, dysuria, or abdominal pain.        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 Authorizing Provider  albuterol (PROVENTIL) (2.5 MG/3ML) 0.083% nebulizer solution Take 3 mLs (2.5 mg total) by nebulization every 4 (four) hours as needed. 11/13/20   Tanda Rockers, MD  albuterol (VENTOLIN HFA) 108 (90 Base) MCG/ACT inhaler Inhale 2 puffs into the lungs every 6 (six) hours as needed for wheezing or shortness of breath. 10/04/20   [name], [name], MD  amitriptyline (ELAVIL) 150 MG tablet Take 1 tablet (150 mg total) by mouth at bedtime. 11/12/20   Jon Billings, NP  buprenorphine (SUBUTEX) 8 MG SUBL SL tablet Place 4 mg under the tongue daily. 08/16/15   [provider]  diazepam (VALIUM) 10 MG tablet Take 10 mg by mouth 2 (two) times daily. 11/02/20   [provider]  hydrOXYzine (VISTARIL) 25 MG capsule Take 1 capsule (25 mg total) by mouth every 8 (eight) hours as needed. needs appointment for further refills 11/15/20   [name], [name], MD  meclizine (ANTIVERT) 25 MG tablet Take 1 tablet (25 mg total) by mouth 3 (three) times daily as needed for dizziness. 11/12/20   Larae Grooms, NP  montelukast (SINGULAIR) 10 MG tablet Take 10 mg by mouth daily. 08/25/20   [provider]  nicotine (NICODERM CQ - DOSED IN  MG/24 HOURS) 21 mg/24hr patch Place 1 patch (21 mg total) onto the skin daily. 10/04/20   [name], [name], MD  nitrofurantoin, macrocrystal-monohydrate, (MACROBID) 100 MG capsule Take 1 capsule (100 mg total) by mouth 2 (two) times daily. 11/12/20   Larae Grooms, NP  predniSONE (DELTASONE) 10 MG tablet Take 1 tablet (10 mg total) by mouth daily with breakfast. Take 6 today, 5 tomorrow and decrease by 1 each day until the course is complete. 11/12/20   Larae Grooms, NP  Tiotropium Bromide-Olodaterol (STIOLTO RESPIMAT) 2.5-2.5 MCG/ACT AERS Inhale 2 puffs into the lungs daily. 11/13/20   Nyoka Cowden, MD    Allergies Naloxone  Family History  Problem Relation Age of Onset   COPD Mother    Anxiety disorder Mother    Emphysema Father    Alcohol abuse Father    Anxiety disorder Father    Leukemia Father    Anxiety disorder Sister    Bipolar disorder Sister    Colon cancer Maternal Grandmother     Social History Social History   Tobacco Use   Smoking status: Every Day    Packs/day: 2.00    Years: 28.00    Pack years: 56.00    Types: Cigarettes   Smokeless tobacco: Never   Tobacco comments:    1ppd-11/13/2020  Vaping Use   Vaping Use: Some days   Substances: Nicotine, Flavoring  Substance Use Topics   Alcohol use: No    Alcohol/week: 0.0 standard drinks    Comment: quit drinking in 2010   Drug use: No    Review of Systems  Constitutional: No fever/chills.  Positive for confusion Eyes: No visual changes. ENT: No sore throat. Cardiovascular: Denies chest pain. Respiratory: Denies shortness of breath. Gastrointestinal: No abdominal pain.  No nausea, no vomiting.  No diarrhea.  No constipation. Genitourinary: Negative for dysuria. Musculoskeletal: Negative for back pain.  Positive for neck pain. Skin: Negative for rash. Neurological: Negative for headaches or weakness, positive for numbness and tingling.  ____________________________________________   PHYSICAL  EXAM:  VITAL SIGNS: ED Triage Vitals  Enc Vitals Group     BP 11/19/20 1217 (!) 127/92     Pulse Rate 11/19/20 1217 92     Resp 11/19/20 1217 20     Temp 11/19/20 1212 98.1 F (36.7 C)     Temp Source 11/19/20 1602 Oral     SpO2 11/19/20 1217 93 %     Weight 11/19/20 1218 196 lb (88.9 kg)     Height 11/19/20 1218 5\' 11"  (1.803 m)     Head Circumference --      Peak Flow --      Pain Score 11/19/20 1217 4     Pain Loc --      Pain Edu? --      Excl. in GC? --     Constitutional: Alert and oriented. Eyes: Conjunctivae are normal. Head: Atraumatic. Nose: No congestion/rhinnorhea. Mouth/Throat: Mucous membranes are moist. Neck: Normal ROM Cardiovascular: Normal rate, regular  rhythm. Grossly normal heart sounds.  2+ radial pulses bilaterally. Respiratory: Normal respiratory effort.  No retractions. Lungs CTAB. Gastrointestinal: Soft and nontender. No distention. Genitourinary: deferred Musculoskeletal: No lower extremity tenderness nor edema. Neurologic:  Normal speech and language. No gross focal neurologic deficits are appreciated, strength 5 out of 5 in bilateral upper and lower extremities. Skin:  Skin is warm, dry and intact. No rash noted. Psychiatric: Mood and affect are normal. Speech and behavior are normal.  ____________________________________________   LABS (all labs ordered are listed, but only abnormal results are displayed)  Labs Reviewed  URINALYSIS, ROUTINE W REFLEX MICROSCOPIC - Abnormal; Notable for the following components:      Result Value   Color, Urine YELLOW (*)    APPearance CLEAR (*)    All other components within normal limits  URINE DRUG SCREEN, QUALITATIVE ([facility] ONLY) - Abnormal; Notable for the following components:   Tricyclic, Ur Screen POSITIVE (*)    Benzodiazepine, Ur Scrn POSITIVE (*)    All other components within normal limits  COMPREHENSIVE METABOLIC PANEL - Abnormal; Notable for the following components:   Chloride 96 (*)     CO2 33 (*)    All other components within normal limits  CBC WITH DIFFERENTIAL/PLATELET - Abnormal; Notable for the following components:   WBC 12.7 (*)    Neutro Abs 7.8 (*)    All other components within normal limits  TROPONIN I (HIGH SENSITIVITY)  TROPONIN I (HIGH SENSITIVITY)   ____________________________________________  EKG  ED ECG REPORT I, Blake Divine, the attending physician, personally viewed and interpreted this ECG.   Date: 11/19/2020  EKG Time: 12:27  Rate: 91  Rhythm: normal sinus rhythm  Axis: LAD  Intervals:none  ST&T Change: None   PROCEDURES  Procedure(s) performed (including Critical Care):  Procedures   ____________________________________________   INITIAL IMPRESSION / ASSESSMENT AND PLAN / ED COURSE      50 year old female with past medical history of COPD, generalized anxiety disorder, and polysubstance abuse who presents to the ED complaining of 24 hours of pain radiating from her neck into her right arm and right leg with associated numbness and tingling.  Patient is neurovascularly intact to all 4 extremities, strength is also intact.  CT head and cervical spine were performed, negative for acute process and shows appropriate positioning of neck hardware.  Symptoms seem consistent with a cervical radiculopathy and I suspect her disorientation is related to recent course of steroids.  Patient denies recent drug use and labs are reassuring.  EKG shows no evidence of arrhythmia or ischemia, chest x-ray reviewed by me and shows no infiltrate, edema, or effusion.  Patient counseled to stop steroids and to follow-up with her PCP, also provided with referral to follow-up with neurosurgery.  Patient and sister counseled to return to the ED for new worsening symptoms, patient agrees with plan.      ____________________________________________   FINAL CLINICAL IMPRESSION(S) / ED DIAGNOSES  Final diagnoses:  Confusion  Neck pain  Cervical  radiculopathy     ED Discharge Orders     None        Note:  This document was prepared using Dragon voice recognition software and may include unintentional dictation errors.    Blake Divine, MD 11/19/20 [phone]

## 2020-11-19 NOTE — ED Provider Notes (Signed)
Emergency Medicine Provider Triage Evaluation Note  Gwendolyn Hansen , a 50 y.o. female  was evaluated in triage.  Pt complains of confusion, altered mental status, generalized pain.  Patient is a very poor historian, very rambling.  Patient is complaining of pain from her head to her toes, feeling confused.  Patient states that she feels "out of it."  Denied fevers, chest pain, shortness of breath.  But states that everything hurts..  Review of Systems  Positive: Confusion, generalized pain. Negative: Fevers, chills, chest pain, shortness of breath, nausea, vomiting, diarrhea  Physical Exam  BP (!) 127/92   Pulse 92   Temp 98.1 F (36.7 C)   Resp 20   SpO2 93%  Gen:   Awake, no distress   Resp:  Normal effort  MSK:   Moves extremities without difficulty  Other:  Patient answering questions appropriately at this time.  Medical Decision Making  Medically screening exam initiated at 12:18 PM.  Appropriate orders placed.  Gwendolyn Hansen was informed that the remainder of the evaluation will be completed by another provider, this initial triage assessment does not replace that evaluation, and the importance of remaining in the ED until their evaluation is complete.  Patient complains of multiple complaints but is a very poor historian.  Does not appear that patient is having any chest pain,, shortness of breath, GI complaints, urinary changes.  She reports that she feels confused and she has pain everywhere.  This time patient will have labs, EKG, CT head, urinalysis and UDS.   Racheal Patches, PA-C 11/19/20 1224    Shaune Pollack, MD 11/20/20 [phone]

## 2020-11-20 ENCOUNTER — Encounter: Payer: 59 | Admitting: Nurse Practitioner

## 2020-11-20 ENCOUNTER — Other Ambulatory Visit: Admission: RE | Admit: 2020-11-20 | Payer: 59 | Source: Ambulatory Visit

## 2020-11-21 ENCOUNTER — Telehealth: Payer: Self-pay

## 2020-11-21 ENCOUNTER — Ambulatory Visit: Payer: 59 | Attending: Internal Medicine

## 2020-11-21 ENCOUNTER — Encounter: Payer: Self-pay | Admitting: Internal Medicine

## 2020-11-21 ENCOUNTER — Ambulatory Visit (INDEPENDENT_AMBULATORY_CARE_PROVIDER_SITE_OTHER): Payer: 59 | Admitting: Internal Medicine

## 2020-11-21 ENCOUNTER — Other Ambulatory Visit: Payer: Self-pay

## 2020-11-21 VITALS — BP 123/79 | HR 103 | Temp 98.0°F | Ht 68.9 in | Wt 198.2 lb

## 2020-11-21 DIAGNOSIS — J329 Chronic sinusitis, unspecified: Secondary | ICD-10-CM

## 2020-11-21 DIAGNOSIS — J449 Chronic obstructive pulmonary disease, unspecified: Secondary | ICD-10-CM

## 2020-11-21 DIAGNOSIS — M542 Cervicalgia: Secondary | ICD-10-CM | POA: Diagnosis not present

## 2020-11-21 DIAGNOSIS — R410 Disorientation, unspecified: Secondary | ICD-10-CM | POA: Diagnosis not present

## 2020-11-21 MED ORDER — AZITHROMYCIN 250 MG PO TABS: ORAL_TABLET | ORAL | 0 refills | Status: AC

## 2020-11-21 MED ORDER — FLUTICASONE PROPIONATE 50 MCG/ACT NA SUSP: 2.0000 | Freq: Every day | NASAL | 6 refills | Status: DC

## 2020-11-21 NOTE — Telephone Encounter (Signed)
Received message from [facility] with cardiopulmonary.  Patient no showed for covid test.  PFT will need to be rescheduled.  Lm to make patient aware.   Synetta Fail, please advise. thanks

## 2020-11-21 NOTE — Progress Notes (Signed)
BP 123/79   Pulse (!) 103   Temp 98 F (36.7 C) (Oral)   Ht 5' 8.9" (1.75 m)   Wt 198 lb 3.2 oz (89.9 kg)   SpO2 97%   BMI 29.36 kg/m    Subjective:    Patient ID: Gwendolyn Hansen, female    DOB: [birth year], 50 y.o.   MRN: [id number]  Chief Complaint  Patient presents with   Medication Reaction    Patient states that she had to go to the ER for a reaction to possibly penicillin or prednisone. Also states that she re injured her neck as well.    HPI: Gwendolyn Hansen is a 50 y.o. female  Pt has had   Anxiety Presents for follow-up (sees GAD is on) visit. Patient reports no chest pain or shortness of breath.    Neck Pain  This is a chronic problem. The current episode started more than 1 month ago. Associated symptoms include headaches. Pertinent negatives include no chest pain or fever.  Sinus Problem This is a recurrent problem. There has been no fever. Associated symptoms include congestion, coughing, headaches, neck pain and sinus pressure. Pertinent negatives include no chills, shortness of breath, sneezing, sore throat or swollen glands.   Chief Complaint  Patient presents with   Medication Reaction    Patient states that she had to go to the ER for a reaction to possibly penicillin or prednisone. Also states that she re injured her neck as well.    Relevant past medical, surgical, family and social history reviewed and updated as indicated. Interim medical history since our last visit reviewed. Allergies and medications reviewed and updated.  Review of Systems  Constitutional:  Negative for chills and fever.  HENT:  Positive for congestion and sinus pressure. Negative for sneezing and sore throat.   Respiratory:  Positive for cough. Negative for shortness of breath.   Cardiovascular:  Negative for chest pain.  Musculoskeletal:  Positive for neck pain.  Neurological:  Positive for headaches.   Per HPI unless specifically indicated above      Objective:    BP 123/79   Pulse (!) 103   Temp 98 F (36.7 C) (Oral)   Ht 5' 8.9" (1.75 m)   Wt 198 lb 3.2 oz (89.9 kg)   SpO2 97%   BMI 29.36 kg/m   Wt Readings from Last 3 Encounters:  11/21/20 198 lb 3.2 oz (89.9 kg)  11/19/20 196 lb (88.9 kg)  11/13/20 196 lb (88.9 kg)    Physical Exam Vitals and nursing note reviewed.  Constitutional:      General: She is not in acute distress.    Appearance: Normal appearance. She is not ill-appearing or diaphoretic.  Eyes:     Conjunctiva/sclera: Conjunctivae normal.  Cardiovascular:     Rate and Rhythm: Normal rate.  Pulmonary:     Breath sounds: No rhonchi.  Abdominal:     General: Abdomen is flat. Bowel sounds are normal. There is no distension.     Palpations: Abdomen is soft. There is no mass.     Tenderness: There is no abdominal tenderness. There is no guarding.  Skin:    General: Skin is warm and dry.     Coloration: Skin is not jaundiced.     Findings: No erythema.  Neurological:     Mental Status: She is alert.    Results for orders placed or performed in visit on 11/21/20  Urinalysis, Routine w reflex microscopic  Result  Value Ref Range   Specific Gravity, UA 1.015 1.005 - 1.030   pH, UA 8.0 (H) 5.0 - 7.5   Color, UA Yellow Yellow   Appearance Ur Clear Clear   Leukocytes,UA Negative Negative   Protein,UA Negative Negative/Trace   Glucose, UA Negative Negative   Ketones, UA Negative Negative   RBC, UA Negative Negative   Bilirubin, UA Negative Negative   Urobilinogen, Ur 0.2 0.2 - 1.0 mg/dL   Nitrite, UA Negative Negative  CBC With Differential/Platelet  Result Value Ref Range   WBC 11.1 (H) 3.4 - 10.8 x10E3/uL   RBC 5.00 3.77 - 5.28 x10E6/uL   Hemoglobin 15.2 11.1 - 15.9 g/dL   Hematocrit 43.1 54.0 - 46.6 %   MCV 90 79 - 97 fL   MCH 30.4 26.6 - 33.0 pg   MCHC 33.6 31.5 - 35.7 g/dL   RDW 08.6 76.1 - 95.0 %   Platelets 286 150 - 450 x10E3/uL   Neutrophils 68 Not Estab. %   Lymphs 26 Not Estab. %    MID 6 Not Estab. %   Neutrophils Absolute 7.5 (H) 1.4 - 7.0 x10E3/uL   Lymphocytes Absolute 2.9 0.7 - 3.1 x10E3/uL   MID (Absolute) 0.7 0.1 - 1.6 X10E3/uL        Current Outpatient Medications:    albuterol (PROVENTIL) (2.5 MG/3ML) 0.083% nebulizer solution, Take 3 mLs (2.5 mg total) by nebulization every 4 (four) hours as needed., Disp: 75 mL, Rfl: 12   albuterol (VENTOLIN HFA) 108 (90 Base) MCG/ACT inhaler, Inhale 2 puffs into the lungs every 6 (six) hours as needed for wheezing or shortness of breath., Disp: 8 g, Rfl: 3   amitriptyline (ELAVIL) 150 MG tablet, Take 1 tablet (150 mg total) by mouth at bedtime., Disp: 30 tablet, Rfl: 0   buprenorphine (SUBUTEX) 8 MG SUBL SL tablet, Place 4 mg under the tongue daily., Disp: , Rfl: 0   diazepam (VALIUM) 10 MG tablet, Take 10 mg by mouth 2 (two) times daily., Disp: , Rfl:    fluticasone (FLONASE) 50 MCG/ACT nasal spray, Place 2 sprays into both nostrils daily., Disp: 16 g, Rfl: 6   meclizine (ANTIVERT) 25 MG tablet, Take 1 tablet (25 mg total) by mouth 3 (three) times daily as needed for dizziness., Disp: 30 tablet, Rfl: 0   montelukast (SINGULAIR) 10 MG tablet, Take 10 mg by mouth daily., Disp: , Rfl:    nicotine (NICODERM CQ - DOSED IN MG/24 HOURS) 21 mg/24hr patch, Place 1 patch (21 mg total) onto the skin daily., Disp: 30 patch, Rfl: 0   hydrOXYzine (VISTARIL) 25 MG capsule, TAKE 1 CAPSULE(25 MG) BY MOUTH EVERY 8 HOURS AS NEEDED, Disp: 90 capsule, Rfl: 0   Tiotropium Bromide-Olodaterol (STIOLTO RESPIMAT) 2.5-2.5 MCG/ACT AERS, Inhale 2 puffs into the lungs daily., Disp: 1 each, Rfl: 11    Assessment & Plan:  Neck pain :  Chronic, worsening as above. Will refer to ortho. [facility] surg to such in 2011.   AMS: altered mental status :  will need to refer to neurology for evaluation and rx.    URI / acute sinusiti s: will start pt on zpak WBC count still high, but improving.recurrent, pt would like to see ENT   pt advised to take Tylenol q 4- 6  hourly as needed. pt to take allegra q pm as needed and to call office if symptoms worsened pt verbalised understanding of such.     Problem List Items Addressed This Visit  Other   Neck pain - Primary   Relevant Orders   Ambulatory referral to Orthopedics   Other Visit Diagnoses     Disorientation       Relevant Orders   Urinalysis, Routine w reflex microscopic (Completed)   CBC With Differential/Platelet (Completed)   Ambulatory referral to Neurology   Recurrent sinus infections       Relevant Medications   fluticasone (FLONASE) 50 MCG/ACT nasal spray   Other Relevant Orders   Ambulatory referral to ENT        Orders Placed This Encounter  Procedures   Urinalysis, Routine w reflex microscopic   CBC With Differential/Platelet   Ambulatory referral to Orthopedics   Ambulatory referral to Neurology   Ambulatory referral to ENT     Meds ordered this encounter  Medications   azithromycin (ZITHROMAX) 250 MG tablet    Sig: Take 2 tablets on day 1, then 1 tablet daily on days 2 through 5    Dispense:  6 tablet    Refill:  0   fluticasone (FLONASE) 50 MCG/ACT nasal spray    Sig: Place 2 sprays into both nostrils daily.    Dispense:  16 g    Refill:  6     Follow up plan: Return in about 3 weeks (around 12/12/2020).

## 2020-11-22 ENCOUNTER — Ambulatory Visit: Payer: 59 | Admitting: Licensed Clinical Social Worker

## 2020-11-22 ENCOUNTER — Telehealth: Payer: Self-pay | Admitting: Internal Medicine

## 2020-11-22 LAB — URINALYSIS, ROUTINE W REFLEX MICROSCOPIC
Bilirubin, UA: NEGATIVE
Glucose, UA: NEGATIVE
Ketones, UA: NEGATIVE
Leukocytes,UA: NEGATIVE
Nitrite, UA: NEGATIVE
Protein,UA: NEGATIVE
RBC, UA: NEGATIVE
Specific Gravity, UA: 1.015 (ref 1.005–1.030)
Urobilinogen, Ur: 0.2 mg/dL (ref 0.2–1.0)
pH, UA: 8 — ABNORMAL HIGH (ref 5.0–7.5)

## 2020-11-22 LAB — CBC WITH DIFFERENTIAL/PLATELET
Hematocrit: 45.2 % (ref 34.0–46.6)
Hemoglobin: 15.2 g/dL (ref 11.1–15.9)
Lymphocytes Absolute: 2.9 10*3/uL (ref 0.7–3.1)
Lymphs: 26 %
MCH: 30.4 pg (ref 26.6–33.0)
MCHC: 33.6 g/dL (ref 31.5–35.7)
MCV: 90 fL (ref 79–97)
MID (Absolute): 0.7 10*3/uL (ref 0.1–1.6)
MID: 6 %
Neutrophils Absolute: 7.5 10*3/uL — ABNORMAL HIGH (ref 1.4–7.0)
Neutrophils: 68 %
Platelets: 286 10*3/uL (ref 150–450)
RBC: 5 x10E6/uL (ref 3.77–5.28)
RDW: 14.3 % (ref 11.7–15.4)
WBC: 11.1 10*3/uL — ABNORMAL HIGH (ref 3.4–10.8)

## 2020-11-22 NOTE — Telephone Encounter (Signed)
noted 

## 2020-11-23 ENCOUNTER — Other Ambulatory Visit: Payer: Self-pay

## 2020-11-23 DIAGNOSIS — J449 Chronic obstructive pulmonary disease, unspecified: Secondary | ICD-10-CM

## 2020-11-23 NOTE — Telephone Encounter (Signed)
Rhonda schedule the Covid Test on 11/28/20 @ 9:00am and PFT scheduled on 11/29/20 @ 1:00pm she mailed the instructions to the patient

## 2020-11-23 NOTE — Telephone Encounter (Signed)
Rhonda schedule the Covid Test on 11/28/20 @ 9:00am and PFT scheduled on 11/29/20 @ 1:00pm she mailed the instructions to the patient 

## 2020-11-26 ENCOUNTER — Telehealth: Payer: Self-pay

## 2020-11-26 NOTE — Chronic Care Management (AMB) (Signed)
Care Management Clinical Social Work Note  11/26/2020 Name: Gwendolyn Hansen MRN: [id number] DOB: 04/27/1970  [name] is a 50 y.o. year old female who is a primary care patient of [name], [name], MD.  The Care Management team was consulted for assistance with chronic disease management and coordination needs.  Engaged with patient by telephone for follow up visit in response to provider referral for social work chronic care management and care coordination services  Consent to Services:  Ms. Brisbane was given information about Care Management services today including:  Care Management services includes personalized support from designated clinical staff supervised by her physician, including individualized plan of care and coordination with other care providers 24/7 contact phone numbers for assistance for urgent and routine care needs. The patient may stop case management services at any time by phone call to the office staff.  Patient agreed to services and consent obtained.   Consent to Services:  The patient was given information about Care Management services, agreed to services, and gave verbal consent prior to initiation of services.  Please see initial visit note for detailed documentation.   Patient agreed to services today and consent obtained.  Engaged with patient by phone in response to provider referral for social work care coordination services:  Assessment/Interventions:  Patient continues to maintain positive progress with care plan goals. She reports difficulty managing pain from a previous injury, resulting in increase of anxiety due to inability to sleep. Strategies to assist with coping identified and supportive resources discussed  See Care Plan below for interventions and patient self-care activities.  Recent life changes or stressors: Grief and pain management  Recommendation: Patient may benefit from, and is in agreement work with LCSW to address care  coordination needs and will continue to work with the clinical team to address health care and disease management related needs.   Follow up Plan: Patient would like continued follow-up from CCM LCSW .  per patient's request will follow up in 01/04/21.  Will call office if needed prior to next encounter.  SDOH (Social Determinants of Health) assessments and interventions performed:    Advanced Directives Status: Not addressed in this encounter.  Care Plan  Allergies  Allergen Reactions   Naloxone Swelling    Outpatient Encounter Medications as of 11/22/2020  Medication Sig   albuterol (PROVENTIL) (2.5 MG/3ML) 0.083% nebulizer solution Take 3 mLs (2.5 mg total) by nebulization every 4 (four) hours as needed.   albuterol (VENTOLIN HFA) 108 (90 Base) MCG/ACT inhaler Inhale 2 puffs into the lungs every 6 (six) hours as needed for wheezing or shortness of breath.   amitriptyline (ELAVIL) 150 MG tablet Take 1 tablet (150 mg total) by mouth at bedtime.   azithromycin (ZITHROMAX) 250 MG tablet Take 2 tablets on day 1, then 1 tablet daily on days 2 through 5   buprenorphine (SUBUTEX) 8 MG SUBL SL tablet Place 4 mg under the tongue daily.   diazepam (VALIUM) 10 MG tablet Take 10 mg by mouth 2 (two) times daily.   fluticasone (FLONASE) 50 MCG/ACT nasal spray Place 2 sprays into both nostrils daily.   hydrOXYzine (VISTARIL) 25 MG capsule Take 1 capsule (25 mg total) by mouth every 8 (eight) hours as needed. needs appointment for further refills   meclizine (ANTIVERT) 25 MG tablet Take 1 tablet (25 mg total) by mouth 3 (three) times daily as needed for dizziness.   montelukast (SINGULAIR) 10 MG tablet Take 10 mg by mouth daily.   nicotine (  NICODERM CQ - DOSED IN MG/24 HOURS) 21 mg/24hr patch Place 1 patch (21 mg total) onto the skin daily.   Tiotropium Bromide-Olodaterol (STIOLTO RESPIMAT) 2.5-2.5 MCG/ACT AERS Inhale 2 puffs into the lungs daily.   No facility-administered encounter medications on  file as of 11/22/2020.    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   Tobacco use disorder 08/18/2014   Neck pain 08/17/2014   Ankle pain 08/17/2014   GAD (generalized anxiety disorder) 08/17/2014   Depression 08/17/2014   Opioid use disorder, severe, in controlled environment, dependence ([place]) 08/17/2014   History of alcohol dependence ([place]) 08/17/2014    Conditions to be addressed/monitored: Anxiety and Depression;  Grief  Care Plan : LCSW Care Plan  Updates made by Rebekah Chesterfield, LCSW since 11/26/2020 12:00 AM     Problem: Coping Skills (General Plan of Care)      Goal: Coping Skills Enhanced   Start Date: 10/18/2020  This Visit's Progress: On track  Recent Progress: On track  Priority: Medium  Note:   Current barriers:   Severe Persistent Mental Health needs related to Depression and Anxiety Mental Health Concerns  and Grief Needs Support, Education, and Care Coordination in order to meet unmet mental health needs. Clinical Goal(s): demonstrate a reduction in symptoms related to :Anxiety , Depression, Grief, and increase self-management skills     Clinical Interventions:  Assessed patient's previous and current treatment, coping skills, support system and barriers to care  Patient reports difficulty managing social anxiety "all my life" She has hx of medication management and therapy, sharing that speaking on trauma was ineffective for managing symptoms.  Patient is currently grieving the loss of mother, noting "she was my everything" Denies SI/HI 11/03: Patient reports difficulty coping with grief of mother and brother in law within one year Patient reports difficulty managing pain (from a previous neck injury in 2010 that radiates down to ankle) resulting in decreased sleep. Patient endorses feeling "loopy", irritated, and anxious. She has a scheduled appt with Ortho for 12/25/20 CCM LCSW provided validation and support. Psychoeducation regarding stages of grief was discussed and supportive resources were provided Patient has a strong support system consisting of fiance' and sister. Patient continues to spend time with mother's best friend although social anxiety symptoms can be difficult to manage Patient was successful in identifying coping skills 11/3: Patient picked up two medications today. She is also using OTC meds to assist with heartburn. CCM LCSW discussed how anxiety symptoms can show through mentally and physically. Strategies to assist with anxiety management discussed Patient reports that hydroxyzine has not assisted in managing episodes of high anxiety. Patient was contacted by [facility] and informed that they did not feel she was a strong candidate for services. Encouraged her to initiate services through [facility]. Patient shared that she received services from [facility] in the past; however, her social anxiety symptoms are too severe to complete walk-in intake and/or attend in-person services. Patient reports that she stays in the house as much as she  can. CCM LCSW provided patient with additional local agencies that provide medication management, including [employer] 11/03: Patient reports that she is interested in initiating therapy to strengthen support system CCM LCSW reviewed upcoming appointments and encouraged utilization of MyChart app to communicate with medical team. Patient plans to re-schedule with [facility] Depression screen  reviewed  Solution-Focused Strategies Active listening / Reflection utilized  Emotional Supportive Provided Provided psychoeducation for mental health needs  Quality of sleep assessed & Sleep Hygiene techniques promoted  Participation in counseling encouraged  Verbalization of feelings encouraged  Crisis Resource Education / information provided  Suicidal Ideation/Homicidal Ideation assessed: ; Review various resources, discussed options and provided patient information about  Options for mental health treatment based on need and insurance 1:1 collaboration with primary care provider regarding development and update of comprehensive plan of care as evidenced by provider attestation and co-signature Inter-disciplinary care team collaboration (see longitudinal plan of care) Patient Goals/Self-Care Activities: Over the next 120 days Continue with compliance of taking medication  Attend scheduled appointments with providers Contact clinic with any questions/concerns Utilize healthy coping skills discussed Follow up with [facility] and/or [facility] for med management      Christa See, MSW, [email or web address] Phone [phone] 7:31 AM

## 2020-11-26 NOTE — Telephone Encounter (Signed)
Called and spoke to patient about upcoming COVID test, patient had a clear understanding nothing further needed.  

## 2020-11-26 NOTE — Patient Instructions (Signed)
Visit Information  Patient verbalizes understanding of instructions provided today and agrees to view in MyChart.   Telephone follow up appointment with care management team member scheduled for:01/04/21  Jenel Lucks, MSW, LCSW [facility] Care Management [facility]  [facility] [email or web address] Phone [phone] 7:34 AM

## 2020-11-28 ENCOUNTER — Other Ambulatory Visit: Payer: Self-pay

## 2020-11-28 ENCOUNTER — Other Ambulatory Visit
Admission: RE | Admit: 2020-11-28 | Discharge: 2020-11-28 | Disposition: A | Discharge status: Home / Self Care | Payer: 59 | Source: Ambulatory Visit | Attending: Internal Medicine | Admitting: Internal Medicine

## 2020-11-28 DIAGNOSIS — Z20822 Contact with and (suspected) exposure to covid-19: Secondary | ICD-10-CM | POA: Insufficient documentation

## 2020-11-28 DIAGNOSIS — Z01812 Encounter for preprocedural laboratory examination: Secondary | ICD-10-CM | POA: Diagnosis present

## 2020-11-29 ENCOUNTER — Ambulatory Visit: Payer: 59 | Attending: Internal Medicine

## 2020-11-29 ENCOUNTER — Ambulatory Visit: Payer: 59

## 2020-11-29 DIAGNOSIS — J449 Chronic obstructive pulmonary disease, unspecified: Secondary | ICD-10-CM | POA: Diagnosis present

## 2020-11-29 LAB — PULMONARY FUNCTION TEST ARMC ONLY
DL/VA % pred: 113 %
DL/VA: 4.68 ml/min/mmHg/L
DLCO unc % pred: 84 %
DLCO unc: 20.82 ml/min/mmHg
FEF 25-75 Post: 1.18 L/sec
FEF 25-75 Pre: 0.97 L/sec
FEF2575-%Change-Post: 21 %
FEF2575-%Pred-Post: 39 %
FEF2575-%Pred-Pre: 32 %
FEV1-%Change-Post: 6 %
FEV1-%Pred-Post: 60 %
FEV1-%Pred-Pre: 56 %
FEV1-Post: 1.95 L
FEV1-Pre: 1.83 L
FEV1FVC-%Change-Post: -2 %
FEV1FVC-%Pred-Pre: 71 %
FEV6-%Change-Post: 9 %
FEV6-%Pred-Post: 84 %
FEV6-%Pred-Pre: 77 %
FEV6-Post: 3.39 L
FEV6-Pre: 3.1 L
FEV6FVC-%Change-Post: 0 %
FEV6FVC-%Pred-Post: 99 %
FEV6FVC-%Pred-Pre: 99 %
FVC-%Change-Post: 9 %
FVC-%Pred-Post: 84 %
FVC-%Pred-Pre: 77 %
FVC-Post: 3.51 L
FVC-Pre: 3.19 L
Post FEV1/FVC ratio: 56 %
Post FEV6/FVC ratio: 97 %
Pre FEV1/FVC ratio: 57 %
Pre FEV6/FVC Ratio: 97 %
RV % pred: 135 %
RV: 2.81 L
TLC % pred: 100 %
TLC: 5.86 L

## 2020-11-29 LAB — SARS CORONAVIRUS 2 (TAT 6-24 HRS): SARS Coronavirus 2: NEGATIVE

## 2020-11-29 MED ORDER — ALBUTEROL SULFATE (2.5 MG/3ML) 0.083% IN NEBU
2.5000 mg | INHALATION_SOLUTION | Freq: Once | RESPIRATORY_TRACT | Status: AC
  Administered 2020-11-29: 2.5 mg via RESPIRATORY_TRACT
  Filled 2020-11-29: qty 3

## 2020-12-01 ENCOUNTER — Other Ambulatory Visit: Payer: Self-pay | Admitting: Internal Medicine

## 2020-12-01 NOTE — Telephone Encounter (Signed)
Pt scheduled for appt on 12/10/20  Requested Prescriptions  Pending Prescriptions Disp Refills  . hydrOXYzine (VISTARIL) 25 MG capsule [Pharmacy Med Name: HYDROXYZINE PAMOATE 25MG  CAPSULES] 90 capsule 0    Sig: TAKE 1 CAPSULE(25 MG) BY MOUTH EVERY 8 HOURS AS NEEDED     Ear, Nose, and Throat:  Antihistamines Passed - 12/01/2020 11:05 AM      Passed - Valid encounter within last 12 months    Recent Outpatient Visits          1 week ago Neck pain   [facility] [name], [name], MD   2 weeks ago Acute cystitis without hematuria   [facility], NP   1 month ago Sinusitis, unspecified chronicity, unspecified location   [facility] [name], [name], MD   1 month ago Screening for colon cancer   [facility] [name], [name], MD   6 years ago Annual physical exam   [facility] [place], [place], Alessandra Bevels      Future Appointments            In 1 week [name], [name], MD [facility]   In 3 weeks [facility], Sherene Sires, MD [facility] [place]

## 2020-12-04 ENCOUNTER — Ambulatory Visit: Admission: RE | Admit: 2020-12-04 | Payer: 59 | Source: Ambulatory Visit

## 2020-12-04 ENCOUNTER — Encounter: Payer: Self-pay | Admitting: Internal Medicine

## 2020-12-05 NOTE — Progress Notes (Signed)
LMTCB

## 2020-12-06 ENCOUNTER — Other Ambulatory Visit (HOSPITAL_COMMUNITY): Payer: Self-pay | Admitting: Internal Medicine

## 2020-12-06 DIAGNOSIS — Z1231 Encounter for screening mammogram for malignant neoplasm of breast: Secondary | ICD-10-CM

## 2020-12-09 ENCOUNTER — Other Ambulatory Visit: Payer: Self-pay | Admitting: Nurse Practitioner

## 2020-12-09 NOTE — Telephone Encounter (Signed)
Pt needs to follow up with PCP first - has appt 12/10/20

## 2020-12-10 ENCOUNTER — Ambulatory Visit (HOSPITAL_COMMUNITY): Admission: RE | Admit: 2020-12-10 | Payer: 59 | Source: Ambulatory Visit

## 2020-12-10 ENCOUNTER — Encounter (HOSPITAL_COMMUNITY): Payer: Self-pay

## 2020-12-10 ENCOUNTER — Ambulatory Visit (INDEPENDENT_AMBULATORY_CARE_PROVIDER_SITE_OTHER): Payer: 59 | Admitting: Internal Medicine

## 2020-12-10 ENCOUNTER — Other Ambulatory Visit: Payer: Self-pay

## 2020-12-10 ENCOUNTER — Encounter: Payer: Self-pay | Admitting: Internal Medicine

## 2020-12-10 VITALS — BP 135/86 | HR 108 | Temp 98.5°F | Ht 68.9 in | Wt 197.8 lb

## 2020-12-10 DIAGNOSIS — J329 Chronic sinusitis, unspecified: Secondary | ICD-10-CM

## 2020-12-10 DIAGNOSIS — R410 Disorientation, unspecified: Secondary | ICD-10-CM | POA: Diagnosis not present

## 2020-12-10 DIAGNOSIS — Z1231 Encounter for screening mammogram for malignant neoplasm of breast: Secondary | ICD-10-CM

## 2020-12-10 NOTE — Progress Notes (Signed)
Ht 5' 8.9" (1.75 m)   Wt 197 lb 12.8 oz (89.7 kg)   BMI 29.30 kg/m    Subjective:    Patient ID: Gwendolyn Hansen, female    DOB: [birth year], 50 y.o.   MRN: [id number]  Chief Complaint  Patient presents with   Headache    Patient states that she is currently having a headache, head congestion, and ear pain for the past 3 days.    disorientation   Anxiety    Patient states that her valium does not work for her anxiety     HPI: Gwendolyn Hansen is a 50 y.o. female  Pt hasnt seen neurology for disorientation. She flushed all her buprenorphine ( subutex )  wants to come off of such. Going through withdrawal per pt to d/w the physican who refilled this. She says she feels better since last time. She was more agitated, was given hydroxyzine last   Headache  This is a chronic problem.  Anxiety Presents for follow-up (GAD is on hydroxyzine.) visit. Patient reports no chest pain.    Nicotine Dependence Presents for follow-up (still smoking about 1 ppd , dropped) visit. Her urge triggers include company of smokers.   Chief Complaint  Patient presents with   Headache    Patient states that she is currently having a headache, head congestion, and ear pain for the past 3 days.    disorientation   Anxiety    Patient states that her valium does not work for her anxiety     Relevant past medical, surgical, family and social history reviewed and updated as indicated. Interim medical history since our last visit reviewed. Allergies and medications reviewed and updated.  Review of Systems  Cardiovascular:  Negative for chest pain.  Neurological:  Positive for headaches.   Per HPI unless specifically indicated above     Objective:    Ht 5' 8.9" (1.75 m)   Wt 197 lb 12.8 oz (89.7 kg)   BMI 29.30 kg/m   Wt Readings from Last 3 Encounters:  12/10/20 197 lb 12.8 oz (89.7 kg)  11/21/20 198 lb 3.2 oz (89.9 kg)  11/19/20 196 lb (88.9 kg)    Physical Exam Vitals and  nursing note reviewed.  Constitutional:      General: She is not in acute distress.    Appearance: Normal appearance. She is not ill-appearing or diaphoretic.  Eyes:     Conjunctiva/sclera: Conjunctivae normal.  Pulmonary:     Breath sounds: No rhonchi.  Abdominal:     General: Abdomen is flat.     Palpations: There is no mass.  Skin:    Coloration: Skin is not jaundiced.  Neurological:     Mental Status: She is alert.    Results for orders placed or performed in visit on 11/29/20  Pulmonary Function Test [facility] Only  Result Value Ref Range   FVC-Pre 3.19 L   FVC-%Pred-Pre 77 %   FVC-Post 3.51 L   FVC-%Pred-Post 84 %   FVC-%Change-Post 9 %   FEV1-Pre 1.83 L   FEV1-%Pred-Pre 56 %   FEV1-Post 1.95 L   FEV1-%Pred-Post 60 %   FEV1-%Change-Post 6 %   FEV6-Pre 3.10 L   FEV6-%Pred-Pre 77 %   FEV6-Post 3.39 L   FEV6-%Pred-Post 84 %   FEV6-%Change-Post 9 %   Pre FEV1/FVC ratio 57 %   FEV1FVC-%Pred-Pre 71 %   Post FEV1/FVC ratio 56 %   FEV1FVC-%Change-Post -2 %   Pre FEV6/FVC Ratio 97 %  FEV6FVC-%Pred-Pre 99 %   Post FEV6/FVC ratio 97 %   FEV6FVC-%Pred-Post 99 %   FEV6FVC-%Change-Post 0 %   FEF 25-75 Pre 0.97 L/sec   FEF2575-%Pred-Pre 32 %   FEF 25-75 Post 1.18 L/sec   FEF2575-%Pred-Post 39 %   FEF2575-%Change-Post 21 %   RV 2.81 L   RV % pred 135 %   TLC 5.86 L   TLC % pred 100 %   DLCO unc 20.82 ml/min/mmHg   DLCO unc % pred 84 %   DL/VA 1.15 ml/min/mmHg/L   DL/VA % pred 520 %        Current Outpatient Medications:    albuterol (PROVENTIL) (2.5 MG/3ML) 0.083% nebulizer solution, Take 3 mLs (2.5 mg total) by nebulization every 4 (four) hours as needed., Disp: 75 mL, Rfl: 12   albuterol (VENTOLIN HFA) 108 (90 Base) MCG/ACT inhaler, Inhale 2 puffs into the lungs every 6 (six) hours as needed for wheezing or shortness of breath., Disp: 8 g, Rfl: 3   amitriptyline (ELAVIL) 150 MG tablet, Take 1 tablet (150 mg total) by mouth at bedtime., Disp: 30 tablet, Rfl: 0    buprenorphine (SUBUTEX) 8 MG SUBL SL tablet, Place 4 mg under the tongue daily., Disp: , Rfl: 0   diazepam (VALIUM) 10 MG tablet, Take 10 mg by mouth 2 (two) times daily., Disp: , Rfl:    fluticasone (FLONASE) 50 MCG/ACT nasal spray, Place 2 sprays into both nostrils daily., Disp: 16 g, Rfl: 6   hydrOXYzine (VISTARIL) 25 MG capsule, TAKE 1 CAPSULE(25 MG) BY MOUTH EVERY 8 HOURS AS NEEDED, Disp: 90 capsule, Rfl: 0   meclizine (ANTIVERT) 25 MG tablet, Take 1 tablet (25 mg total) by mouth 3 (three) times daily as needed for dizziness., Disp: 30 tablet, Rfl: 0   montelukast (SINGULAIR) 10 MG tablet, Take 10 mg by mouth daily., Disp: , Rfl:    nicotine (NICODERM CQ - DOSED IN MG/24 HOURS) 21 mg/24hr patch, Place 1 patch (21 mg total) onto the skin daily., Disp: 30 patch, Rfl: 0   Tiotropium Bromide-Olodaterol (STIOLTO RESPIMAT) 2.5-2.5 MCG/ACT AERS, Inhale 2 puffs into the lungs daily., Disp: 1 each, Rfl: 11    Assessment & Plan:  Disorientation / withdrawals  Will need to fu with neurology asap.  Didn't make appointments.   Recurrent Sinus infections  Will need to fu with ENT.   Neck pain : to see ortho for such  Was referred. Didn't scheudle.   Pt will need to keep all of her specialist appointments and then follow back with [place].  She verbalized she can come in for any new / acute problems.   Problem List Items Addressed This Visit   None    No orders of the defined types were placed in this encounter.    No orders of the defined types were placed in this encounter.    Follow up plan: No follow-ups on file.  Health Maintenance : Mammogram/ Paps smear: to schedule Cscope :to scheudule.

## 2020-12-11 ENCOUNTER — Ambulatory Visit: Payer: 59 | Admitting: Internal Medicine

## 2020-12-14 ENCOUNTER — Other Ambulatory Visit: Payer: Self-pay | Admitting: Internal Medicine

## 2020-12-14 ENCOUNTER — Other Ambulatory Visit: Payer: Self-pay | Admitting: Nurse Practitioner

## 2020-12-16 NOTE — Telephone Encounter (Signed)
Requested Prescriptions  Pending Prescriptions Disp Refills  . amitriptyline (ELAVIL) 150 MG tablet [Pharmacy Med Name: AMITRIPTYLINE 150MG  TABLETS] 90 tablet 0    Sig: TAKE 1 TABLET(150 MG) BY MOUTH AT BEDTIME     Psychiatry:  Antidepressants - Heterocyclics (TCAs) Passed - 12/14/2020  4:53 PM      Passed - Completed PHQ-2 or PHQ-9 in the last 360 days      Passed - Valid encounter within last 6 months    Recent Outpatient Visits          6 days ago Recurrent sinus infections   [facility] [name], [name], MD   3 weeks ago Neck pain   [facility] [name], [name], MD   1 month ago Acute cystitis without hematuria   [facility] [place], [place], NP   1 month ago Sinusitis, unspecified chronicity, unspecified location   [facility] [name], [name], MD   2 months ago Screening for colon cancer   [facility] [name], [name], MD      Future Appointments            In 1 week Wert, [facility], MD [facility] [place]   In 2 months [name], [name], MD [facility], [facility]

## 2020-12-24 ENCOUNTER — Ambulatory Visit: Payer: 59 | Admitting: Internal Medicine

## 2020-12-24 NOTE — Progress Notes (Deleted)
Gwendolyn Hansen, female    DOB: 04-28-1970,   MRN: [id number]   Brief patient profile:  5 yowf active smoker with h/o sinus infections as child /grew up in house with smokers and sinus problems never improved then  in her 93s with freq "bronchitis" never had ent eval rx prn saba then started on spiriva but  not taking it regularly and referred to pulmonary clinic in [facility]  11/13/2020 by Dr Neomia Dear   for cough/doe          History of Present Illness  11/13/2020  Pulmonary/ 1st office eval/ Gwendolyn Hansen / [employer]  on prednisone starting am of ov - was on augmentin 8/4 did not help Chief Complaint  Patient presents with   Consult    Congestion, smoker, wheezing, sob.  Dyspnea:  can still do shopping  Cough: yellow  Sleep: bed is flat/ 2 pillows  SABA [id number] in am hfa/ has neb not using  Rec Plan A = Automatic = Always=  Stiolto 2 puff each am Work on inhaler technique:  Plan B = Backup (to supplement plan A, not to replace it) Only use your albuterol inhaler as a rescue medication Plan C = Crisis (instead of Plan B but only if Plan B stops working) - only use your albuterol nebulizer if you first try Plan B  Best cough mucinex dm 1200 mg every 12 hours as needed  Pantoprazole (protonix) 40 mg   Take  30-60 min before first meal of the day and Pepcid (famotidine)  20 mg after supper until return to office - We will call to schedule a sinus ct scan and refer you to ENT if needed  We will schedule your for PFTs      Please schedule a follow up office visit in 4 weeks, sooner if needed - must bring all medications and inhalers    12/24/2020  f/u ov/Gwendolyn Hansen/ [facility] re: ***   maint on ***  No chief complaint on file.   Dyspnea:  *** Cough: *** Sleeping: *** SABA use: *** 02: *** Covid status:   ***   No obvious day to day or daytime variability or assoc excess/ purulent sputum or mucus plugs or hemoptysis or cp or chest tightness, subjective wheeze or overt  sinus or hb symptoms.   *** without nocturnal  or early am exacerbation  of respiratory  c/o's or need for noct saba. Also denies any obvious fluctuation of symptoms with weather or environmental changes or other aggravating or alleviating factors except as outlined above   No unusual exposure hx or h/o childhood pna/ asthma or knowledge of premature birth.  Current Allergies, Complete Past Medical History, Past Surgical History, Family History, and Social History were reviewed in [employer] record.  ROS  The following are not active complaints unless bolded Hoarseness, sore throat, dysphagia, dental problems, itching, sneezing,  nasal congestion or discharge of excess mucus or purulent secretions, ear ache,   fever, chills, sweats, unintended wt loss or wt gain, classically pleuritic or exertional cp,  orthopnea pnd or arm/hand swelling  or leg swelling, presyncope, palpitations, abdominal pain, anorexia, nausea, vomiting, diarrhea  or change in bowel habits or change in bladder habits, change in stools or change in urine, dysuria, hematuria,  rash, arthralgias, visual complaints, headache, numbness, weakness or ataxia or problems with walking or coordination,  change in mood or  memory.        No outpatient medications have been marked  as taking for the 12/24/20 encounter (Appointment) with Nyoka Cowden, MD.          Past Medical History:  Diagnosis Date   Allergy    Anxiety    Asthma        Objective:    Wt Readings from Last 3 Encounters:  12/10/20 197 lb 12.8 oz (89.7 kg)  11/21/20 198 lb 3.2 oz (89.9 kg)  11/19/20 196 lb (88.9 kg)      Vital signs reviewed  12/24/2020  - Note at rest 02 sats  ***% on ***   General appearance:    ***       coarse Insp/exp rhonchi with upper airway pattern***        Assessment

## 2020-12-25 ENCOUNTER — Ambulatory Visit: Payer: 59 | Admitting: Orthopaedic Surgery

## 2020-12-31 ENCOUNTER — Telehealth: Payer: 59

## 2021-01-01 ENCOUNTER — Telehealth: Payer: Self-pay | Admitting: Licensed Clinical Social Worker

## 2021-01-01 NOTE — Telephone Encounter (Signed)
° °   Clinical Social Work  Care Management   Phone Outreach    01/01/2021 Name: Yessika Otte MRN: [id number] DOB: Jan 16, 1971  [name] Clois Montavon is a 50 y.o. year old female who is a primary care patient of [name], [name], MD .   Reason for referral: Mental Health Counseling and Resources and Grief Counseling.    F/U phone call today to assess needs, progress and barriers with care plan goals.   Telephone outreach was unsuccessful. A HIPPA compliant phone message was left for the patient providing contact information and requesting a return call.   Plan:CCM LCSW will wait for return call. If no return call is received, LCSW will make another attempt within 30 days  Review of patient status, including review of consultants reports, relevant laboratory and other test results, and collaboration with appropriate care team members and the patient's provider was performed as part of comprehensive patient evaluation and provision of care management services.    Jenel Lucks, MSW, LCSW [facility] Care Management [place]   [facility] [email or web address] Phone [phone] 9:53 AM

## 2021-01-08 ENCOUNTER — Other Ambulatory Visit: Payer: Self-pay | Admitting: Internal Medicine

## 2021-01-08 NOTE — Telephone Encounter (Signed)
Requested Prescriptions  Pending Prescriptions Disp Refills   hydrOXYzine (VISTARIL) 25 MG capsule [Pharmacy Med Name: HYDROXYZINE PAMOATE 25MG  CAPSULES] 90 capsule 0    Sig: TAKE 1 CAPSULE(25 MG) BY MOUTH EVERY 8 HOURS AS NEEDED     Ear, Nose, and Throat:  Antihistamines Passed - 01/08/2021  3:19 AM      Passed - Valid encounter within last 12 months    Recent Outpatient Visits          4 weeks ago Recurrent sinus infections   [facility] [name], [name], MD   1 month ago Neck pain   [facility] [name], [name], MD   1 month ago Acute cystitis without hematuria   [facility] [place], [place], NP   2 months ago Sinusitis, unspecified chronicity, unspecified location   [facility] [name], [name], MD   3 months ago Screening for colon cancer   [facility] [name], [name], MD      Future Appointments            In 2 months [name], [name], MD [facility], [facility]

## 2021-01-18 ENCOUNTER — Ambulatory Visit: Payer: 59 | Admitting: Licensed Clinical Social Worker

## 2021-01-18 NOTE — Chronic Care Management (AMB) (Signed)
° °   Clinical Social Work  Care Management   Phone Outreach    01/18/2021 Name: Vicci Reder MRN: [id number] DOB: 04-Jun-1970  [name] Kyliegh Jester is a 50 y.o. year old female who is a primary care patient of [name], [name], MD .   Reason for referral: Mental Health Counseling and Resources.    F/U phone call today to assess needs, progress and barriers with care plan goals.   Patient was unable to keep phone appointment today and requested to reschedule.  Plan:Appointment was rescheduled with CCM LCSW on 03/04/21  Review of patient status, including review of consultants reports, relevant laboratory and other test results, and collaboration with appropriate care team members and the patient's provider was performed as part of comprehensive patient evaluation and provision of care management services.    Jenel Lucks, MSW, LCSW [facility] [place]   [facility] [email or web address] Phone [phone] 11:24 AM

## 2021-02-06 ENCOUNTER — Telehealth: Payer: Self-pay | Admitting: Internal Medicine

## 2021-02-06 DIAGNOSIS — J449 Chronic obstructive pulmonary disease, unspecified: Secondary | ICD-10-CM

## 2021-02-06 MED ORDER — STIOLTO RESPIMAT 2.5-2.5 MCG/ACT IN AERS: 2.0000 | INHALATION_SPRAY | Freq: Every day | RESPIRATORY_TRACT | 11 refills | Status: DC

## 2021-02-06 NOTE — Telephone Encounter (Signed)
Called and spoke with patient that we did send in the refills for her Stiolto Inhaler for her with the pharmacy we have on file and patient verified pharmacy. No questions or concerns from patient. Nothing further needed at this time

## 2021-02-26 ENCOUNTER — Other Ambulatory Visit: Payer: Self-pay | Admitting: Neurology

## 2021-02-26 ENCOUNTER — Other Ambulatory Visit (HOSPITAL_COMMUNITY): Payer: Self-pay | Admitting: Neurology

## 2021-02-26 DIAGNOSIS — M542 Cervicalgia: Secondary | ICD-10-CM

## 2021-02-27 ENCOUNTER — Other Ambulatory Visit: Payer: Self-pay

## 2021-02-27 ENCOUNTER — Ambulatory Visit: Payer: Self-pay

## 2021-02-27 ENCOUNTER — Ambulatory Visit (INDEPENDENT_AMBULATORY_CARE_PROVIDER_SITE_OTHER): Payer: 59 | Admitting: Orthopaedic Surgery

## 2021-02-27 ENCOUNTER — Encounter: Payer: Self-pay | Admitting: Orthopaedic Surgery

## 2021-02-27 VITALS — BP 125/85 | HR 97 | Ht 70.0 in | Wt 181.7 lb

## 2021-02-27 DIAGNOSIS — R200 Anesthesia of skin: Secondary | ICD-10-CM | POA: Insufficient documentation

## 2021-02-27 DIAGNOSIS — M542 Cervicalgia: Secondary | ICD-10-CM | POA: Diagnosis not present

## 2021-02-27 DIAGNOSIS — M25571 Pain in right ankle and joints of right foot: Secondary | ICD-10-CM

## 2021-02-27 NOTE — Progress Notes (Signed)
Office Visit Note   Patient: Gwendolyn Hansen           Date of Birth: Jan 19, 1971           MRN: [id number] Visit Date: 02/27/2021              Requested by: Charlynne Cousins, MD [address] [place],  [place] [address] PCP: Charlynne Cousins, MD   Assessment & Plan: Visit Diagnoses:  1. Neck pain   2. Pain in right ankle and joints of right foot   3. Bilateral hand numbness     Plan: Patient's already had the MRI scan ordered as well as nerve conduction velocities.  She can follow-up with me after the studies are completed.  She states her sister just had three-level cervical fusion and also carpal tunnel surgery done yesterday.  Recheck 6 weeks.  Follow-Up Instructions: Return in about 6 weeks (around 04/10/2021).   Orders:  Orders Placed This Encounter  Procedures   XR Cervical Spine 2 or 3 views   XR Ankle Complete Right   No orders of the defined types were placed in this encounter.     Procedures: No procedures performed   Clinical Data: No additional findings.   Subjective: Chief Complaint  Patient presents with   Neck - Pain   Right Ankle - Pain    HPI 51 year old female recently seen by neurology for neck pain and hand numbness and tingling.  She states she cannot lay on her right side at night.  She had a 2009 C5-6 ACDF done in [place] which is solidly fused.  Recent CT scan of her head and neck obtained when patient was presenting with altered mental status and confusion.  History of alcohol and opioid disorder.  She states she is no longer on buprenorphine.  She is taking gabapentin also Valium 10 mg.  Patient is also had problems with the right ankle history of ankle fracture treated conservatively without surgery.  She has had x-rays which are normal.  She has pain across the anterior aspect of her ankle she describes stiffness and soreness.  Review of Systems all the systems noncontributory to HPI.  Of note is history of tobacco alcohol, drug psychosis with  delusions, methamphetamine induced psychotic disorder depression and anxiety.   Objective: Vital Signs: BP 125/85    Pulse 97    Ht 5\' 10"  (1.778 m)    Wt 181 lb 11.2 oz (82.4 kg)    BMI 26.07 kg/m   Physical Exam Constitutional:      Appearance: She is well-developed.     Comments: Somewhat rambling history.  Pleasant responsive.  Appears to be mildly sedated.  HENT:     Head: Normocephalic.     Right Ear: External ear normal.     Left Ear: External ear normal. There is no impacted cerumen.  Eyes:     Pupils: Pupils are equal, round, and reactive to light.  Neck:     Thyroid: No thyromegaly.     Trachea: No tracheal deviation.  Cardiovascular:     Rate and Rhythm: Normal rate.  Pulmonary:     Effort: Pulmonary effort is normal.  Abdominal:     Palpations: Abdomen is soft.  Musculoskeletal:     Cervical back: No rigidity.  Skin:    General: Skin is warm and dry.  Neurological:     Mental Status: She is oriented to person, place, and time.  Psychiatric:  Behavior: Behavior normal.    Ortho Exam right ankle shows 70% range of motion versus left.  No tenderness of posterior tibial peroneal tendons no ankle effusion.  No tenderness anterolaterally negative anterior drawer subtalar motion midfoot motion is normal pulses are normal.  Mild sciatic notch tenderness on the right she is wearing lumbar compression stretch back brace.  Healed neck incision transverse in the midline.  Brachial plexus tenderness is present right and left.  No thenar atrophy.  Positive carpal compression test right and left.  Specialty Comments:  No specialty comments available.  Imaging: No results found.   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   Drug psychosis, with delusions ([place]) 01/17/2015   Amphetamine use disorder, 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   Tobacco use disorder 08/18/2014   Neck pain 08/17/2014   Ankle pain 08/17/2014   GAD (generalized anxiety disorder) 08/17/2014   Depression 08/17/2014   Opioid use disorder, severe, in controlled environment, dependence ([place]) 08/17/2014   History of alcohol dependence ([place]) 08/17/2014   Past Medical History:  Diagnosis Date   Allergy    Anxiety    Asthma     Family History  Problem Relation Age of Onset   COPD Mother    Anxiety disorder Mother    Emphysema Father    Alcohol abuse Father    Anxiety disorder Father    Leukemia Father    Anxiety disorder Sister    Bipolar disorder Sister    Colon cancer Maternal Grandmother     Past Surgical History:  Procedure Laterality Date   ANKLE SURGERY Right [place] SURGERY  2011   Social History   Occupational History   Not on file  Tobacco Use   Smoking status: Every Day    Packs/day: 2.00    Years: 28.00    Pack years: 56.00    Types: Cigarettes   Smokeless tobacco: Never   Tobacco comments:    1ppd-11/13/2020  Vaping Use   Vaping Use: Some days   Substances: Nicotine, Flavoring  Substance and Sexual Activity   Alcohol use: No    Alcohol/week: 0.0 standard drinks    Comment: quit drinking in 2010   Drug use: No   Sexual activity: Yes

## 2021-03-04 ENCOUNTER — Ambulatory Visit: Payer: 59 | Admitting: Licensed Clinical Social Worker

## 2021-03-04 NOTE — Chronic Care Management (AMB) (Signed)
Care Management Clinical Social Work Note  03/04/2021 Name: Gwendolyn Hansen MRN: [id number] DOB: 1970-06-20  [place] Gwendolyn Hansen is a 51 y.o. year old female who is a primary care patient of [name], [name], MD.  The Care Management team was consulted for assistance with chronic disease management and coordination needs.  Engaged with patient by telephone for follow up visit in response to provider referral for social work chronic care management and care coordination services  Consent to Services:  [name] was given information about Care Management services today including:  Care Management services includes personalized support from designated clinical staff supervised by her physician, including individualized plan of care and coordination with other care providers 24/7 contact phone numbers for assistance for urgent and routine care needs. The patient may stop case management services at any time by phone call to the office staff.  Patient agreed to services and consent obtained.   Summary: Assessed patient's current treatment, progress, coping skills, support system and barriers to care. Patient identified unhealthy coping mechanisms and was successful in identifying stronger strategies to manage ongoing stressors and pain management.  See Care Plan below for interventions and patient self-care activities.  Recommendation: Patient may benefit from, and is in agreement work with LCSW to address care coordination needs and will continue to work with the clinical team to address health care and disease management related needs.   Follow up Plan: Patient would like continued follow-up from CCM LCSW.  per patient's request will follow up in 06/05/21.  Will call office if needed prior to next encounter.   SDOH (Social Determinants of Health) assessments and interventions performed:    Advanced Directives Status: Not addressed in this encounter.  Care Plan  Allergies  Allergen  Reactions   Naloxone Swelling    Outpatient Encounter Medications as of 03/04/2021  Medication Sig Note   albuterol (PROVENTIL) (2.5 MG/3ML) 0.083% nebulizer solution Take 3 mLs (2.5 mg total) by nebulization every 4 (four) hours as needed.    albuterol (VENTOLIN HFA) 108 (90 Base) MCG/ACT inhaler Inhale 2 puffs into the lungs every 6 (six) hours as needed for wheezing or shortness of breath.    amitriptyline (ELAVIL) 150 MG tablet TAKE 1 TABLET(150 MG) BY MOUTH AT BEDTIME    buprenorphine (SUBUTEX) 8 MG SUBL SL tablet Place 4 mg under the tongue daily. 12/10/2020: Through them all away    diazepam (VALIUM) 10 MG tablet Take 10 mg by mouth 2 (two) times daily.    fluticasone (FLONASE) 50 MCG/ACT nasal spray Place 2 sprays into both nostrils daily.    hydrOXYzine (VISTARIL) 25 MG capsule TAKE 1 CAPSULE(25 MG) BY MOUTH EVERY 8 HOURS AS NEEDED    meclizine (ANTIVERT) 25 MG tablet Take 1 tablet (25 mg total) by mouth 3 (three) times daily as needed for dizziness.    montelukast (SINGULAIR) 10 MG tablet Take 10 mg by mouth daily.    nicotine (NICODERM CQ - DOSED IN MG/24 HOURS) 21 mg/24hr patch Place 1 patch (21 mg total) onto the skin daily.    Tiotropium Bromide-Olodaterol (STIOLTO RESPIMAT) 2.5-2.5 MCG/ACT AERS Inhale 2 puffs into the lungs daily.    No facility-administered encounter medications on file as of 03/04/2021.    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   Drug  psychosis, with delusions ([place]) 01/17/2015   Amphetamine use disorder, 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   Tobacco use disorder 08/18/2014   Neck pain 08/17/2014   Ankle pain 08/17/2014   GAD (generalized anxiety disorder) 08/17/2014   Depression 08/17/2014   Opioid use disorder, severe, in controlled environment, dependence ([place]) 08/17/2014   History of alcohol dependence ([place]) 08/17/2014    Conditions to be addressed/monitored: COPD, Anxiety, and Depression; Grief  Care Plan : LCSW Care Plan  Updates made by Rebekah Chesterfield, LCSW since 03/04/2021 12:00 AM     Problem: Coping Skills (General Plan of Care)      Long-Range Goal: Coping Skills Enhanced   Start Date: 10/18/2020  This Visit's Progress: On track  Recent Progress: On track  Priority: Medium  Note:   Current barriers:   Severe Persistent Mental Health needs related to Depression and Anxiety Mental Health Concerns  and Grief Needs Support, Education, and Care Coordination in order to meet unmet mental health needs. Clinical Goal(s): demonstrate a reduction in symptoms related to :Anxiety , Depression, Grief, and increase self-management skills     Clinical Interventions:  Assessed patient's previous and current treatment, coping skills, support system and barriers to care  Patient reports difficulty managing social anxiety all my life She has hx of medication management and therapy, sharing that speaking on trauma was ineffective for managing symptoms. Patient is currently grieving the loss of mother, noting she was my everything Denies SI/HI 11/03: Patient reports difficulty coping with grief of mother and brother in law within one year 2/13: Patient identified unhealthy coping mechanisms and was successful in identifying stronger strategies to manage ongoing stressors and pain management Patient reports difficulty managing pain (from a previous neck injury in 2010 that radiates down to ankle) resulting in decreased sleep.  Patient endorses feeling "loopy", irritated, and anxious. She has a scheduled appt with Ortho for 12/25/20 2/13: Patient reports she is feeling "much better" since discontinuing buprenorphine in Nov. Neurologist prescribed gabapentin. MRI is scheduled on 03/07/21. Pt has established with Orthopedics and is scheduled for Physical Therapy CCM LCSW provided validation and support. Psychoeducation regarding stages of grief was discussed and supportive resources were provided Patient has a strong support system consisting of fiance' and sister. Patient continues to spend time with mother's best friend although social anxiety symptoms can be difficult to manage Patient was successful in identifying coping skills 11/3: Patient picked up two medications today. She is also using OTC meds to assist with heartburn. CCM LCSW discussed how anxiety symptoms can show through mentally and physically. Strategies to assist with anxiety management discussed  Patient reports that hydroxyzine has not assisted in managing episodes of high anxiety. Patient was contacted by [facility] and informed that they did not feel she was a strong candidate for services. Encouraged her to initiate services through [facility]. Patient shared that she received services from [facility] in the past; however, her social anxiety symptoms are too severe to complete walk-in intake and/or attend in-person services. Patient reports that she stays in the house as much as she can. CCM LCSW provided patient with additional local agencies that provide medication management, including [employer] 11/03: Patient reports that she is interested in  initiating therapy to strengthen support system 2/13: Psychiatrist appt is scheduled for May 2023 She is in process of completing new patient ppwk CCM LCSW reviewed upcoming appointments and encouraged utilization of MyChart app to communicate with medical team. Patient plans to re-schedule with [facility] Depression  screen reviewed  Solution-Focused Strategies Active listening / Reflection utilized  Emotional Supportive Provided Provided psychoeducation for mental health needs  Quality of sleep assessed & Sleep Hygiene techniques promoted  Participation in counseling encouraged  Verbalization of feelings encouraged  Crisis Resource Education / information provided  Suicidal Ideation/Homicidal Ideation assessed: ; Review various resources, discussed options and provided patient information about  Options for mental health treatment based on need and insurance 1:1 collaboration with primary care provider regarding development and update of comprehensive plan of care as evidenced by provider attestation and co-signature Inter-disciplinary care team collaboration (see longitudinal plan of care) Patient Goals/Self-Care Activities: Over the next 120 days Continue with compliance of taking medication  Attend scheduled appointments with providers Contact clinic with any questions/concerns Utilize healthy coping skills discussed Complete new patient ppwk for Psychiatrist       Christa See, MSW, [email or web address] Phone [phone] 4:42 PM

## 2021-03-04 NOTE — Patient Instructions (Signed)
Visit Information  Thank you for taking time to visit with me today. Please don't hesitate to contact me if I can be of assistance to you before our next scheduled telephone appointment.  Following are the goals we discussed today:  Patient Goals/Self-Care Activities: Over the next 120 days Continue with compliance of taking medication  Attend scheduled appointments with providers Contact clinic with any questions/concerns Utilize healthy coping skills discussed Complete new patient ppwk for Psychiatrist  Our next appointment is by telephone on 06/05/21 at 10:45 AM  Please call the care guide team at [phone] if you need to cancel or reschedule your appointment.   If you are experiencing a Mental Health or Behavioral Health Crisis or need someone to talk to, please call the [place] National Suicide Prevention Lifeline: 865-115-8230 or TTY: 463-158-7563 TTY (618)525-6976) to talk to a trained counselor call 911    Jenel Lucks, MSW, LCSW [facility] [place]   [facility] [email or web address] Phone [phone] 4:43 PM

## 2021-03-05 ENCOUNTER — Encounter: Payer: Self-pay | Admitting: Orthopaedic Surgery

## 2021-03-05 ENCOUNTER — Encounter: Payer: Self-pay | Admitting: Nurse Practitioner

## 2021-03-06 ENCOUNTER — Ambulatory Visit: Payer: 59 | Admitting: Internal Medicine

## 2021-03-07 ENCOUNTER — Ambulatory Visit
Admission: RE | Admit: 2021-03-07 | Discharge: 2021-03-07 | Disposition: A | Discharge status: Home / Self Care | Payer: 59 | Source: Ambulatory Visit | Attending: Neurology | Admitting: Neurology

## 2021-03-07 ENCOUNTER — Other Ambulatory Visit: Payer: Self-pay

## 2021-03-07 DIAGNOSIS — M542 Cervicalgia: Secondary | ICD-10-CM | POA: Diagnosis not present

## 2021-03-07 IMAGING — MR MR CERVICAL SPINE W/O CM
5 series · 40 of 48 positions shown · non-contrast
Comparison: None.

CLINICAL DATA: Cspine surg [JM]; Injured in fall; c/o neck pain
with pain that radiates down RIGHT leg with tingling AND numbness
since injury in [JM]

EXAM:
MRI CERVICAL SPINE WITHOUT CONTRAST
TECHNIQUE: Multiplanar, multisequence MR imaging of the cervical spine was
performed. No intravenous contrast was administered.

[Series 5: T2 · sagittal · 3.0mm · 0.62mm/px · 6 of 15 slices shown (1 of 2)]
[im 1/15]
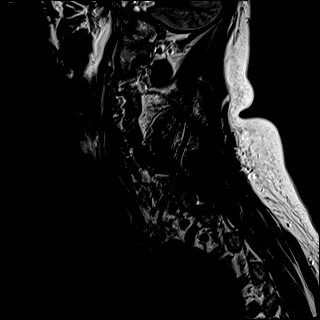
[im 3/15]
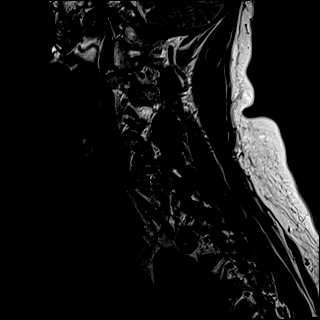
[im 6/15]
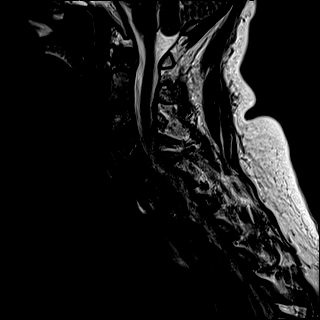
[im 9/15]
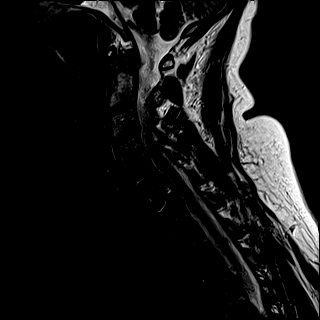
[im 12/15]
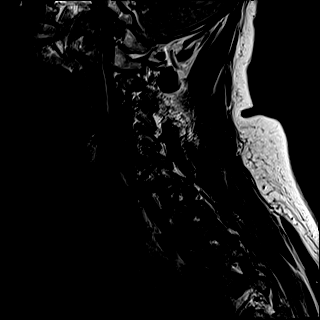
[im 15/15]
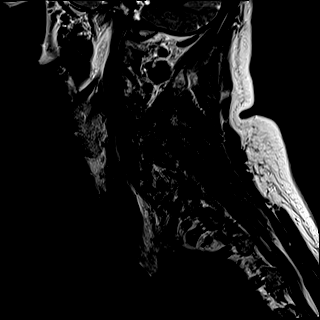

[Series 6: FLAIR · sagittal · 3.0mm · 0.78mm/px · 7 of 15 slices shown]
[im 1/15]
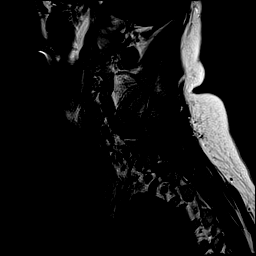
[im 3/15]
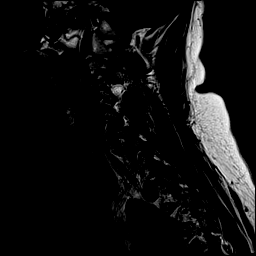
[im 5/15]
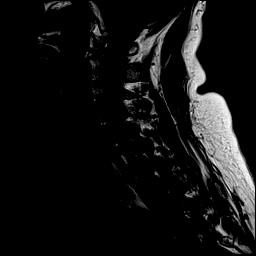
[im 8/15]
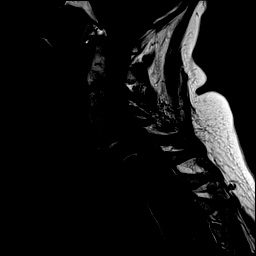
[im 10/15]
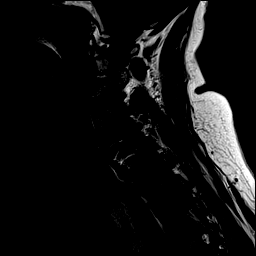
[im 12/15]
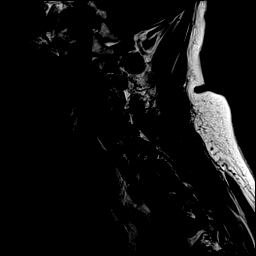
[im 15/15]
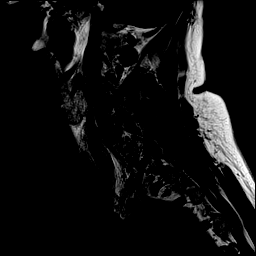

[Series 7: STIR · sagittal · 3.0mm · 0.62mm/px · 7 of 15 slices shown]
[im 1/15]
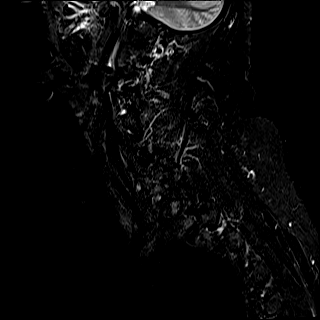
[im 3/15]
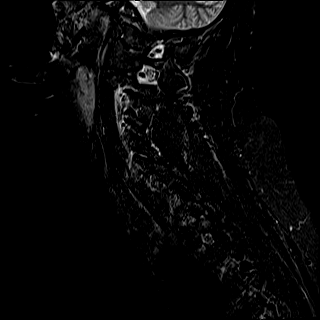
[im 5/15]
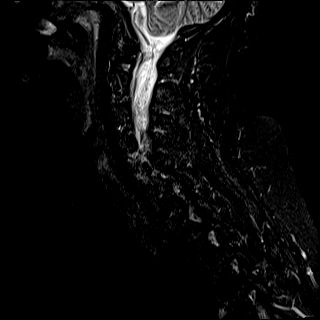
[im 8/15]
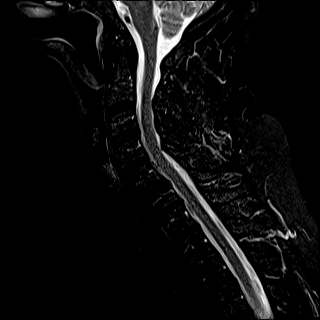
[im 10/15]
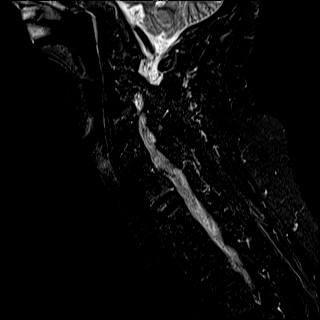
[im 12/15]
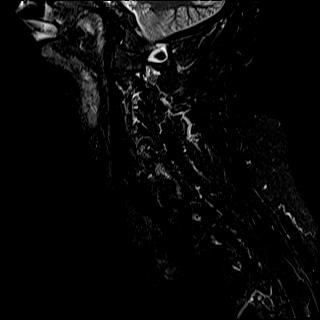
[im 15/15]
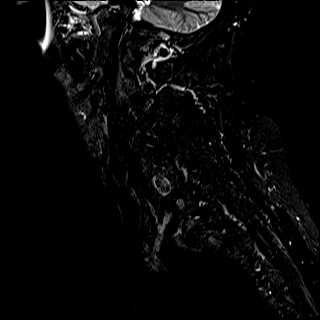

[Series 8: T2 · axial · 3.0mm · 0.70mm/px · z∈[-108,-9]mm · 12 of 30 slices shown (2 of 2)]
[im 1/30]
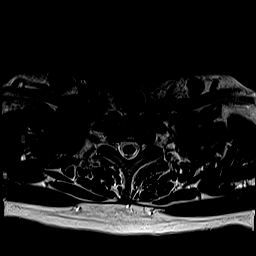
[im 3/30]
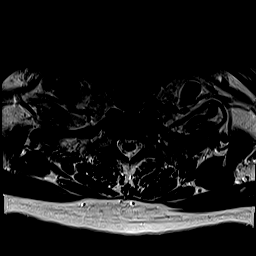
[im 5/30]
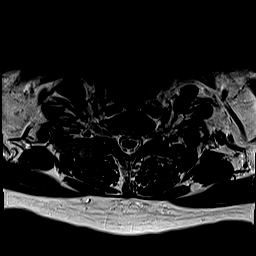
[im 7/30]
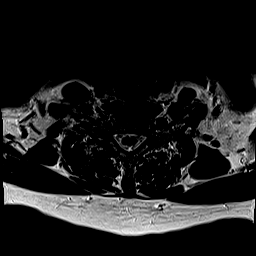
[im 9/30]
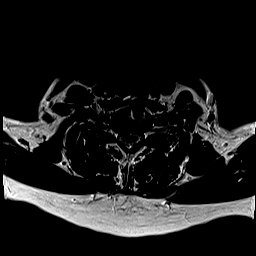
[im 12/30]
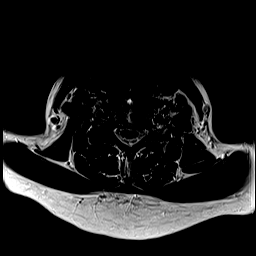
[im 14/30]
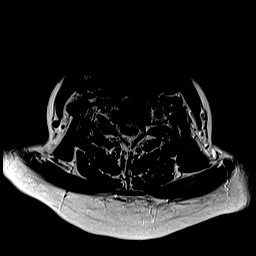
[im 16/30]
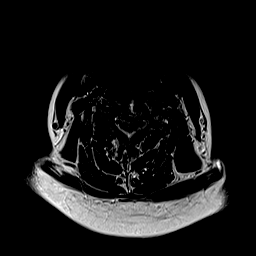
[im 18/30]
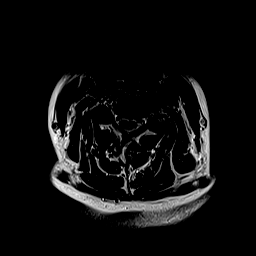
[im 21/30]
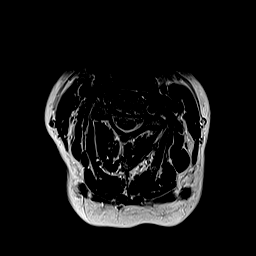
[im 25/30]
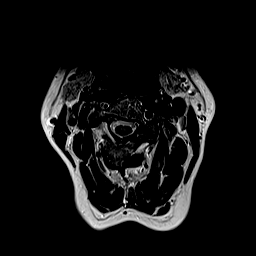
[im 30/30]
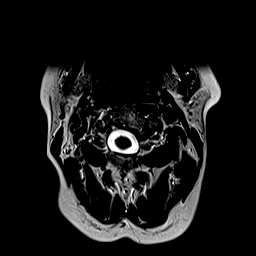

[Series 9: ax mpgr · axial · 3.0mm · 0.35mm/px · z∈[-108,-9]mm · 8 of 31 slices shown]
[im 1/31]
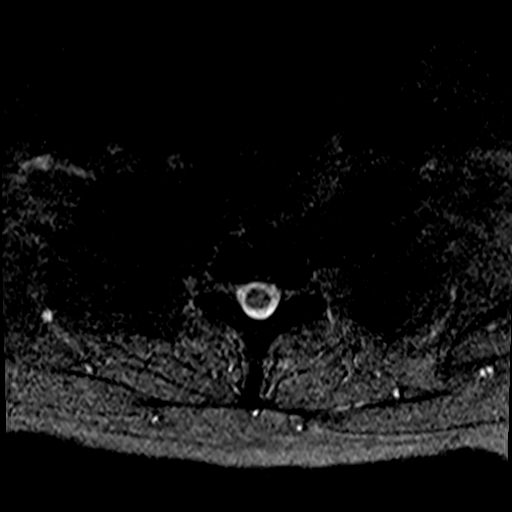
[im 5/31]
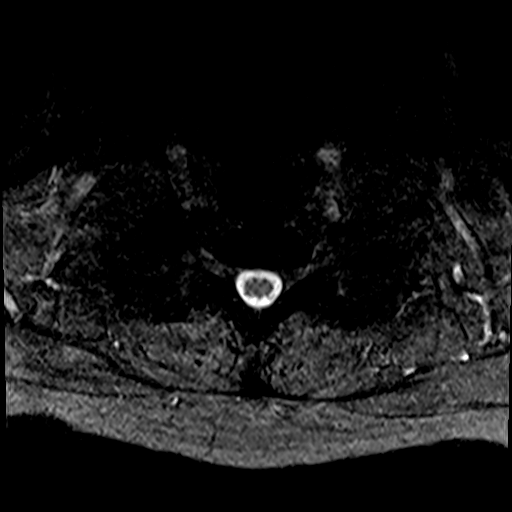
[im 10/31]
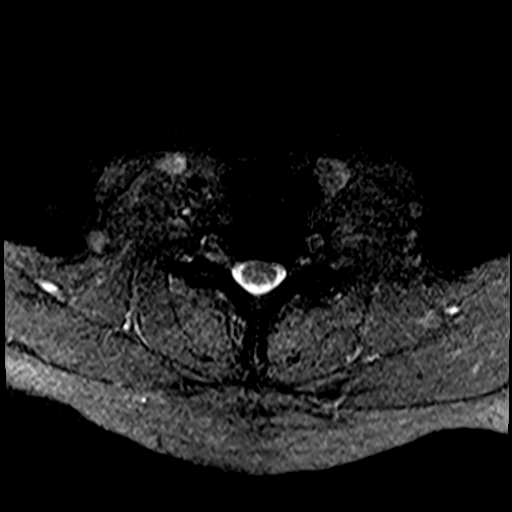
[im 14/31]
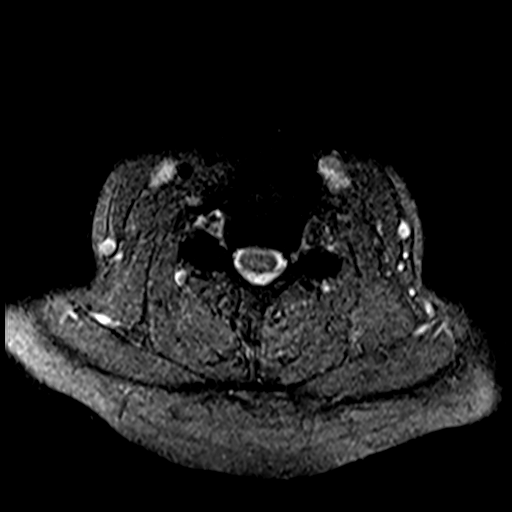
[im 17/31]
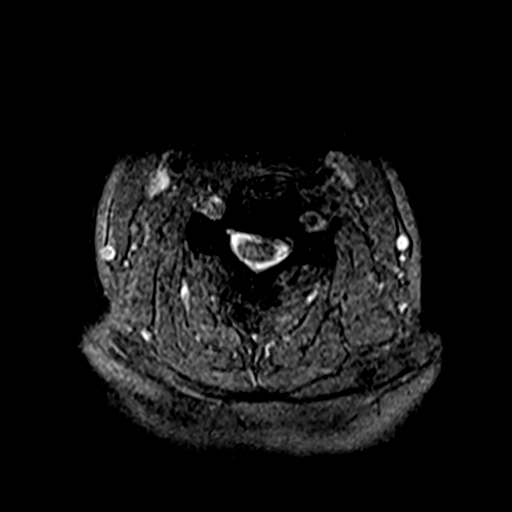
[im 21/31]
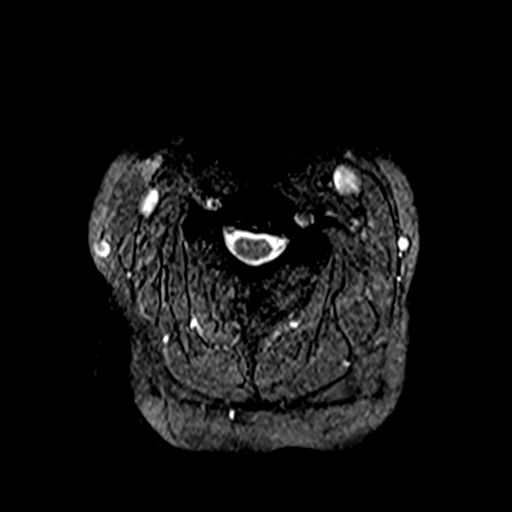
[im 26/31]
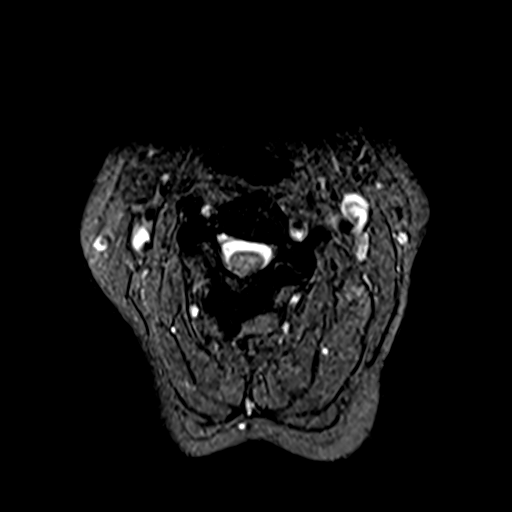
[im 31/31]
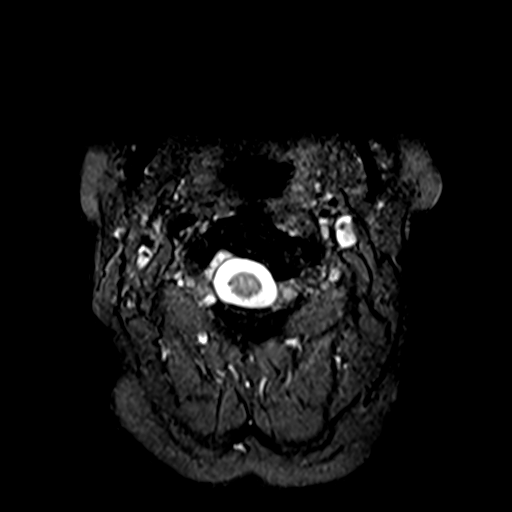

[40 of 48 positions shown; findings below may reference images not displayed]

FINDINGS: Alignment: Grade 1 anterolisthesis at C5-6

Vertebrae: C5-6 ACDF

Cord: Normal signal and morphology.

Posterior Fossa, vertebral arteries, paraspinal tissues: Negative.

Disc levels:

C1-2: Unremarkable.

C2-3: Normal disc space and facet joints. There is no spinal canal
stenosis. No neural foraminal stenosis.

C3-4: Small central disc protrusion. There is no spinal canal
stenosis. No neural foraminal stenosis.

C4-5: Normal disc space and facet joints. There is no spinal canal
stenosis. No neural foraminal stenosis.

C5-6: ACDF. There is no spinal canal stenosis. Mild right neural
foraminal stenosis.

C6-7: Normal disc space and facet joints. There is no spinal canal
stenosis. No neural foraminal stenosis.

C7-T1: Normal disc space and facet joints. There is no spinal canal
stenosis. No neural foraminal stenosis.
IMPRESSION: 1. C5-6 ACDF with mild right neural foraminal stenosis.
2. Mild C3-4 degenerative disc disease without spinal canal or
neural foraminal stenosis.

## 2021-03-08 DIAGNOSIS — Z1231 Encounter for screening mammogram for malignant neoplasm of breast: Secondary | ICD-10-CM

## 2021-03-12 ENCOUNTER — Encounter: Payer: Self-pay | Admitting: Orthopaedic Surgery

## 2021-03-12 ENCOUNTER — Other Ambulatory Visit: Payer: Self-pay | Admitting: Internal Medicine

## 2021-03-12 ENCOUNTER — Ambulatory Visit (INDEPENDENT_AMBULATORY_CARE_PROVIDER_SITE_OTHER): Payer: 59 | Admitting: Internal Medicine

## 2021-03-12 ENCOUNTER — Ambulatory Visit: Payer: 59 | Admitting: Internal Medicine

## 2021-03-12 ENCOUNTER — Other Ambulatory Visit: Payer: Self-pay

## 2021-03-12 ENCOUNTER — Ambulatory Visit (INDEPENDENT_AMBULATORY_CARE_PROVIDER_SITE_OTHER): Payer: 59 | Admitting: Orthopaedic Surgery

## 2021-03-12 ENCOUNTER — Encounter: Payer: Self-pay | Admitting: Internal Medicine

## 2021-03-12 VITALS — BP 106/75 | HR 91 | Temp 98.8°F | Ht 70.0 in | Wt 187.2 lb

## 2021-03-12 VITALS — BP 116/79 | HR 87 | Ht 70.0 in | Wt 187.0 lb

## 2021-03-12 DIAGNOSIS — Z13220 Encounter for screening for lipoid disorders: Secondary | ICD-10-CM | POA: Diagnosis not present

## 2021-03-12 DIAGNOSIS — R202 Paresthesia of skin: Secondary | ICD-10-CM

## 2021-03-12 DIAGNOSIS — N39498 Other specified urinary incontinence: Secondary | ICD-10-CM | POA: Diagnosis not present

## 2021-03-12 DIAGNOSIS — R200 Anesthesia of skin: Secondary | ICD-10-CM

## 2021-03-12 DIAGNOSIS — F411 Generalized anxiety disorder: Secondary | ICD-10-CM | POA: Diagnosis not present

## 2021-03-12 DIAGNOSIS — R32 Unspecified urinary incontinence: Secondary | ICD-10-CM | POA: Insufficient documentation

## 2021-03-12 DIAGNOSIS — R3915 Urgency of urination: Secondary | ICD-10-CM

## 2021-03-12 LAB — URINALYSIS, ROUTINE W REFLEX MICROSCOPIC
Bilirubin, UA: NEGATIVE
Glucose, UA: NEGATIVE
Ketones, UA: NEGATIVE
Leukocytes,UA: NEGATIVE
Nitrite, UA: NEGATIVE
Protein,UA: NEGATIVE
RBC, UA: NEGATIVE
Specific Gravity, UA: 1.01 (ref 1.005–1.030)
Urobilinogen, Ur: 0.2 mg/dL (ref 0.2–1.0)
pH, UA: 6 (ref 5.0–7.5)

## 2021-03-12 MED ORDER — OXYBUTYNIN CHLORIDE ER 10 MG PO TB24: 10.0000 mg | ORAL_TABLET | Freq: Every day | ORAL | 1 refills | Status: DC

## 2021-03-12 MED ORDER — HYDROXYZINE PAMOATE 25 MG PO CAPS: ORAL_CAPSULE | ORAL | 0 refills | Status: DC

## 2021-03-12 NOTE — Progress Notes (Signed)
BP 106/75    Pulse 91    Temp 98.8 F (37.1 C) (Oral)    Ht 5\' 10"  (1.778 m)    Wt 187 lb 3.2 oz (84.9 kg)    SpO2 95%    BMI 26.86 kg/m    Subjective:    Patient ID: , female    DOB: [birth year], 51 y.o.   MRN: [id number]  Chief Complaint  Patient presents with   Neck Pain    Has appt today at 10am with Ortho, had MRI on 2/16   Hypertension    HPI: Gwendolyn Hansen is a 51 y.o. female  Neck Pain  This is a chronic problem. The current episode started more than 1 year ago. The problem has been waxing and waning. Pertinent negatives include no chest pain, fever, photophobia or syncope.  Hypertension This is a chronic problem. The problem is controlled. Associated symptoms include neck pain. Pertinent negatives include no anxiety, blurred vision, chest pain, malaise/fatigue, peripheral edema or PND.  Urinary Frequency  This is a new problem. The current episode started 1 to 4 weeks ago. The quality of the pain is described as burning. The pain is at a severity of 3/10. The pain is mild. Associated symptoms include frequency and urgency. Pertinent negatives include no chills or discharge. Associated symptoms comments: Has had incontinence x 6 months or so urgency and frequency has had accidents x evry day.   Chief Complaint  Patient presents with   Neck Pain    Has appt today at 10am with Ortho, had MRI on 2/16   Hypertension    Relevant past medical, surgical, family and social history reviewed and updated as indicated. Interim medical history since our last visit reviewed. Allergies and medications reviewed and updated.  Review of Systems  Constitutional:  Negative for chills, fever and malaise/fatigue.  Eyes:  Negative for blurred vision and photophobia.  Cardiovascular:  Negative for chest pain, syncope and PND.  Genitourinary:  Positive for frequency and urgency.  Musculoskeletal:  Positive for neck pain.   Per HPI unless specifically indicated  above     Objective:    BP 106/75    Pulse 91    Temp 98.8 F (37.1 C) (Oral)    Ht 5\' 10"  (1.778 m)    Wt 187 lb 3.2 oz (84.9 kg)    SpO2 95%    BMI 26.86 kg/m   Wt Readings from Last 3 Encounters:  03/12/21 187 lb 3.2 oz (84.9 kg)  02/27/21 181 lb 11.2 oz (82.4 kg)  12/10/20 197 lb 12.8 oz (89.7 kg)    Physical Exam Vitals and nursing note reviewed.  Constitutional:      General: She is not in acute distress.    Appearance: Normal appearance. She is not ill-appearing or diaphoretic.  Eyes:     Conjunctiva/sclera: Conjunctivae normal.  Pulmonary:     Breath sounds: No rhonchi.  Abdominal:     General: Abdomen is flat. Bowel sounds are normal. There is no distension.     Palpations: Abdomen is soft. There is no mass.     Tenderness: There is no abdominal tenderness. There is no guarding.  Skin:    General: Skin is warm and dry.     Coloration: Skin is not jaundiced.     Findings: No erythema.  Neurological:     Mental Status: She is alert.    Results for orders placed or performed in visit on 11/29/20  Pulmonary Function  Test [facility] Only  Result Value Ref Range   FVC-Pre 3.19 L   FVC-%Pred-Pre 77 %   FVC-Post 3.51 L   FVC-%Pred-Post 84 %   FVC-%Change-Post 9 %   FEV1-Pre 1.83 L   FEV1-%Pred-Pre 56 %   FEV1-Post 1.95 L   FEV1-%Pred-Post 60 %   FEV1-%Change-Post 6 %   FEV6-Pre 3.10 L   FEV6-%Pred-Pre 77 %   FEV6-Post 3.39 L   FEV6-%Pred-Post 84 %   FEV6-%Change-Post 9 %   Pre FEV1/FVC ratio 57 %   FEV1FVC-%Pred-Pre 71 %   Post FEV1/FVC ratio 56 %   FEV1FVC-%Change-Post -2 %   Pre FEV6/FVC Ratio 97 %   FEV6FVC-%Pred-Pre 99 %   Post FEV6/FVC ratio 97 %   FEV6FVC-%Pred-Post 99 %   FEV6FVC-%Change-Post 0 %   FEF 25-75 Pre 0.97 L/sec   FEF2575-%Pred-Pre 32 %   FEF 25-75 Post 1.18 L/sec   FEF2575-%Pred-Post 39 %   FEF2575-%Change-Post 21 %   RV 2.81 L   RV % pred 135 %   TLC 5.86 L   TLC % pred 100 %   DLCO unc 20.82 ml/min/mmHg   DLCO unc % pred 84 %    DL/VA 6.62 ml/min/mmHg/L   DL/VA % pred 947 %        Current Outpatient Medications:    albuterol (PROVENTIL) (2.5 MG/3ML) 0.083% nebulizer solution, Take 3 mLs (2.5 mg total) by nebulization every 4 (four) hours as needed., Disp: 75 mL, Rfl: 12   albuterol (VENTOLIN HFA) 108 (90 Base) MCG/ACT inhaler, Inhale 2 puffs into the lungs every 6 (six) hours as needed for wheezing or shortness of breath., Disp: 8 g, Rfl: 3   amitriptyline (ELAVIL) 150 MG tablet, TAKE 1 TABLET(150 MG) BY MOUTH AT BEDTIME, Disp: 90 tablet, Rfl: 0   cholecalciferol (VITAMIN D3) 25 MCG (1000 UNIT) tablet, Take 1,000 Units by mouth daily., Disp: , Rfl:    diazepam (VALIUM) 10 MG tablet, Take 10 mg by mouth 2 (two) times daily., Disp: , Rfl:    fluticasone (FLONASE) 50 MCG/ACT nasal spray, Place 2 sprays into both nostrils daily., Disp: 16 g, Rfl: 6   gabapentin (NEURONTIN) 300 MG capsule, , Disp: , Rfl:    hydrOXYzine (VISTARIL) 25 MG capsule, TAKE 1 CAPSULE(25 MG) BY MOUTH EVERY 8 HOURS AS NEEDED, Disp: 90 capsule, Rfl: 0   lisinopril (ZESTRIL) 20 MG tablet, Take 20 mg by mouth daily., Disp: , Rfl:    magnesium 30 MG tablet, Take 30 mg by mouth 2 (two) times daily., Disp: , Rfl:    montelukast (SINGULAIR) 10 MG tablet, Take 10 mg by mouth daily., Disp: , Rfl:    Multiple Vitamin (MULTI-VITAMIN) tablet, Take 1 tablet by mouth daily., Disp: , Rfl:    nicotine (NICODERM CQ - DOSED IN MG/24 HOURS) 21 mg/24hr patch, Place 1 patch (21 mg total) onto the skin daily., Disp: 30 patch, Rfl: 0   oxybutynin (DITROPAN XL) 10 MG 24 hr tablet, Take 1 tablet (10 mg total) by mouth at bedtime., Disp: 30 tablet, Rfl: 1   tiotropium (SPIRIVA HANDIHALER) 18 MCG inhalation capsule, INL CONTENTS OF 1 C ONCE DAILY USING HANDIHALER AS DIRECTED, Disp: , Rfl:    Turmeric (QC TUMERIC COMPLEX) 500 MG CAPS, Take by mouth., Disp: , Rfl:     Assessment & Plan:  Cervicalgia with right cervical radiculopathy symptoms, status post C5-C6 ACDF in 2009  has had an MRI which showed 1. C5-6 ACDF with mild right neural foraminal stenosis.  2. Mild C3-4 degenerative disc disease without spinal canal or neural foraminal stenosis.   Urinary freq/ urgency? Urge Incontinence will start pt on oxybutynin for such consider uroology eval and tx if doenst help pt to call x 1 week.  Smoking cessation  continue  Smoking 1 - 2 cigs using vap Wheezing - will need to continue nicoderm and   Right ankle pain and numbness to see ortho and right hip pain See ortho later today to f/u with them regarding such   Problem List Items Addressed This Visit       Other   GAD (generalized anxiety disorder) - Primary   Relevant Orders   Urinalysis, Routine w reflex microscopic   CBC with Differential/Platelet   Lipid panel   Comprehensive metabolic panel   Other Visit Diagnoses     Screening for lipid disorders       Relevant Orders   Lipid panel        Orders Placed This Encounter  Procedures   Urinalysis, Routine w reflex microscopic   CBC with Differential/Platelet   Lipid panel   Comprehensive metabolic panel     Meds ordered this encounter  Medications   oxybutynin (DITROPAN XL) 10 MG 24 hr tablet    Sig: Take 1 tablet (10 mg total) by mouth at bedtime.    Dispense:  30 tablet    Refill:  1     Follow up plan: No follow-ups on file.

## 2021-03-12 NOTE — Progress Notes (Signed)
Office Visit Note   Patient: Gwendolyn Hansen           Date of Birth: 10-28-1970           MRN: [id number] Visit Date: 03/12/2021              Requested by: Charlynne Cousins, MD [address] [place],  [place] [address] PCP: Charlynne Cousins, MD   Assessment & Plan: Visit Diagnoses:  1. Numbness and tingling in right hand   2. Numbness of right hand     Plan: We will set patient up for a nerve conduction velocities to rule out carpal tunnel syndrome on the right.  MRI scan did not show any compression in the cervical spine other than mild and her cervical fusion appears solid.  Follow-up after electrical test.  Follow-Up Instructions: No follow-ups on file.   Orders:  Orders Placed This Encounter  Procedures   Ambulatory referral to Physical Medicine Rehab   No orders of the defined types were placed in this encounter.     Procedures: No procedures performed   Clinical Data: No additional findings.   Subjective: Chief Complaint  Patient presents with   Neck - Follow-up    MRI cervical spine review    HPI 51 year old female smoker with problems with right hand numbness with dropping objects tingling.  She also noticed some burning in her right leg and right foot at times with low back pain on the right side.  Patient had seen Dr.Shah part of [facility] due to neurology department but he is not in her insurance network.  Patient states she has urinary frequency and does not make the bathroom at times.  She is given medication by her PCP.  Patient is on pain management chronic diazepam and last year was on buprenorphine 8 mg strength.  Past admission for methamphetamine induced psychotic disorder.  Cervical MRI 03/08/2021 showed C5-6 fusion with mild neuroforaminal stenosis.  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  This test was ordered by Dr. Manuella Ghazi.  Patient's cervical fusion was 2009.  Review of Systems all other systems noncontributory to HPI.  Negative for  myelopathic symptoms.   Objective: Vital Signs: BP 116/79    Pulse 87    Ht 5\' 10"  (1.778 m)    Wt 187 lb (84.8 kg)    BMI 26.83 kg/m   Physical Exam Constitutional:      Appearance: She is well-developed.  HENT:     Head: Normocephalic.     Right Ear: External ear normal.     Left Ear: External ear normal. There is no impacted cerumen.  Eyes:     Pupils: Pupils are equal, round, and reactive to light.  Neck:     Thyroid: No thyromegaly.     Trachea: No tracheal deviation.  Cardiovascular:     Rate and Rhythm: Normal rate.  Pulmonary:     Effort: Pulmonary effort is normal.  Abdominal:     Palpations: Abdomen is soft.  Musculoskeletal:     Cervical back: No rigidity.  Skin:    General: Skin is warm and dry.  Neurological:     Mental Status: She is alert and oriented to person, place, and time.  Psychiatric:        Behavior: Behavior normal.    Ortho Exam patient has positive Phalen's some pain with median nerve compression at the wrist mild right thenar atrophy no atrophy of the left thenar region.  No interosseous weakness ulnar  nerve at the elbow is normal.  Specialty Comments:  No specialty comments available.  Imaging: No results found.   PMFS History: Patient Active Problem List   Diagnosis Date Noted   Numbness of right hand 03/18/2021   Screening for lipid disorders 03/12/2021   Urinary urgency 03/12/2021   Absence of bladder continence 03/12/2021   Bilateral hand numbness 02/27/2021   Recurrent sinus infections 12/10/2020   Disorientation 12/10/2020   COPD GOLD 2/ active smoker  11/13/2020   Sinusitis 10/23/2020   Cough 10/04/2020   Anxiety 10/04/2020   Encounter for screening mammogram for malignant neoplasm of breast 10/04/2020   Cigarette smoker 10/04/2020   Screening for colon cancer 10/04/2020   Need for influenza vaccination 10/04/2020   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    Drug psychosis, 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   Amphetamine use disorder, 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   Tobacco use disorder 08/18/2014   Neck pain 08/17/2014   Ankle pain 08/17/2014   GAD (generalized anxiety disorder) 08/17/2014   Depression 08/17/2014   Opioid use disorder, severe, in controlled environment, dependence ([place]) 08/17/2014   History of alcohol dependence ([place]) 08/17/2014   Past Medical History:  Diagnosis Date   Allergy    Anxiety    Asthma     Family History  Problem Relation Age of Onset   COPD Mother    Anxiety disorder Mother    Emphysema Father    Alcohol abuse Father    Anxiety disorder Father    Leukemia Father    Anxiety disorder Sister    Bipolar disorder Sister    Colon cancer Maternal Grandmother     Past Surgical History:  Procedure Laterality Date   ANKLE SURGERY Right [place] SURGERY  2011   Social History   Occupational History   Not on file  Tobacco Use   Smoking status: Every Day    Packs/day: 2.00    Years: 28.00    Pack years: 56.00    Types: Cigarettes   Smokeless tobacco: Never   Tobacco comments:    1ppd-11/13/2020  Vaping Use   Vaping Use: Some days   Substances: Nicotine, Flavoring  Substance and Sexual Activity   Alcohol use: No    Alcohol/week: 0.0 standard drinks    Comment: quit drinking in 2010   Drug use: No   Sexual activity: Yes

## 2021-03-13 LAB — COMPREHENSIVE METABOLIC PANEL
ALT: 68 IU/L — ABNORMAL HIGH (ref 0–32)
AST: 44 IU/L — ABNORMAL HIGH (ref 0–40)
Albumin/Globulin Ratio: 2.8 — ABNORMAL HIGH (ref 1.2–2.2)
Albumin: 4.8 g/dL (ref 3.8–4.9)
Alkaline Phosphatase: 51 IU/L (ref 44–121)
BUN/Creatinine Ratio: 17 (ref 9–23)
BUN: 19 mg/dL (ref 6–24)
Bilirubin Total: <0.2 mg/dL (ref 0.0–1.2)
CO2: 27 mmol/L (ref 20–29)
Calcium: 10.2 mg/dL (ref 8.7–10.2)
Chloride: 100 mmol/L (ref 96–106)
Creatinine, Ser: 1.09 mg/dL — ABNORMAL HIGH (ref 0.57–1.00)
Globulin, Total: 1.7 g/dL (ref 1.5–4.5)
Glucose: 96 mg/dL (ref 70–99)
Potassium: 4.6 mmol/L (ref 3.5–5.2)
Sodium: 138 mmol/L (ref 134–144)
Total Protein: 6.5 g/dL (ref 6.0–8.5)
eGFR: 61 mL/min/{1.73_m2} (ref >59)

## 2021-03-13 LAB — CBC WITH DIFFERENTIAL/PLATELET
Basophils Absolute: 0 10*3/uL (ref 0.0–0.2)
Basos: 0 %
EOS (ABSOLUTE): 0.1 10*3/uL (ref 0.0–0.4)
Eos: 1 %
Hematocrit: 42.6 % (ref 34.0–46.6)
Hemoglobin: 14.1 g/dL (ref 11.1–15.9)
Immature Grans (Abs): 0 10*3/uL (ref 0.0–0.1)
Immature Granulocytes: 0 %
Lymphocytes Absolute: 1.5 10*3/uL (ref 0.7–3.1)
Lymphs: 15 %
MCH: 30.5 pg (ref 26.6–33.0)
MCHC: 33.1 g/dL (ref 31.5–35.7)
MCV: 92 fL (ref 79–97)
Monocytes Absolute: 0.5 10*3/uL (ref 0.1–0.9)
Monocytes: 5 %
Neutrophils Absolute: 7.5 10*3/uL — ABNORMAL HIGH (ref 1.4–7.0)
Neutrophils: 79 %
Platelets: 329 10*3/uL (ref 150–450)
RBC: 4.62 x10E6/uL (ref 3.77–5.28)
RDW: 13.8 % (ref 11.7–15.4)
WBC: 9.6 10*3/uL (ref 3.4–10.8)

## 2021-03-13 LAB — LIPID PANEL
Chol/HDL Ratio: 3.9 ratio (ref 0.0–4.4)
Cholesterol, Total: 260 mg/dL — ABNORMAL HIGH (ref 100–199)
HDL: 66 mg/dL (ref >39)
LDL Chol Calc (NIH): 174 mg/dL — ABNORMAL HIGH (ref 0–99)
Triglycerides: 113 mg/dL (ref 0–149)
VLDL Cholesterol Cal: 20 mg/dL (ref 5–40)

## 2021-03-14 ENCOUNTER — Other Ambulatory Visit: Payer: Self-pay | Admitting: Internal Medicine

## 2021-03-14 NOTE — Telephone Encounter (Signed)
Requested Prescriptions  Pending Prescriptions Disp Refills   amitriptyline (ELAVIL) 150 MG tablet [Pharmacy Med Name: AMITRIPTYLINE 150MG  TABLETS] 90 tablet 0    Sig: TAKE 1 TABLET(150 MG) BY MOUTH AT BEDTIME     Psychiatry:  Antidepressants - Heterocyclics (TCAs) Passed - 03/14/2021  8:30 AM      Passed - Completed PHQ-2 or PHQ-9 in the last 360 days      Passed - Valid encounter within last 6 months    Recent Outpatient Visits          2 days ago GAD (generalized anxiety disorder)   [facility] [name], [name], MD   3 months ago Recurrent sinus infections   [facility] [name], [name], MD   3 months ago Neck pain   [facility] [name], [name], MD   4 months ago Acute cystitis without hematuria   [place], NP   4 months ago Sinusitis, unspecified chronicity, unspecified location   [facility] [name], [name], MD

## 2021-03-15 ENCOUNTER — Encounter: Payer: Self-pay | Admitting: Nurse Practitioner

## 2021-03-18 DIAGNOSIS — R200 Anesthesia of skin: Secondary | ICD-10-CM | POA: Insufficient documentation

## 2021-03-19 ENCOUNTER — Telehealth (INDEPENDENT_AMBULATORY_CARE_PROVIDER_SITE_OTHER): Payer: 59 | Admitting: Internal Medicine

## 2021-03-19 DIAGNOSIS — R062 Wheezing: Secondary | ICD-10-CM | POA: Insufficient documentation

## 2021-03-19 DIAGNOSIS — F17200 Nicotine dependence, unspecified, uncomplicated: Secondary | ICD-10-CM | POA: Diagnosis not present

## 2021-03-19 DIAGNOSIS — J019 Acute sinusitis, unspecified: Secondary | ICD-10-CM | POA: Insufficient documentation

## 2021-03-19 MED ORDER — METHYLPREDNISOLONE 4 MG PO TBPK: ORAL_TABLET | ORAL | 0 refills | Status: DC

## 2021-03-19 MED ORDER — AMOXICILLIN-POT CLAVULANATE 875-125 MG PO TABS: 1.0000 | ORAL_TABLET | Freq: Two times a day (BID) | ORAL | 0 refills | Status: AC

## 2021-03-19 MED ORDER — ATORVASTATIN CALCIUM 20 MG PO TABS: 20.0000 mg | ORAL_TABLET | Freq: Every day | ORAL | 3 refills | Status: DC

## 2021-03-19 NOTE — Progress Notes (Unsigned)
There were no vitals taken for this visit.   Subjective:    Patient ID: Gwendolyn Hansen, female    DOB: 01-30-1970, 51 y.o.   MRN: [id number]  No chief complaint on file.   HPI: Gwendolyn Hansen is a 51 y.o. female   {Blank single:19197::"This visit was completed via telephone due to the restrictions of the COVID-19 pandemic. All issues as above were discussed and addressed but no physical exam was performed. If it was felt that the patient should be evaluated in the office, they were directed there. The patient verbally consented to this visit. Patient was unable to complete an audio/visual visit due to {Blank single:19197::"Technical difficulties", "Lack of internet","Lack of equipment","***"}. Due to the catastrophic nature of the COVID-19 pandemic, this visit was done through audio contact only.","This visit was completed via video visit through {Blank single:19197::"MyChart","FaceTime", "Skype", "Doximity", "***"} due to the restrictions of the COVID-19 pandemic. All issues as above were discussed and addressed. Physical exam was done as above through visual confirmation on video through {Blank single:19197::"MyChart", "FaceTime", "Skype", "Doximity", "***"}. If it was felt that the patient should be evaluated in the office, they were directed there. The patient verbally consented to this visit."} Location of the patient: {Blank single:19197::"home","work","parking lot", "***"} Location of the provider: {Blank single:19197::"home","work"} Those involved with this call:  Provider: {Blank single:19197::"Jolene Silvana, DNP","Megan Thornell Mule, [place]", "Charlynne Cousins, MD",  "Billy Fischer, DNP",  "Kathrine Haddock, DNP"} CMA: {Blank single:19197::"Tiffany Reel, CMA","Brittany Joneen Caraway, CMA", "Louanna Raw, CMA", "Frazier Butt, CMA", "Irena Reichmann, CMA"} Front Desk/Registration: {Blank single:19197::"Katina [employer]", "[employer]", "LaWana Djimraou"}   Time spent on call: {Blank single:19197::"*** minutes on the phone discussing health concerns. *** minutes total spent in review of patient's record and preparation of their chart.","*** minutes with patient face to face via video conference. More than 50% of this time was spent in counseling and coordination of care. *** minutes total spent in review of patient's record and preparation of their chart."}    Sinus Problem This is a new problem. The current episode started in the past 7 days. The problem has been gradually worsening since onset. There has been no fever. Associated symptoms include coughing.  Cough This is a new problem. The cough is Productive of brown sputum. Pertinent negatives include no heartburn, hemoptysis or rhinorrhea. Associated symptoms comments: Yellowish d/c from nose  Has been wheezing.   No chief complaint on file.   Relevant past medical, surgical, family and social history reviewed and updated as indicated. Interim medical history since our last visit reviewed. Allergies and medications reviewed and updated.  Review of Systems  HENT:  Negative for rhinorrhea.   Respiratory:  Positive for cough. Negative for hemoptysis.   Gastrointestinal:  Negative for heartburn.   Per HPI unless specifically indicated above     Objective:    There were no vitals taken for this visit.  Wt Readings from Last 3 Encounters:  03/12/21 187 lb (84.8 kg)  03/12/21 187 lb 3.2 oz (84.9 kg)  02/27/21 181 lb 11.2 oz (82.4 kg)    Physical Exam  Results for orders placed or performed in visit on 03/12/21  Urinalysis, Routine w reflex microscopic  Result Value Ref Range   Specific Gravity, UA 1.010 1.005 - 1.030   pH, UA 6.0 5.0 - 7.5   Color, UA Yellow Yellow   Appearance Ur Clear Clear   Leukocytes,UA Negative Negative   Protein,UA Negative Negative/Trace   Glucose, UA Negative Negative  Ketones, UA Negative Negative   RBC, UA Negative Negative   Bilirubin, UA  Negative Negative   Urobilinogen, Ur 0.2 0.2 - 1.0 mg/dL   Nitrite, UA Negative Negative  CBC with Differential/Platelet  Result Value Ref Range   WBC 9.6 3.4 - 10.8 x10E3/uL   RBC 4.62 3.77 - 5.28 x10E6/uL   Hemoglobin 14.1 11.1 - 15.9 g/dL   Hematocrit 42.6 34.0 - 46.6 %   MCV 92 79 - 97 fL   MCH 30.5 26.6 - 33.0 pg   MCHC 33.1 31.5 - 35.7 g/dL   RDW 13.8 11.7 - 15.4 %   Platelets 329 150 - 450 x10E3/uL   Neutrophils 79 Not Estab. %   Lymphs 15 Not Estab. %   Monocytes 5 Not Estab. %   Eos 1 Not Estab. %   Basos 0 Not Estab. %   Neutrophils Absolute 7.5 (H) 1.4 - 7.0 x10E3/uL   Lymphocytes Absolute 1.5 0.7 - 3.1 x10E3/uL   Monocytes Absolute 0.5 0.1 - 0.9 x10E3/uL   EOS (ABSOLUTE) 0.1 0.0 - 0.4 x10E3/uL   Basophils Absolute 0.0 0.0 - 0.2 x10E3/uL   Immature Granulocytes 0 Not Estab. %   Immature Grans (Abs) 0.0 0.0 - 0.1 x10E3/uL  Comprehensive metabolic panel  Result Value Ref Range   Glucose 96 70 - 99 mg/dL   BUN 19 6 - 24 mg/dL   Creatinine, Ser 1.09 (H) 0.57 - 1.00 mg/dL   eGFR 61 >59 mL/min/1.73   BUN/Creatinine Ratio 17 9 - 23   Sodium 138 134 - 144 mmol/L   Potassium 4.6 3.5 - 5.2 mmol/L   Chloride 100 96 - 106 mmol/L   CO2 27 20 - 29 mmol/L   Calcium 10.2 8.7 - 10.2 mg/dL   Total Protein 6.5 6.0 - 8.5 g/dL   Albumin 4.8 3.8 - 4.9 g/dL   Globulin, Total 1.7 1.5 - 4.5 g/dL   Albumin/Globulin Ratio 2.8 (H) 1.2 - 2.2   Bilirubin Total <0.2 0.0 - 1.2 mg/dL   Alkaline Phosphatase 51 44 - 121 IU/L   AST 44 (H) 0 - 40 IU/L   ALT 68 (H) 0 - 32 IU/L  Lipid panel  Result Value Ref Range   Cholesterol, Total 260 (H) 100 - 199 mg/dL   Triglycerides 113 0 - 149 mg/dL   HDL 66 >39 mg/dL   VLDL Cholesterol Cal 20 5 - 40 mg/dL   LDL Chol Calc (NIH) 174 (H) 0 - 99 mg/dL   Chol/HDL Ratio 3.9 0.0 - 4.4 ratio        Current Outpatient Medications:    albuterol (PROVENTIL) (2.5 MG/3ML) 0.083% nebulizer solution, Take 3 mLs (2.5 mg total) by nebulization every 4 (four)  hours as needed., Disp: 75 mL, Rfl: 12   albuterol (VENTOLIN HFA) 108 (90 Base) MCG/ACT inhaler, Inhale 2 puffs into the lungs every 6 (six) hours as needed for wheezing or shortness of breath., Disp: 8 g, Rfl: 3   amitriptyline (ELAVIL) 150 MG tablet, TAKE 1 TABLET(150 MG) BY MOUTH AT BEDTIME, Disp: 90 tablet, Rfl: 0   cholecalciferol (VITAMIN D3) 25 MCG (1000 UNIT) tablet, Take 1,000 Units by mouth daily., Disp: , Rfl:    diazepam (VALIUM) 10 MG tablet, Take 10 mg by mouth 2 (two) times daily., Disp: , Rfl:    fluticasone (FLONASE) 50 MCG/ACT nasal spray, Place 2 sprays into both nostrils daily., Disp: 16 g, Rfl: 6   gabapentin (NEURONTIN) 300 MG capsule, , Disp: , Rfl:  hydrOXYzine (VISTARIL) 25 MG capsule, TAKE 1 CAPSULE(25 MG) BY MOUTH EVERY 8 HOURS AS NEEDED, Disp: 60 capsule, Rfl: 0   lisinopril (ZESTRIL) 20 MG tablet, Take 20 mg by mouth daily., Disp: , Rfl:    magnesium 30 MG tablet, Take 30 mg by mouth 2 (two) times daily., Disp: , Rfl:    montelukast (SINGULAIR) 10 MG tablet, Take 10 mg by mouth daily., Disp: , Rfl:    Multiple Vitamin (MULTI-VITAMIN) tablet, Take 1 tablet by mouth daily., Disp: , Rfl:    nicotine (NICODERM CQ - DOSED IN MG/24 HOURS) 21 mg/24hr patch, Place 1 patch (21 mg total) onto the skin daily., Disp: 30 patch, Rfl: 0   oxybutynin (DITROPAN XL) 10 MG 24 hr tablet, Take 1 tablet (10 mg total) by mouth at bedtime., Disp: 30 tablet, Rfl: 1   tiotropium (SPIRIVA HANDIHALER) 18 MCG inhalation capsule, INL CONTENTS OF 1 C ONCE DAILY USING HANDIHALER AS DIRECTED, Disp: , Rfl:    Turmeric 500 MG CAPS, Take by mouth., Disp: , Rfl:     Assessment & Plan:

## 2021-04-02 ENCOUNTER — Encounter: Payer: Self-pay | Admitting: Physical Medicine and Rehabilitation

## 2021-04-03 ENCOUNTER — Telehealth: Payer: Self-pay | Admitting: Physical Medicine and Rehabilitation

## 2021-04-03 ENCOUNTER — Encounter: Payer: 59 | Admitting: Physical Medicine and Rehabilitation

## 2021-04-03 NOTE — Telephone Encounter (Signed)
Pt called needing to reschedule appt.  Please call pt at [phone]. ?

## 2021-04-05 ENCOUNTER — Ambulatory Visit: Payer: 59 | Admitting: Internal Medicine

## 2021-04-08 ENCOUNTER — Encounter: Payer: Self-pay | Admitting: Internal Medicine

## 2021-04-08 ENCOUNTER — Other Ambulatory Visit: Payer: Self-pay

## 2021-04-08 ENCOUNTER — Ambulatory Visit
Admission: RE | Admit: 2021-04-08 | Discharge: 2021-04-08 | Disposition: A | Discharge status: Home / Self Care | Payer: 59 | Attending: Internal Medicine | Admitting: Internal Medicine

## 2021-04-08 ENCOUNTER — Ambulatory Visit
Admission: RE | Admit: 2021-04-08 | Discharge: 2021-04-08 | Disposition: A | Discharge status: Home / Self Care | Payer: 59 | Source: Ambulatory Visit | Attending: Internal Medicine | Admitting: Internal Medicine

## 2021-04-08 ENCOUNTER — Telehealth (INDEPENDENT_AMBULATORY_CARE_PROVIDER_SITE_OTHER): Payer: 59 | Admitting: Internal Medicine

## 2021-04-08 DIAGNOSIS — J069 Acute upper respiratory infection, unspecified: Secondary | ICD-10-CM | POA: Insufficient documentation

## 2021-04-08 IMAGING — DX DG CHEST 2V
2 series · 2 of 2 positions shown · non-contrast
Comparison: Chest x-ray dated [DATE]

CLINICAL DATA: Shortness of breath

EXAM:
CHEST - 2 VIEW

[chest pa]
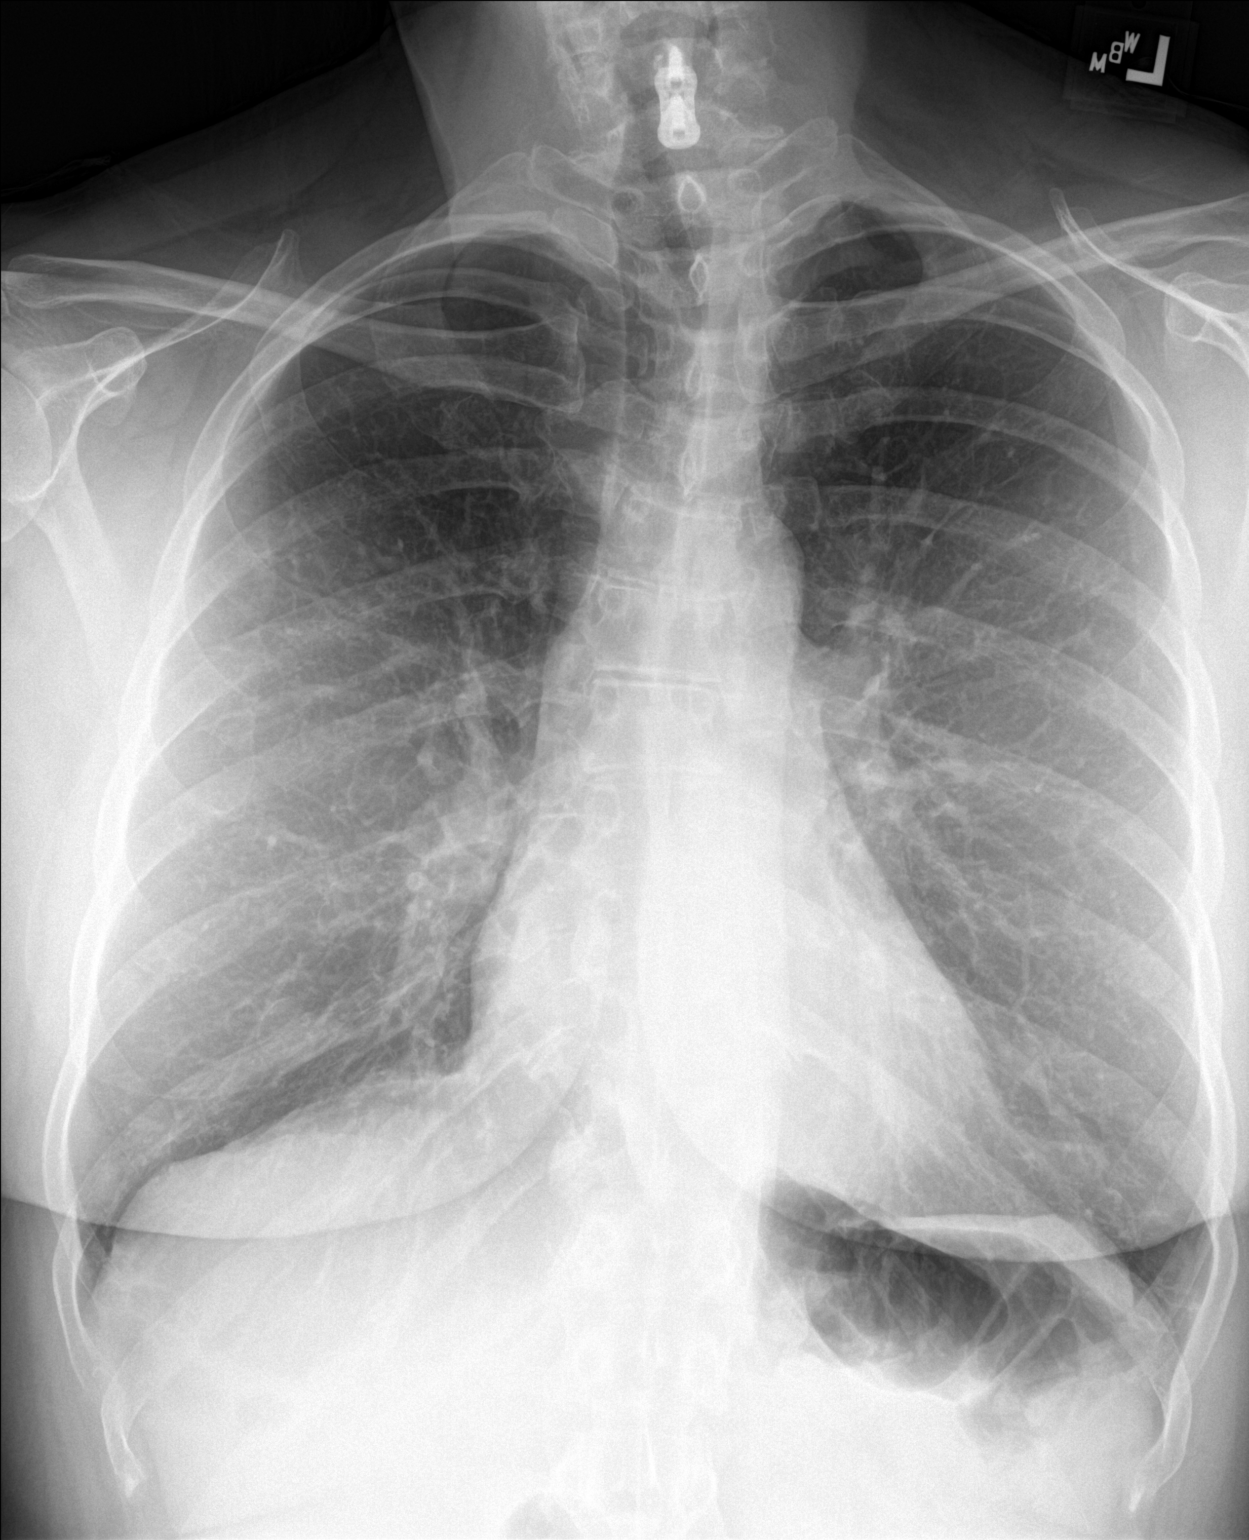

[chest lat]
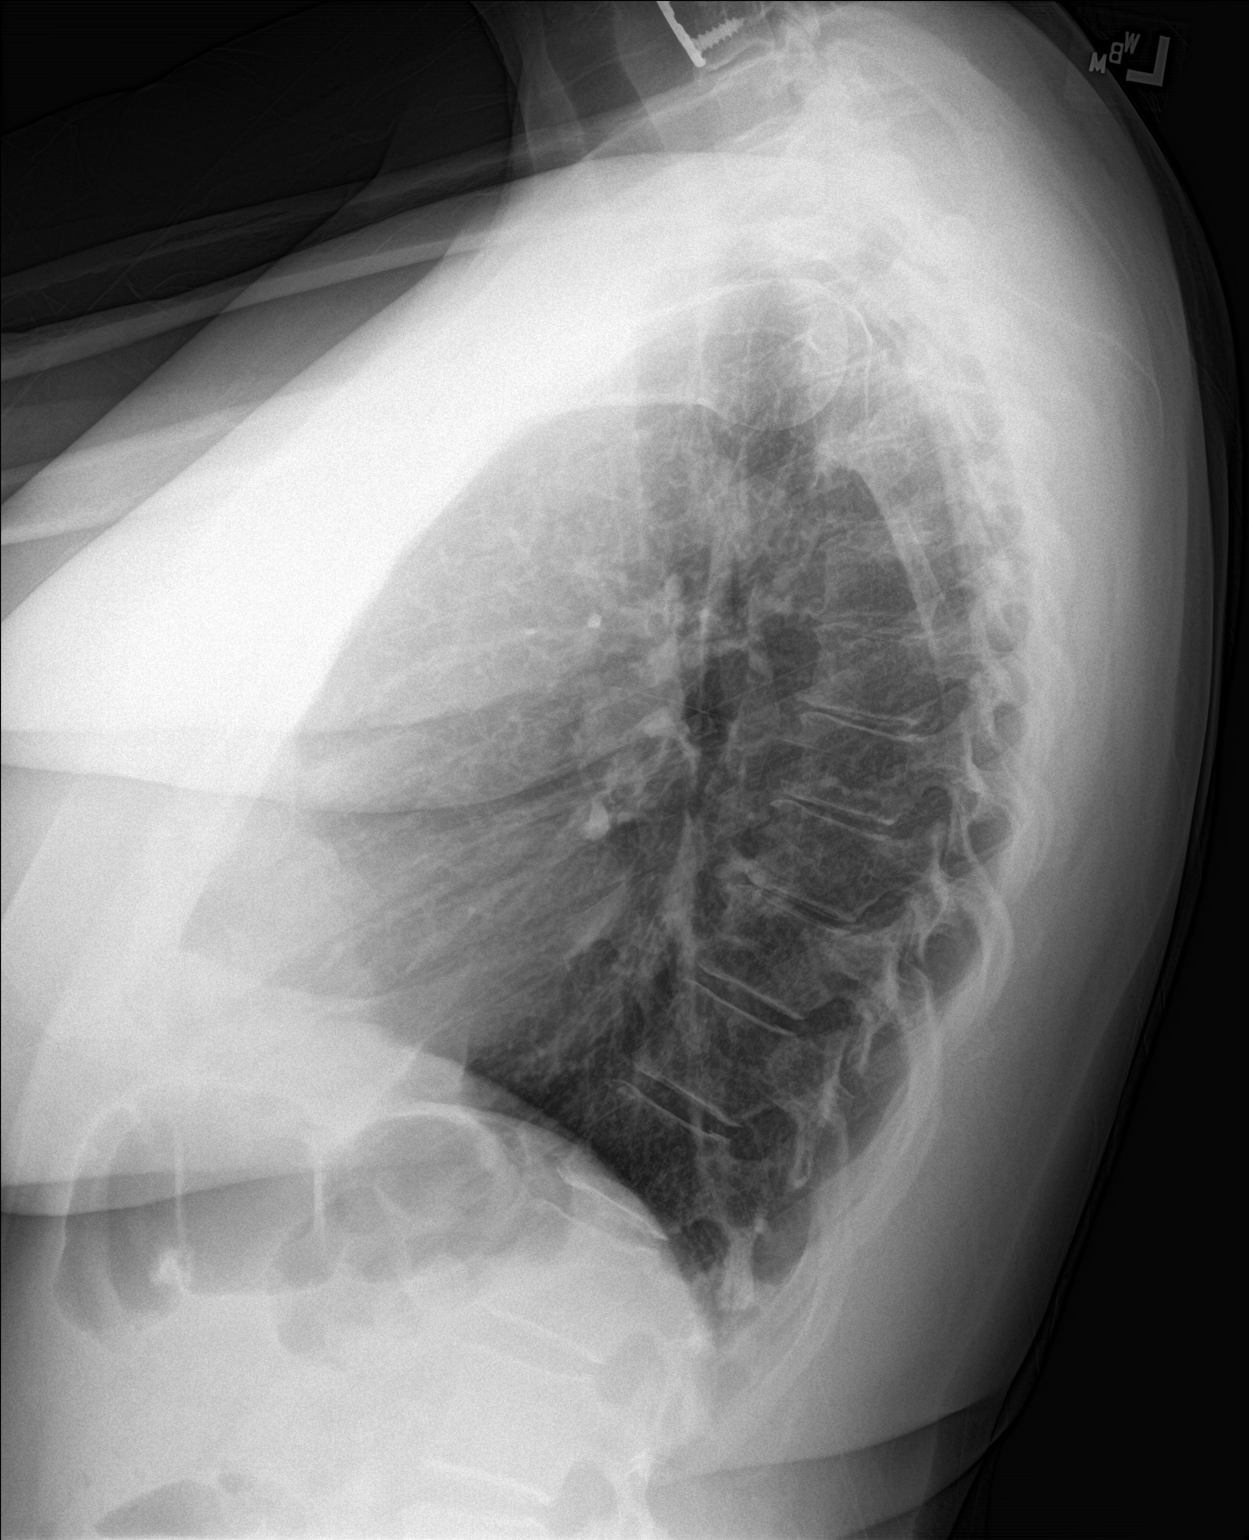

[2 of 2 positions shown; findings below may reference images not displayed]

FINDINGS: The heart size and mediastinal contours are within normal limits.
Both lungs are clear. The visualized skeletal structures are
unremarkable.
IMPRESSION: No active cardiopulmonary disease.

## 2021-04-08 MED ORDER — AZITHROMYCIN 250 MG PO TABS: ORAL_TABLET | ORAL | 0 refills | Status: AC

## 2021-04-08 MED ORDER — FEXOFENADINE HCL 180 MG PO TABS: 180.0000 mg | ORAL_TABLET | Freq: Every day | ORAL | 1 refills | Status: DC

## 2021-04-08 NOTE — Progress Notes (Addendum)
? ?There were no vitals taken for this visit.  ? ?Subjective:  ? ? Patient ID: Gwendolyn Hansen, female    DOB: 12-15-1970, 51 y.o.   MRN: [id number] ? ?Chief Complaint  ?Patient presents with  ? Sinusitis  ?  Seen on 2/28, patient was prescribed Augmentin and prednisone. Patient states that she is still feeling congested and not well ?Tested negative yesterday for covid at home yesterdya ?  ? ? ?HPI: ?Gwendolyn Hansen is a 51 y.o. female ? ? ?This visit was completed via telephone visit through MyChart due to the restrictions of the COVID-19 pandemic. All issues as above were discussed and addressed.t. If it was felt that the patient should be evaluated in the office, they were directed there. The patient verbally consented to this visit. ?Location of the patient: work ?Location of the provider: home ?Those involved with this call:  ?Provider: Charlynne Cousins, MD ?CMA: Frazier Butt, CMA ?Front Desk/Registration: [employer]  ?Time spent on call: 10 minutes on the phone discussing health concerns. 10 minutes total spent in review of patient's record and preparation of their chart. ? ?Patient presents with: ?Sinusitis: Seen on 2/28, patient was prescribed Augmentin and prednisone. Patient states that she is still feeling congested and not well ?Tested negative yesterday for covid at home yesterdya ? ?Went to [facility] and she has mold there. She will be moving her to her home x 2 weeks. ? ? ? ? ?Sinusitis ?Associated symptoms include congestion, coughing and ear pain. Pertinent negatives include no shortness of breath.  ?Sore Throat  ?This is a recurrent (ear ache presnt too took a home covid test yesterday -ve. has had a course of augmentin finished x  3 weeks ago) problem. The current episode started in the past 7 days. The problem has been gradually worsening. There has been no fever. Associated symptoms include congestion, coughing and ear pain. Pertinent negatives include no plugged ear sensation,  shortness of breath or stridor.  ? ?Chief Complaint  ?Patient presents with  ? Sinusitis  ?  Seen on 2/28, patient was prescribed Augmentin and prednisone. Patient states that she is still feeling congested and not well ?Tested negative yesterday for covid at home yesterdya ?  ? ? ?Relevant past medical, surgical, family and social history reviewed and updated as indicated. Interim medical history since our last visit reviewed. ?Allergies and medications reviewed and updated. ? ?Review of Systems  ?HENT:  Positive for congestion and ear pain.   ?Respiratory:  Positive for cough. Negative for shortness of breath and stridor.   ? ?Per HPI unless specifically indicated above ? ?   ?Objective:  ?  ?There were no vitals taken for this visit.  ?Wt Readings from Last 3 Encounters:  ?03/12/21 187 lb (84.8 kg)  ?03/12/21 187 lb 3.2 oz (84.9 kg)  ?02/27/21 181 lb 11.2 oz (82.4 kg)  ?  ?Physical Exam ? ?Results for orders placed or performed in visit on 03/12/21  ?Urinalysis, Routine w reflex microscopic  ?Result Value Ref Range  ? Specific Gravity, UA 1.010 1.005 - 1.030  ? pH, UA 6.0 5.0 - 7.5  ? Color, UA Yellow Yellow  ? Appearance Ur Clear Clear  ? Leukocytes,UA Negative Negative  ? Protein,UA Negative Negative/Trace  ? Glucose, UA Negative Negative  ? Ketones, UA Negative Negative  ? RBC, UA Negative Negative  ? Bilirubin, UA Negative Negative  ? Urobilinogen, Ur 0.2 0.2 - 1.0 mg/dL  ? Nitrite, UA Negative Negative  ?CBC  with Differential/Platelet  ?Result Value Ref Range  ? WBC 9.6 3.4 - 10.8 x10E3/uL  ? RBC 4.62 3.77 - 5.28 x10E6/uL  ? Hemoglobin 14.1 11.1 - 15.9 g/dL  ? Hematocrit 42.6 34.0 - 46.6 %  ? MCV 92 79 - 97 fL  ? MCH 30.5 26.6 - 33.0 pg  ? MCHC 33.1 31.5 - 35.7 g/dL  ? RDW 13.8 11.7 - 15.4 %  ? Platelets 329 150 - 450 x10E3/uL  ? Neutrophils 79 Not Estab. %  ? Lymphs 15 Not Estab. %  ? Monocytes 5 Not Estab. %  ? Eos 1 Not Estab. %  ? Basos 0 Not Estab. %  ? Neutrophils Absolute 7.5 (H) 1.4 - 7.0 x10E3/uL  ?  Lymphocytes Absolute 1.5 0.7 - 3.1 x10E3/uL  ? Monocytes Absolute 0.5 0.1 - 0.9 x10E3/uL  ? EOS (ABSOLUTE) 0.1 0.0 - 0.4 x10E3/uL  ? Basophils Absolute 0.0 0.0 - 0.2 x10E3/uL  ? Immature Granulocytes 0 Not Estab. %  ? Immature Grans (Abs) 0.0 0.0 - 0.1 x10E3/uL  ?Comprehensive metabolic panel  ?Result Value Ref Range  ? Glucose 96 70 - 99 mg/dL  ? BUN 19 6 - 24 mg/dL  ? Creatinine, Ser 1.09 (H) 0.57 - 1.00 mg/dL  ? eGFR 61 >59 mL/min/1.73  ? BUN/Creatinine Ratio 17 9 - 23  ? Sodium 138 134 - 144 mmol/L  ? Potassium 4.6 3.5 - 5.2 mmol/L  ? Chloride 100 96 - 106 mmol/L  ? CO2 27 20 - 29 mmol/L  ? Calcium 10.2 8.7 - 10.2 mg/dL  ? Total Protein 6.5 6.0 - 8.5 g/dL  ? Albumin 4.8 3.8 - 4.9 g/dL  ? Globulin, Total 1.7 1.5 - 4.5 g/dL  ? Albumin/Globulin Ratio 2.8 (H) 1.2 - 2.2  ? Bilirubin Total <0.2 0.0 - 1.2 mg/dL  ? Alkaline Phosphatase 51 44 - 121 IU/L  ? AST 44 (H) 0 - 40 IU/L  ? ALT 68 (H) 0 - 32 IU/L  ?Lipid panel  ?Result Value Ref Range  ? Cholesterol, Total 260 (H) 100 - 199 mg/dL  ? Triglycerides 113 0 - 149 mg/dL  ? HDL 66 >39 mg/dL  ? VLDL Cholesterol Cal 20 5 - 40 mg/dL  ? LDL Chol Calc (NIH) 174 (H) 0 - 99 mg/dL  ? Chol/HDL Ratio 3.9 0.0 - 4.4 ratio  ? ?   ? ? ?Current Outpatient Medications:  ?  albuterol (PROVENTIL) (2.5 MG/3ML) 0.083% nebulizer solution, Take 3 mLs (2.5 mg total) by nebulization every 4 (four) hours as needed., Disp: 75 mL, Rfl: 12 ?  albuterol (VENTOLIN HFA) 108 (90 Base) MCG/ACT inhaler, Inhale 2 puffs into the lungs every 6 (six) hours as needed for wheezing or shortness of breath., Disp: 8 g, Rfl: 3 ?  amitriptyline (ELAVIL) 150 MG tablet, TAKE 1 TABLET(150 MG) BY MOUTH AT BEDTIME, Disp: 90 tablet, Rfl: 0 ?  atorvastatin (LIPITOR) 20 MG tablet, Take 1 tablet (20 mg total) by mouth daily., Disp: 90 tablet, Rfl: 3 ?  azithromycin (ZITHROMAX) 250 MG tablet, Take 2 tablets on day 1, then 1 tablet daily on days 2 through 5, Disp: 6 tablet, Rfl: 0 ?  cholecalciferol (VITAMIN D3) 25 MCG (1000  UNIT) tablet, Take 1,000 Units by mouth daily., Disp: , Rfl:  ?  diazepam (VALIUM) 10 MG tablet, Take 10 mg by mouth 2 (two) times daily., Disp: , Rfl:  ?  fexofenadine (ALLEGRA ALLERGY) 180 MG tablet, Take 1 tablet (180 mg total) by mouth daily.,  Disp: 10 tablet, Rfl: 1 ?  fluticasone (FLONASE) 50 MCG/ACT nasal spray, Place 2 sprays into both nostrils daily., Disp: 16 g, Rfl: 6 ?  gabapentin (NEURONTIN) 300 MG capsule, , Disp: , Rfl:  ?  hydrOXYzine (VISTARIL) 25 MG capsule, TAKE 1 CAPSULE(25 MG) BY MOUTH EVERY 8 HOURS AS NEEDED, Disp: 60 capsule, Rfl: 0 ?  lisinopril (ZESTRIL) 20 MG tablet, Take 20 mg by mouth daily., Disp: , Rfl:  ?  magnesium 30 MG tablet, Take 30 mg by mouth 2 (two) times daily., Disp: , Rfl:  ?  montelukast (SINGULAIR) 10 MG tablet, Take 10 mg by mouth daily., Disp: , Rfl:  ?  Multiple Vitamin (MULTI-VITAMIN) tablet, Take 1 tablet by mouth daily., Disp: , Rfl:  ?  nicotine (NICODERM CQ - DOSED IN MG/24 HOURS) 21 mg/24hr patch, Place 1 patch (21 mg total) onto the skin daily., Disp: 30 patch, Rfl: 0 ?  oxybutynin (DITROPAN XL) 10 MG 24 hr tablet, Take 1 tablet (10 mg total) by mouth at bedtime., Disp: 30 tablet, Rfl: 1 ?  Turmeric 500 MG CAPS, Take by mouth., Disp: , Rfl:  ?  tiotropium (SPIRIVA HANDIHALER) 18 MCG inhalation capsule, INL CONTENTS OF 1 C ONCE DAILY USING HANDIHALER AS DIRECTED, Disp: , Rfl:   ? ? ?Assessment & Plan:  ?URI: zpak sent in to pharmacy sec to recurrence. ?Flu and strep ordered at this visit, both negative. pt advised to take Tylenol q 4- 6 hourly as needed. pt to take allegra q pm as needed and to call office if symptoms worsened pt verbalised understanding of such ? ? ?Problem List Items Addressed This Visit   ?None ?Visit Diagnoses   ? ? Upper respiratory tract infection, unspecified type    -  Primary  ? Relevant Medications  ? azithromycin (ZITHROMAX) 250 MG tablet  ? Other Relevant Orders  ? DG Chest 2 View  ? ?  ?  ? ?Orders Placed This Encounter  ?Procedures   ? DG Chest 2 View  ?  ? ?Meds ordered this encounter  ?Medications  ? azithromycin (ZITHROMAX) 250 MG tablet  ?  Sig: Take 2 tablets on day 1, then 1 tablet daily on days 2 through 5  ?  Dispense:  6 table

## 2021-04-09 NOTE — Progress Notes (Signed)
Please let pt know this was normal.

## 2021-04-16 ENCOUNTER — Encounter: Payer: 59 | Admitting: Physical Medicine and Rehabilitation

## 2021-04-19 ENCOUNTER — Telehealth: Payer: Self-pay | Admitting: Physical Medicine and Rehabilitation

## 2021-04-19 ENCOUNTER — Encounter: Payer: 59 | Admitting: Physical Medicine and Rehabilitation

## 2021-04-19 NOTE — Telephone Encounter (Signed)
Patient called needing to reschedule her to appointment. Patient can not get her garage door up. The number to contact patient is [phone] ?

## 2021-05-08 ENCOUNTER — Ambulatory Visit: Payer: 59 | Admitting: Orthopaedic Surgery

## 2021-05-08 ENCOUNTER — Ambulatory Visit (INDEPENDENT_AMBULATORY_CARE_PROVIDER_SITE_OTHER): Payer: 59 | Admitting: Physical Medicine and Rehabilitation

## 2021-05-08 ENCOUNTER — Encounter: Payer: Self-pay | Admitting: Physical Medicine and Rehabilitation

## 2021-05-08 DIAGNOSIS — R202 Paresthesia of skin: Secondary | ICD-10-CM | POA: Diagnosis not present

## 2021-05-08 NOTE — Progress Notes (Signed)
Pt state right hand weakness and pressure. Pt state she has trouble holding items and keeps dropping them. Pt state numbness and tingling in her fingers. Pt state she rubs her hand and takes over the counter pain meds to help ease her pain. Pt state she right haned. ? ?Numeric Pain Rating Scale and Functional Assessment ?Average Pain 4 ? ? ?In the last MONTH (on 0-10 scale) has pain interfered with the following? ? ?1. General activity like being  able to carry out your everyday physical activities such as walking, climbing stairs, carrying groceries, or moving a chair?  ?Rating(83) ? ? ? ? ?

## 2021-05-09 ENCOUNTER — Other Ambulatory Visit: Payer: Self-pay

## 2021-05-09 ENCOUNTER — Telehealth: Payer: Self-pay | Admitting: Internal Medicine

## 2021-05-09 ENCOUNTER — Encounter: Payer: Self-pay | Admitting: Internal Medicine

## 2021-05-09 ENCOUNTER — Telehealth: Payer: Self-pay

## 2021-05-09 DIAGNOSIS — Z1231 Encounter for screening mammogram for malignant neoplasm of breast: Secondary | ICD-10-CM

## 2021-05-09 DIAGNOSIS — Z124 Encounter for screening for malignant neoplasm of cervix: Secondary | ICD-10-CM

## 2021-05-09 NOTE — Telephone Encounter (Signed)
Copied from CRM [id number]. Topic: Referral - Request for Referral ?>> May 09, 2021  3:39 PM Randol Kern wrote: ?Has patient seen PCP for this complaint? Yes.   ?*If NO, is insurance requiring patient see PCP for this issue before PCP can refer them? ?Referral for which specialty: Mammography ?Preferred provider/office: Highest recommended for insurance  ?Reason for referral: Due for mammogram ?

## 2021-05-10 NOTE — Telephone Encounter (Signed)
Need for pap ?

## 2021-05-13 NOTE — Progress Notes (Signed)
? ?Gwendolyn Hansen - 51 y.o. female MRN [id number]  Date of birth: October 16, 1970 ? ?Office Visit Note: ?Visit Date: 05/08/2021 ?PCP: Loura Pardon, MD ?Referred by: Eldred Manges, MD ? ?Subjective: ?Chief Complaint  ?Patient presents with  ? Right Hand - Pain  ? ?HPI:  Gwendolyn Hansen is a 51 y.o. female who comes in today at the request of Dr. Annell Greening for electrodiagnostic study of the Right upper extremities.  Patient is Right hand dominant.  She reports chronic worsening right hand weakness with trouble holding objects and dropping objects.  She also reports nondermatomal numbness and tingling in all of the fingers.  She denies any left-sided complaints.  No frank radicular symptoms.  No prior electrodiagnostic studies.  She does have history of prior remote cervical fusion.  Updated MRI. Cervical MRI 03/08/2021 showed C5-6 fusion with mild neuroforaminal stenosis.  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  ? ?ROS Otherwise per HPI. ? ?Assessment & Plan: ?Visit Diagnoses:  ?  ICD-10-CM   ?1. Paresthesia of skin  R20.2 NCV with EMG (electromyography)  ?  ?  ?Plan: Impression: ?Essentially NORMAL electrodiagnostic study of the right upper limb.  There is no significant electrodiagnostic evidence of nerve entrapment, brachial plexopathy or cervical radiculopathy.   ? ?As you know, purely sensory or demyelinating radiculopathies and chemical radiculitis may not be detected with this particular electrodiagnostic study. **This electrodiagnostic study cannot rule out small fiber polyneuropathy and dysesthesias from central pain syndromes such as stroke or central pain sensitization syndromes such as fibromyalgia.  Myotomal referral pain from trigger points is also not excluded. ? ?Recommendations: ?1.  Follow-up with referring physician. ?2.  Continue current management of symptoms. ? ?Meds & Orders: No orders of the defined types were placed in this encounter. ?  ?Orders Placed This Encounter   ?Procedures  ? NCV with EMG (electromyography)  ?  ?Follow-up: Return in about 2 weeks (around 05/22/2021) for Annell Greening, MD.  ? ?Procedures: ?No procedures performed  ?EMG & NCV Findings: ?All nerve conduction studies (as indicated in the following tables) were within normal limits.   ? ?All examined muscles (as indicated in the following table) showed no evidence of electrical instability.   ? ?Impression: ?Essentially NORMAL electrodiagnostic study of the right upper limb.  There is no significant electrodiagnostic evidence of nerve entrapment, brachial plexopathy or cervical radiculopathy.   ? ?As you know, purely sensory or demyelinating radiculopathies and chemical radiculitis may not be detected with this particular electrodiagnostic study. **This electrodiagnostic study cannot rule out small fiber polyneuropathy and dysesthesias from central pain syndromes such as stroke or central pain sensitization syndromes such as fibromyalgia.  Myotomal referral pain from trigger points is also not excluded. ? ?Recommendations: ?1.  Follow-up with referring physician. ?2.  Continue current management of symptoms. ? ?___________________________ ?Naaman Plummer FAAPMR ?Board Certified, Biomedical engineer of Physical Medicine and Rehabilitation ? ? ? ?Nerve Conduction Studies ?Anti Sensory Summary Table ? ? Stim Site NR Peak (ms) Norm Peak (ms) P-T Amp (?V) Norm P-T Amp Site1 Site2 Delta-P (ms) Dist (cm) Vel (m/s) Norm Vel (m/s)  ?Right Median Acr Palm Anti Sensory (2nd Digit)  31.2?C  ?Wrist    3.4 <3.6 23.7 >10 Wrist Palm 1.5 0.0    ?Palm    1.9 <2.0 31.4         ?Right Radial Anti Sensory (Base 1st Digit)  31?C  ?Wrist    2.5 <3.1 26.0  Wrist Base 1st Digit 2.5 0.0    ?  Right Ulnar Anti Sensory (5th Digit)  31.4?C  ?Wrist    3.4 <3.7 15.3 >15.0 Wrist 5th Digit 3.4 14.0 41 >38  ? ?Motor Summary Table ? ? Stim Site NR Onset (ms) Norm Onset (ms) O-P Amp (mV) Norm O-P Amp Site1 Site2 Delta-0 (ms) Dist (cm) Vel (m/s) Norm Vel (m/s)   ?Right Median Motor (Abd Poll Brev)  31.5?C  ?Wrist    3.0 <4.2 9.6 >5 Elbow Wrist 4.0 21.5 54 >50  ?Elbow    7.0  5.4         ?Right Ulnar Motor (Abd Dig Min)  31.6?C  ?Wrist    3.0 <4.2 10.8 >3 B Elbow Wrist 3.6 20.5 57 >53  ?B Elbow    6.6  10.6  A Elbow B Elbow 1.1 10.0 91 >53  ?A Elbow    7.7  10.3         ? ?EMG ? ? Side Muscle Nerve Root Ins Act Fibs Psw Amp Dur Poly Recrt Int Dennie Bible Comment  ?Right Abd Poll Brev Median C8-T1 Nml Nml Nml Nml Nml 0 Nml Nml   ?Right 1stDorInt Ulnar C8-T1 Nml Nml Nml Nml Nml 0 Nml Nml   ?Right PronatorTeres Median C6-7 Nml Nml Nml Nml Nml 0 Nml Nml   ?Right Biceps Musculocut C5-6 Nml Nml Nml Nml Nml 0 Nml Nml   ?Right Deltoid Axillary C5-6 Nml Nml Nml Nml Nml 0 Nml Nml   ? ? ?Nerve Conduction Studies ?Anti Sensory Left/Right Comparison ? ? Stim Site L Lat (ms) R Lat (ms) L-R Lat (ms) L Amp (?V) R Amp (?V) L-R Amp (%) Site1 Site2 L Vel (m/s) R Vel (m/s) L-R Vel (m/s)  ?Median Acr Palm Anti Sensory (2nd Digit)  31.2?C  ?Wrist  3.4   23.7  Wrist Palm     ?Palm  1.9   31.4        ?Radial Anti Sensory (Base 1st Digit)  31?C  ?Wrist  2.5   26.0  Wrist Base 1st Digit     ?Ulnar Anti Sensory (5th Digit)  31.4?C  ?Wrist  3.4   15.3  Wrist 5th Digit  41   ? ?Motor Left/Right Comparison ? ? Stim Site L Lat (ms) R Lat (ms) L-R Lat (ms) L Amp (mV) R Amp (mV) L-R Amp (%) Site1 Site2 L Vel (m/s) R Vel (m/s) L-R Vel (m/s)  ?Median Motor (Abd Poll Brev)  31.5?C  ?Wrist  3.0   9.6  Elbow Wrist  54   ?Elbow  7.0   5.4        ?Ulnar Motor (Abd Dig Min)  31.6?C  ?Wrist  3.0   10.8  B Elbow Wrist  57   ?B Elbow  6.6   10.6  A Elbow B Elbow  91   ?A Elbow  7.7   10.3        ? ? ? ?Waveforms: ?    ? ?   ? ?  ? ?Clinical History: ?No specialty comments available.  ? ? ? ?Objective:  VS:  HT:    WT:   BMI:     BP:   HR: bpm  TEMP: ( )  RESP:  ?Physical Exam ?Musculoskeletal:     ?   General: No swelling, tenderness or deformity.  ?   Comments: Inspection reveals no atrophy of the bilateral APB or FDI  or hand intrinsics. There is no swelling, color changes, allodynia or dystrophic changes. There is 5 out  of 5 strength in the bilateral wrist extension, finger abduction and long finger flexion. There is intact sensation to light touch in all dermatomal and peripheral nerve distributions. There is a negative Hoffmann's test bilaterally.  ?Skin: ?   General: Skin is warm and dry.  ?   Findings: No erythema or rash.  ?Neurological:  ?   General: No focal deficit present.  ?   Mental Status: She is alert and oriented to person, place, and time.  ?   Motor: No weakness or abnormal muscle tone.  ?   Coordination: Coordination normal.  ?Psychiatric:     ?   Mood and Affect: Mood normal.     ?   Behavior: Behavior normal.  ?  ? ?Imaging: ?No results found. ?

## 2021-05-13 NOTE — Procedures (Signed)
EMG & NCV Findings: ?All nerve conduction studies (as indicated in the following tables) were within normal limits.   ? ?All examined muscles (as indicated in the following table) showed no evidence of electrical instability.   ? ?Impression: ?Essentially NORMAL electrodiagnostic study of the right upper limb.  There is no significant electrodiagnostic evidence of nerve entrapment, brachial plexopathy or cervical radiculopathy.   ? ?As you know, purely sensory or demyelinating radiculopathies and chemical radiculitis may not be detected with this particular electrodiagnostic study. **This electrodiagnostic study cannot rule out small fiber polyneuropathy and dysesthesias from central pain syndromes such as stroke or central pain sensitization syndromes such as fibromyalgia.  Myotomal referral pain from trigger points is also not excluded. ? ?Recommendations: ?1.  Follow-up with referring physician. ?2.  Continue current management of symptoms. ? ?___________________________ ?Gwendolyn Hansen FAAPMR ?Board Certified, Biomedical engineer of Physical Medicine and Rehabilitation ? ? ? ?Nerve Conduction Studies ?Anti Sensory Summary Table ? ? Stim Site NR Peak (ms) Norm Peak (ms) P-T Amp (?V) Norm P-T Amp Site1 Site2 Delta-P (ms) Dist (cm) Vel (m/s) Norm Vel (m/s)  ?Right Median Acr Palm Anti Sensory (2nd Digit)  31.2?C  ?Wrist    3.4 <3.6 23.7 >10 Wrist Palm 1.5 0.0    ?Palm    1.9 <2.0 31.4         ?Right Radial Anti Sensory (Base 1st Digit)  31?C  ?Wrist    2.5 <3.1 26.0  Wrist Base 1st Digit 2.5 0.0    ?Right Ulnar Anti Sensory (5th Digit)  31.4?C  ?Wrist    3.4 <3.7 15.3 >15.0 Wrist 5th Digit 3.4 14.0 41 >38  ? ?Motor Summary Table ? ? Stim Site NR Onset (ms) Norm Onset (ms) O-P Amp (mV) Norm O-P Amp Site1 Site2 Delta-0 (ms) Dist (cm) Vel (m/s) Norm Vel (m/s)  ?Right Median Motor (Abd Poll Brev)  31.5?C  ?Wrist    3.0 <4.2 9.6 >5 Elbow Wrist 4.0 21.5 54 >50  ?Elbow    7.0  5.4         ?Right Ulnar Motor (Abd Dig Min)  31.6?C   ?Wrist    3.0 <4.2 10.8 >3 B Elbow Wrist 3.6 20.5 57 >53  ?B Elbow    6.6  10.6  A Elbow B Elbow 1.1 10.0 91 >53  ?A Elbow    7.7  10.3         ? ?EMG ? ? Side Muscle Nerve Root Ins Act Fibs Psw Amp Dur Poly Recrt Int Dennie Bible Comment  ?Right Abd Poll Brev Median C8-T1 Nml Nml Nml Nml Nml 0 Nml Nml   ?Right 1stDorInt Ulnar C8-T1 Nml Nml Nml Nml Nml 0 Nml Nml   ?Right PronatorTeres Median C6-7 Nml Nml Nml Nml Nml 0 Nml Nml   ?Right Biceps Musculocut C5-6 Nml Nml Nml Nml Nml 0 Nml Nml   ?Right Deltoid Axillary C5-6 Nml Nml Nml Nml Nml 0 Nml Nml   ? ? ?Nerve Conduction Studies ?Anti Sensory Left/Right Comparison ? ? Stim Site L Lat (ms) R Lat (ms) L-R Lat (ms) L Amp (?V) R Amp (?V) L-R Amp (%) Site1 Site2 L Vel (m/s) R Vel (m/s) L-R Vel (m/s)  ?Median Acr Palm Anti Sensory (2nd Digit)  31.2?C  ?Wrist  3.4   23.7  Wrist Palm     ?Palm  1.9   31.4        ?Radial Anti Sensory (Base 1st Digit)  31?C  ?Wrist  2.5  26.0  Wrist Base 1st Digit     ?Ulnar Anti Sensory (5th Digit)  31.4?C  ?Wrist  3.4   15.3  Wrist 5th Digit  41   ? ?Motor Left/Right Comparison ? ? Stim Site L Lat (ms) R Lat (ms) L-R Lat (ms) L Amp (mV) R Amp (mV) L-R Amp (%) Site1 Site2 L Vel (m/s) R Vel (m/s) L-R Vel (m/s)  ?Median Motor (Abd Poll Brev)  31.5?C  ?Wrist  3.0   9.6  Elbow Wrist  54   ?Elbow  7.0   5.4        ?Ulnar Motor (Abd Dig Min)  31.6?C  ?Wrist  3.0   10.8  B Elbow Wrist  57   ?B Elbow  6.6   10.6  A Elbow B Elbow  91   ?A Elbow  7.7   10.3        ? ? ? ?Waveforms: ?    ? ?   ? ? ?

## 2021-05-14 ENCOUNTER — Ambulatory Visit: Payer: 59 | Admitting: Orthopaedic Surgery

## 2021-05-17 ENCOUNTER — Encounter: Payer: Self-pay | Admitting: Obstetrics and Gynecology

## 2021-05-22 ENCOUNTER — Ambulatory Visit (INDEPENDENT_AMBULATORY_CARE_PROVIDER_SITE_OTHER): Payer: 59

## 2021-05-22 ENCOUNTER — Ambulatory Visit: Payer: 59 | Admitting: Orthopaedic Surgery

## 2021-05-22 ENCOUNTER — Encounter: Payer: Self-pay | Admitting: Orthopaedic Surgery

## 2021-05-22 VITALS — BP 105/74 | HR 73 | Ht 70.5 in | Wt 189.0 lb

## 2021-05-22 DIAGNOSIS — M51369 Other intervertebral disc degeneration, lumbar region without mention of lumbar back pain or lower extremity pain: Secondary | ICD-10-CM

## 2021-05-22 DIAGNOSIS — G8929 Other chronic pain: Secondary | ICD-10-CM | POA: Diagnosis not present

## 2021-05-22 DIAGNOSIS — M5136 Other intervertebral disc degeneration, lumbar region: Secondary | ICD-10-CM

## 2021-05-22 DIAGNOSIS — M545 Low back pain, unspecified: Secondary | ICD-10-CM | POA: Diagnosis not present

## 2021-05-22 NOTE — Progress Notes (Addendum)
Office Visit Note   Patient: Gwendolyn Hansen           Date of Birth: 06-Jan-1971           MRN: [id number] Visit Date: 05/22/2021              Requested by: Loura Pardon, MD [address] [place],  [place] [address] PCP: Loura Pardon, MD   Assessment & Plan: Visit Diagnoses:  1. Chronic right-sided low back pain, unspecified whether sciatica present   2. Other intervertebral disc degeneration, lumbar region     Plan: We will set patient up for some physical therapy for treatment of her lumbar disc degeneration at L4-5.  She does not have any claudication symptoms and no focal weakness and no nerve root tension signs currently.  Symptoms been present for several years.  Results of her EMGs nerve conduction velocities UE were negative were reviewed with her.    Follow-Up Instructions: Return in about 2 months (around 07/22/2021).   Orders:  Orders Placed This Encounter  Procedures   XR Lumbar Spine 2-3 Views   Ambulatory referral to Physical Therapy   No orders of the defined types were placed in this encounter.     Procedures: No procedures performed   Clinical Data: No additional findings.   Subjective: Chief Complaint  Patient presents with   Right Leg - Pain   Right Hand - Follow-up    EMG/NCV review    HPI 51 year old female here with several year history of back and leg pain more pain down the right leg than left radiates down to her big toe she has trouble trying to sleep on her left side.  She has taken Tylenol arthritis without relief.  She had recent electrical test with hand numbness problems and electrical tests are negative for evidence of carpal tunnel syndrome.  History of substance abuse patient is currently on Valium and she has been  on buprenorphine patches in the past.   Review of Systems no bowel bladder symptoms no fever or chills.   Objective: Vital Signs: BP 105/74   Pulse 73   Ht 5' 10.5" (1.791 m)   Wt 189 lb (85.7 kg)   BMI 26.74 kg/m    Physical Exam Constitutional:      Appearance: She is well-developed.  HENT:     Head: Normocephalic.     Right Ear: External ear normal.     Left Ear: External ear normal. There is no impacted cerumen.  Eyes:     Pupils: Pupils are equal, round, and reactive to light.  Neck:     Thyroid: No thyromegaly.     Trachea: No tracheal deviation.  Cardiovascular:     Rate and Rhythm: Normal rate.  Pulmonary:     Effort: Pulmonary effort is normal.  Abdominal:     Palpations: Abdomen is soft.  Musculoskeletal:     Cervical back: No rigidity.  Skin:    General: Skin is warm and dry.  Neurological:     Mental Status: She is alert and oriented to person, place, and time.  Psychiatric:        Behavior: Behavior normal.    Ortho Exam patient has some sciatic notch tenderness worse on the right than left trochanteric tenderness slightly worse right than left.  Negative logroll no pain with hip flexion knee and ankle jerk are intact negative straight leg raising 90 degrees.  Specialty Comments:  No specialty comments available.  Imaging: XR Lumbar  Spine 2-3 Views  Result Date: 05/22/2021 AP lateral lumbar spine x-rays demonstrate disc space narrowing L4-5 50% with endplate irregularity and marginal osteophytes at the endplate.  No spondylolisthesis. Impression: Lumbar disc degeneration L4-5.    PMFS History: Patient Active Problem List   Diagnosis Date Noted   Other intervertebral disc degeneration, lumbar region 05/22/2021   Upper respiratory tract infection 04/08/2021   Wheezing 03/19/2021   Acute non-recurrent sinusitis 03/19/2021   Numbness of right hand 03/18/2021   Screening for lipid disorders 03/12/2021   Urinary urgency 03/12/2021   Absence of bladder continence 03/12/2021   Bilateral hand numbness 02/27/2021   Recurrent sinus infections 12/10/2020   Disorientation 12/10/2020   COPD GOLD 2/ active smoker  11/13/2020   Sinusitis 10/23/2020   Cough 10/04/2020    Anxiety 10/04/2020   Encounter for screening mammogram for malignant neoplasm of breast 10/04/2020   Cigarette smoker 10/04/2020   Screening for colon cancer 10/04/2020   Need for influenza vaccination 10/04/2020   Brief psychotic disorder (HCC) 08/24/2018   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   Drug psychosis, 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/17/2015   Amphetamine use disorder, 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   Tobacco use disorder 08/18/2014   Neck pain 08/17/2014   Ankle pain 08/17/2014   GAD (generalized anxiety disorder) 08/17/2014   Depression 08/17/2014   Opioid use disorder, severe, in controlled environment, dependence (HCC) 08/17/2014   History of alcohol dependence (HCC) 08/17/2014   Past Medical History:  Diagnosis Date   Allergy    Anxiety    Asthma     Family History  Problem Relation Age of Onset   COPD Mother    Anxiety disorder Mother    Emphysema Father    Alcohol abuse Father    Anxiety disorder Father    Leukemia Father    Anxiety disorder Sister    Bipolar disorder Sister    Colon cancer Maternal Grandmother     Past Surgical History:  Procedure Laterality Date   ANKLE SURGERY Right 1999   BREAST ENHANCEMENT SURGERY     NECK SURGERY  2011   Social History   Occupational History   Not on file  Tobacco Use   Smoking status: Every Day    Packs/day: 2.00    Years: 28.00    Pack years: 56.00    Types: Cigarettes   Smokeless tobacco: Never   Tobacco comments:    1ppd-11/13/2020  Vaping Use   Vaping Use: Some days   Substances: Nicotine, Flavoring  Substance and Sexual Activity   Alcohol use: No    Alcohol/week: 0.0 standard drinks    Comment: quit drinking in 2010   Drug use: No   Sexual activity: Yes

## 2021-05-23 ENCOUNTER — Other Ambulatory Visit: Payer: Self-pay | Admitting: Internal Medicine

## 2021-05-24 NOTE — Telephone Encounter (Signed)
Requested Prescriptions  ?Pending Prescriptions Disp Refills  ?? oxybutynin (DITROPAN-XL) 10 MG 24 hr tablet [Pharmacy Med Name: OXYBUTYNIN ER 10MG  TABLETS] 90 tablet 0  ?  Sig: TAKE 1 TABLET(10 MG) BY MOUTH AT BEDTIME  ?  ? Urology:  Bladder Agents Passed - 05/23/2021  8:30 PM  ?  ?  Passed - Valid encounter within last 12 months  ?  Recent Outpatient Visits   ?      ? 1 month ago Upper respiratory tract infection, unspecified type  ? [facility] [name], [name], MD  ? 2 months ago Tobacco use disorder  ? [facility] [name], [name], MD  ? 2 months ago GAD (generalized anxiety disorder)  ? [facility] [name], [name], MD  ? 5 months ago Recurrent sinus infections  ? [facility] [name], [name], MD  ? 6 months ago Neck pain  ? [facility] [name], [name], MD  ?  ?  ?Future Appointments   ?        ? In 2 months [facility], MD [facility]  ?  ? ?  ?  ?  ? ? ?

## 2021-05-28 ENCOUNTER — Other Ambulatory Visit: Payer: Self-pay | Admitting: Internal Medicine

## 2021-05-29 ENCOUNTER — Other Ambulatory Visit: Payer: Self-pay | Admitting: Internal Medicine

## 2021-05-29 NOTE — Telephone Encounter (Signed)
Requested Prescriptions  ?Pending Prescriptions Disp Refills  ?? nicotine (NICODERM CQ - DOSED IN MG/24 HOURS) 21 mg/24hr patch [Pharmacy Med Name: NICOTINE 21MG /24H PATCH 28S] 28 patch   ?  Sig: APPLY 1 PATCH(21 MG) TOPICALLY TO THE SKIN DAILY  ?  ? Psychiatry:  Drug Dependence Therapy Passed - 05/29/2021 11:46 AM  ?  ?  Passed - Valid encounter within last 12 months  ?  Recent Outpatient Visits   ?      ? 1 month ago Upper respiratory tract infection, unspecified type  ? [facility] [name], [name], MD  ? 2 months ago Tobacco use disorder  ? [facility] [name], [name], MD  ? 2 months ago GAD (generalized anxiety disorder)  ? [facility] [name], [name], MD  ? 5 months ago Recurrent sinus infections  ? [place], [place]  ? 6 months ago Neck pain  ? [facility] [name], [name], MD  ?  ?  ?Future Appointments   ?        ? In 1 month Marybelle Killings, MD [place]  ?  ? ?  ?  ?  ? ? ?

## 2021-05-29 NOTE — Telephone Encounter (Signed)
Requested Prescriptions  ?Pending Prescriptions Disp Refills  ?? ALLERGY RELIEF 180 MG tablet [Pharmacy Med Name: FEXOFENADINE 180MG  TABLETS (OTC)] 10 tablet 1  ?  Sig: TAKE 1 TABLET(180 MG) BY MOUTH DAILY  ?  ? Ear, Nose, and Throat:  Antihistamines Passed - 05/28/2021 10:16 PM  ?  ?  Passed - Valid encounter within last 12 months  ?  Recent Outpatient Visits   ?      ? 1 month ago Upper respiratory tract infection, unspecified type  ? [facility] [name], [name], MD  ? 2 months ago Tobacco use disorder  ? [facility] [name], [name], MD  ? 2 months ago GAD (generalized anxiety disorder)  ? [facility] [name], [name], MD  ? 5 months ago Recurrent sinus infections  ? [facility] [name], [name], MD  ? 6 months ago Neck pain  ? [facility] [name], [name], MD  ?  ?  ?Future Appointments   ?        ? In 1 month [facility], MD [facility]  ?  ? ?  ?  ?  ? ? ?

## 2021-06-03 ENCOUNTER — Encounter: Payer: Self-pay | Admitting: Physical Therapy

## 2021-06-03 ENCOUNTER — Ambulatory Visit: Payer: 59 | Admitting: Physical Therapy

## 2021-06-03 DIAGNOSIS — R262 Difficulty in walking, not elsewhere classified: Secondary | ICD-10-CM | POA: Diagnosis not present

## 2021-06-03 DIAGNOSIS — M545 Low back pain, unspecified: Secondary | ICD-10-CM

## 2021-06-03 DIAGNOSIS — M6281 Muscle weakness (generalized): Secondary | ICD-10-CM

## 2021-06-03 DIAGNOSIS — G8929 Other chronic pain: Secondary | ICD-10-CM

## 2021-06-03 NOTE — Therapy (Signed)
?OUTPATIENT PHYSICAL THERAPY THORACOLUMBAR EVALUATION ? ? ?Patient Name: Gwendolyn Hansen ?MRN: [id number] ?DOB:January 28, 1970, 51 y.o., female ?Today's Date: 06/03/2021 ? ? PT End of Session - 06/03/21 1432   ? ? Visit Number 1   ? Number of Visits 13   ? Date for PT Re-Evaluation 07/19/21   ? PT Start Time 1432   ? PT Stop Time 1515   ? PT Time Calculation (min) 43 min   ? Activity Tolerance Patient tolerated treatment well   ? Behavior During Therapy [facility] for tasks assessed/performed   ? ?  ?  ? ?  ? ? ?Past Medical History:  ?Diagnosis Date  ? Allergy   ? Anxiety   ? Asthma   ? ?Past Surgical History:  ?Procedure Laterality Date  ? ANKLE SURGERY Right 1999  ? BREAST ENHANCEMENT SURGERY    ? NECK SURGERY  2011  ? ?Patient Active Problem List  ? Diagnosis Date Noted  ? Other intervertebral disc degeneration, lumbar region 05/22/2021  ? Upper respiratory tract infection 04/08/2021  ? Wheezing 03/19/2021  ? Acute non-recurrent sinusitis 03/19/2021  ? Numbness of right hand 03/18/2021  ? Screening for lipid disorders 03/12/2021  ? Urinary urgency 03/12/2021  ? Absence of bladder continence 03/12/2021  ? Bilateral hand numbness 02/27/2021  ? Recurrent sinus infections 12/10/2020  ? Disorientation 12/10/2020  ? COPD GOLD 2/ active smoker  11/13/2020  ? Sinusitis 10/23/2020  ? Cough 10/04/2020  ? Anxiety 10/04/2020  ? Encounter for screening mammogram for malignant neoplasm of breast 10/04/2020  ? Cigarette smoker 10/04/2020  ? Screening for colon cancer 10/04/2020  ? Need for influenza vaccination 10/04/2020  ? 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 Tobacco use disorder 08/18/2014  ? Neck pain 08/17/2014  ? Ankle pain 08/17/2014  ? GAD (generalized anxiety disorder) 08/17/2014  ? Depression 08/17/2014  ? Opioid use disorder, severe, in controlled  environment, dependence (HCC) 08/17/2014  ? History of alcohol dependence (HCC) 08/17/2014  ? ? ?PCP: Loura Pardon, MD ? ?REFERRING PROVIDER: Annell Greening MD ? ?REFERRING DIAG: M54.50,G89.29 (ICD-10-CM) - Chronic right-sided low back pain, unspecified whether sciatica present ? ?THERAPY DIAG:  ?Chronic right-sided low back pain, unspecified whether sciatica present ? ?Muscle weakness (generalized) ? ?Difficulty in walking, not elsewhere classified ? ?ONSET DATE:  years ? ?SUBJECTIVE:                                                                                                                                                                                          ? ?  SUBJECTIVE STATEMENT: ?Pt arriving today reporting fall down her stairs yesterday and she fell into the grass. Pt reporting no injuries other than bruises and scratches. Pt reporting history of her ankle turning. Pt stating her back pain and LE weakness has been ongoing for about 3 years. Pt stating she is always in pain. Pt stating she takes Tylenol Arthritis. Pt stating she wakes up in the middle of the night in tears at times due to pain down her Rt LE.  ? ?PERTINENT HISTORY:  ? anxiety, asthma, depression, ETOH history, allergies, ankle pain, neck pain, DDD L4-5 ? ?PAIN:  ?Are you having pain? Yes:  ?NPRS scale: 8/10 ?Pain location: low back and down Rt LE ?Pain description: shooting, burning ?Aggravating factors: activities, "the more I try to do the worse it gets" ?Relieving factors: Tylenol arthritis ? ? ?PRECAUTIONS: None ? ?WEIGHT BEARING RESTRICTIONS No ? ?FALLS:  ?Has patient fallen in last 6 months? Yes, yesterday 06/02/2021; down 5 steps going off her porch ? ?LIVING ENVIRONMENT: ?Lives with: lives with their family ?Lives in: House/apartment ?Stairs: Yes: External: 5 steps; on right going up ?Has following equipment at home: None ? ?OCCUPATION: not currently working ? ?PLOF: Independent ? ?PATIENT GOALS Stop hurting ? ? ?OBJECTIVE:   ? ?DIAGNOSTIC FINDINGS:  ?Result Date: 05/22/2021 ?AP lateral lumbar spine x-rays demonstrate disc space narrowing L4-5 50% with endplate irregularity and marginal osteophytes at the endplate.  No spondylolisthesis. Impression: Lumbar disc degeneration L4-5.  ? ?PATIENT SURVEYS:  ?06/03/2021: FOTO 26% (predicted 49%) ? ?SCREENING FOR RED FLAGS: ?Bowel or bladder incontinence: Yes: stress incontinence with coughing and sneezing ?Spinal tumors: No ?Cauda equina syndrome: No ?Compression fracture: No ?Abdominal aneurysm: No ? ?COGNITION: ? 06/03/2021: Overall cognitive status: Within functional limits for tasks assessed   ?  ?SENSATION: ?06/03/2021: Pt reporting pain/numbness down Rt LE ? ?MUSCLE LENGTH: ?Hamstrings: Right 55 deg; Left 62 deg ? ? ?POSTURE:  ?Forward head and rounded shoulders, increased thoracic kyphosis and decreased lumbar lordosis ? ?PALPATION: ?TTP bilateral lumbar paraspinals, Rt side SI joint, Rt gluteals into piriformis ? ?LUMBAR ROM:  ? ?Active  ?In degrees A/PROM  ?06/03/2021  ?Flexion 65 c pain  ?Extension 12 c pain  ?Right lateral flexion 22 c pain  ?Left lateral flexion 24  ?Right rotation Limited 25% c pain  ?Left rotation Limited 25%  ? (Blank rows = not tested) ? ?LE ROM: ? ?Active  Right ?06/03/2021 Left ?06/03/2021  ?Hip flexion 90 95  ?Hip extension    ?Hip abduction 45 50  ?Hip adduction    ?Hip internal rotation    ?Hip external rotation    ?Knee flexion 115 122  ?Knee extension    ? (Blank rows = not tested) ? ?LE MMT: ? ?MMT Right ?06/03/2021 Left ?06/03/2021  ?Hip flexion 4/5 4/5  ?Hip extension    ?Hip abduction 4/5 4/5  ?Hip adduction 4/5 4/5  ?Hip internal rotation    ?Hip external rotation    ?Knee flexion 4+/5 4+/5  ?Knee extension 4+/5 4+/5  ?Ankle dorsiflexion    ?Ankle plantarflexion    ?Ankle inversion    ?Ankle eversion    ? (Blank rows = not tested) ? ?LUMBAR SPECIAL TESTS:  ?06/03/2021:  ?Slump test: Positive Rt side,  ?Positive SLR on Rt ? ?FUNCTIONAL TESTS:  ?06/03/2021: 5  times sit to stand: 45 seconds with UE support ? ?GAIT: ?06/03/2021:  ?Distance walked: 30 feet  ?Assistive device utilized: None ?Level of assistance: Complete Independence ?Comments: antalgic forward flexed  gait pattern ? ? ? ?TODAY'S TREATMENT  ?06/03/2021:  ?HEP instruction/performance c cues for techniques, handout provided.  Trial set performed of each for comprehension and symptom assessment.  See below for exercise list. ? ? ?PATIENT EDUCATION:  ?Education details: PT POC, HEP ?Person educated: Patient ?Education method: Explanation, Demonstration, Tactile cues, Verbal cues, and Handouts ?Education comprehension: verbalized understanding and returned demonstration ? ? ?HOME EXERCISE PROGRAM: ?Access Code: [email or web address] ?URL: [email or web address] ?Date: 06/03/2021 ?Prepared by: Narda AmberJennifer Zaria Taha ? ?Exercises ?- Supine Lower Trunk Rotation  - 2 x daily - 7 x weekly - 3 reps - 30 seconds hold ?- Hooklying Hamstring Stretch with Strap  - 2 x daily - 7 x weekly - 3 reps - 30 seconds hold ?- Hooklying Single Knee to Chest Stretch  - 2 x daily - 7 x weekly - 3 reps - 30 seconds hold ?- Seated Thoracic Lumbar Extension  - 2 x daily - 7 x weekly - 3 reps - 10 seconds hold ? ? ? ?ASSESSMENT: ? ?CLINICAL IMPRESSION: ?06/03/2021 ?Patient is a [age] y.o. female who comes to clinic with complaints of low back pain with mobility, strength and movement coordination deficits that impair their ability to perform usual daily and recreational functional activities without increase difficulty/symptoms at this time.Pt with radiation down Rt LE with limitations in balance and increased risk of falls. Pt reporting recent fall yesterday down her steps due to her Rt LE giving way.  Patient to benefit from skilled PT services to address impairments and limitations to improve to previous level of function without restriction secondary to condition.  ? ? ?OBJECTIVE IMPAIRMENTS decreased balance, decreased mobility, difficulty walking,  decreased ROM, decreased strength, impaired flexibility, postural dysfunction, and pain.  ? ?ACTIVITY LIMITATIONS community activity.  ? ?PERSONAL FACTORS anxiety, asthma, depression, ETOH history, allergies, ankle

## 2021-06-05 ENCOUNTER — Telehealth: Payer: 59 | Admitting: Physician Assistant

## 2021-06-05 ENCOUNTER — Telehealth: Payer: 59

## 2021-06-05 ENCOUNTER — Telehealth: Payer: Self-pay | Admitting: Rehabilitative and Restorative Service Providers"

## 2021-06-05 ENCOUNTER — Encounter: Payer: 59 | Admitting: Rehabilitative and Restorative Service Providers"

## 2021-06-05 DIAGNOSIS — J069 Acute upper respiratory infection, unspecified: Secondary | ICD-10-CM

## 2021-06-05 MED ORDER — BENZONATATE 100 MG PO CAPS: 100.0000 mg | ORAL_CAPSULE | Freq: Three times a day (TID) | ORAL | 0 refills | Status: DC | PRN

## 2021-06-05 NOTE — Progress Notes (Signed)
We are sorry that you are not feeling well.  Here is how we plan to help! ° °Based on your presentation I believe you most likely have A cough due to a virus.  This is called viral bronchitis and is best treated by rest, plenty of fluids and control of the cough.  You may use Ibuprofen or Tylenol as directed to help your symptoms.   °  °In addition you may use A prescription cough medication called Tessalon Perles 100mg. You may take 1-2 capsules every 8 hours as needed for your cough. ° ° °From your responses in the eVisit questionnaire you describe inflammation in the upper respiratory tract which is causing a significant cough.  This is commonly called Bronchitis and has four common causes:   °Allergies °Viral Infections °Acid Reflux °Bacterial Infection °Allergies, viruses and acid reflux are treated by controlling symptoms or eliminating the cause. An example might be a cough caused by taking certain blood pressure medications. You stop the cough by changing the medication. Another example might be a cough caused by acid reflux. Controlling the reflux helps control the cough. ° °USE OF BRONCHODILATOR ("RESCUE") INHALERS: °There is a risk from using your bronchodilator too frequently.  The risk is that over-reliance on a medication which only relaxes the muscles surrounding the breathing tubes can reduce the effectiveness of medications prescribed to reduce swelling and congestion of the tubes themselves.  Although you feel brief relief from the bronchodilator inhaler, your asthma may actually be worsening with the tubes becoming more swollen and filled with mucus.  This can delay other crucial treatments, such as oral steroid medications. If you need to use a bronchodilator inhaler daily, several times per day, you should discuss this with your provider.  There are probably better treatments that could be used to keep your asthma under control.  °   °HOME CARE °Only take medications as instructed by your  medical team. °Complete the entire course of an antibiotic. °Drink plenty of fluids and get plenty of rest. °Avoid close contacts especially the very young and the elderly °Cover your mouth if you cough or cough into your sleeve. °Always remember to wash your hands °A steam or ultrasonic humidifier can help congestion.  ° °GET HELP RIGHT AWAY IF: °You develop worsening fever. °You become short of breath °You cough up blood. °Your symptoms persist after you have completed your treatment plan °MAKE SURE YOU  °Understand these instructions. °Will watch your condition. °Will get help right away if you are not doing well or get worse. °  ° °Thank you for choosing an e-visit. ° °Your e-visit answers were reviewed by a board certified advanced clinical practitioner to complete your personal care plan. Depending upon the condition, your plan could have included both over the counter or prescription medications. ° °Please review your pharmacy choice. Make sure the pharmacy is open so you can pick up prescription now. If there is a problem, you may contact your provider through MyChart messaging and have the prescription routed to another pharmacy.  Your safety is important to us. If you have drug allergies check your prescription carefully.  ° °For the next 24 hours you can use MyChart to ask questions about today's visit, request a non-urgent call back, or ask for a work or school excuse. °You will get an email in the next two days asking about your experience. I hope that your e-visit has been valuable and will speed your recovery. ° °

## 2021-06-05 NOTE — Telephone Encounter (Signed)
Left a message regarding her missed appointment and reminded her of her next appointment 6/1/at 1:45 and asked her to call and cancel at [phone] if unable to attend. ?

## 2021-06-09 ENCOUNTER — Encounter: Payer: Self-pay | Admitting: Orthopaedic Surgery

## 2021-06-10 ENCOUNTER — Telehealth: Payer: Self-pay | Admitting: Licensed Clinical Social Worker

## 2021-06-10 ENCOUNTER — Other Ambulatory Visit: Payer: Self-pay | Admitting: Orthopaedic Surgery

## 2021-06-10 NOTE — Telephone Encounter (Signed)
    Clinical Social Work  Care Management   Phone Outreach    06/10/2021 Name: Rosabell Geyer MRN: [id number] DOB: July 05, 1970  [name] Somer Trotter is a 51 y.o. year old female who is a primary care patient of [name], [name], MD .   Reason for referral: Mental Health Counseling and Resources and Grief Counseling.    F/U phone call today to assess needs, progress and barriers with care plan goals.   Telephone outreach was unsuccessful. A HIPPA compliant phone message was left for the patient providing contact information and requesting a return call.   Plan:CCM LCSW will wait for return call. If no return call is received, Will route chart to Care Guide to see if patient would like to reschedule phone appointment   Review of patient status, including review of consultants reports, relevant laboratory and other test results, and collaboration with appropriate care team members and the patient's provider was performed as part of comprehensive patient evaluation and provision of care management services.    Jenel Lucks, MSW, LCSW [facility] Care Management [place]  [facility] [email or web address] Phone [phone] 9:26 AM

## 2021-06-14 ENCOUNTER — Telehealth: Payer: Self-pay

## 2021-06-14 NOTE — Chronic Care Management (AMB) (Signed)
  Care Coordination Note  06/14/2021 Name: Zennie Ayars MRN: [id number] DOB: 11/14/1970  [name] Kellan Boehlke is a 51 y.o. year old female who is a primary care patient of [name], [name], MD and is actively engaged with the care management team. I reached out to Biagio Quint by phone today to assist with re-scheduling a follow up visit with the Licensed Clinical Social Worker  Follow up plan: Unsuccessful telephone outreach attempt made. A HIPAA compliant phone message was left for the patient providing contact information and requesting a return call.  The care management team will reach out to the patient again over the next 7 days.  If patient returns call to provider office, please advise to call Embedded Care Management Care Guide Penne Lash  at [phone]  Penne Lash, RMA Care Guide, Embedded Care Coordination [facility]  [place], [place] [address] Direct Dial: [phone] [email or web address] Website: [email or web address]

## 2021-06-20 ENCOUNTER — Encounter: Payer: 59 | Admitting: Physical Therapy

## 2021-06-20 NOTE — Therapy (Incomplete)
OUTPATIENT PHYSICAL THERAPY TREATMENT NOTE   Patient Name: Gwendolyn Hansen MRN: [id number] DOB:1970-10-02, 51 y.o., female Today's Date: 06/20/2021  PCP: Loura PardonVigg, [name], MD  REFERRING PROVIDER: Annell GreeningYates, Mark MD   END OF SESSION:    Past Medical History:  Diagnosis Date   Allergy    Anxiety    Asthma    Past Surgical History:  Procedure Laterality Date   ANKLE SURGERY Right 1999   BREAST ENHANCEMENT SURGERY     NECK SURGERY  2011   Patient Active Problem List   Diagnosis Date Noted   Other intervertebral disc degeneration, lumbar region 05/22/2021   Upper respiratory tract infection 04/08/2021   Wheezing 03/19/2021   Acute non-recurrent sinusitis 03/19/2021   Numbness of right hand 03/18/2021   Screening for lipid disorders 03/12/2021   Urinary urgency 03/12/2021   Absence of bladder continence 03/12/2021   Bilateral hand numbness 02/27/2021   Recurrent sinus infections 12/10/2020   Disorientation 12/10/2020   COPD GOLD 2/ active smoker  11/13/2020   Sinusitis 10/23/2020   Cough 10/04/2020   Anxiety 10/04/2020   Encounter for screening mammogram for malignant neoplasm of breast 10/04/2020   Cigarette smoker 10/04/2020   Screening for colon cancer 10/04/2020   Need for influenza vaccination 10/04/2020   Brief psychotic disorder (HCC) 08/24/2018   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  Rationale for  Evaluation and Treatment {HABREHAB:27488}  PERTINENT HISTORY: ***  PRECAUTIONS: None  SUBJECTIVE: ***  PAIN:  Are you having pain? {OPRCPAIN:27236}   OBJECTIVE: (objective measures completed at initial evaluation unless otherwise dated)   PATIENT SURVEYS:  06/03/2021: FOTO 26% (predicted 49%)                          SENSATION: 06/03/2021: Pt reporting pain/numbness down Rt LE   MUSCLE LENGTH: Hamstrings: Right 55 deg; Left 62 deg     POSTURE:  Forward head and rounded shoulders, increased thoracic kyphosis and decreased lumbar lordosis   PALPATION: TTP bilateral lumbar paraspinals, Rt side SI joint, Rt gluteals into piriformis   LUMBAR ROM:    Active  In degrees A/PROM  06/03/2021  Flexion 65 c pain  Extension 12 c pain  Right lateral flexion 22 c pain  Left lateral flexion 24  Right rotation Limited 25% c pain  Left rotation Limited 25%   (Blank rows = not tested)   LE ROM:   Active  Right 06/03/2021 Left 06/03/2021  Hip flexion 90 95  Hip extension      Hip abduction 45 50  Hip adduction      Hip internal rotation      Hip external rotation      Knee flexion 115 122  Knee extension       (Blank rows = not tested)   LE MMT:   MMT Right 06/03/2021 Left 06/03/2021  Hip flexion 4/5 4/5  Hip extension      Hip abduction 4/5 4/5  Hip  adduction 4/5 4/5  Hip internal rotation      Hip external rotation      Knee flexion 4+/5 4+/5  Knee extension 4+/5 4+/5  Ankle dorsiflexion      Ankle plantarflexion      Ankle inversion      Ankle eversion       (Blank rows = not tested)   LUMBAR SPECIAL TESTS:  06/03/2021:  Slump test: Positive Rt side,  Positive SLR on Rt   FUNCTIONAL TESTS:  06/03/2021: 5 times sit to stand: 45 seconds with UE support   GAIT: 06/03/2021:  Distance walked: 30 feet  Assistive device utilized: None Level of assistance: Complete Independence Comments: antalgic forward flexed gait pattern       TODAY'S TREATMENT   06/19/21 ***  06/03/2021:  HEP instruction/performance c cues for techniques, handout provided.  Trial set performed of each for comprehension and symptom assessment.  See below for exercise list.     PATIENT EDUCATION:  Education details: PT POC, HEP Person educated: Patient Education method: Explanation, Demonstration, Tactile cues, Verbal cues, and Handouts Education comprehension: verbalized understanding and returned demonstration     HOME EXERCISE PROGRAM: Access Code: [email or web address] URL: [email or web address] Date: 06/03/2021 Prepared by: Narda Amber   Exercises - Supine Lower Trunk Rotation  - 2 x daily - 7 x weekly - 3 reps - 30 seconds hold - Hooklying Hamstring Stretch with Strap  - 2 x daily - 7 x weekly - 3 reps - 30 seconds hold - Hooklying Single Knee to Chest Stretch  - 2 x daily - 7 x weekly - 3 reps - 30 seconds hold - Seated Thoracic Lumbar Extension  - 2 x daily - 7 x weekly - 3 reps - 10 seconds hold       ASSESSMENT:   CLINICAL IMPRESSION: ***  06/03/2021 Patient is a 51 y.o. female who comes to clinic with complaints of low back pain with mobility, strength and movement coordination deficits that impair their ability to perform usual daily and recreational functional activities without increase difficulty/symptoms at this time.Pt with radiation down Rt LE with limitations in balance and increased risk of falls. Pt reporting recent fall yesterday down her steps due to her Rt LE giving way.  Patient to benefit from skilled PT services to address impairments and limitations to improve to previous level of function without restriction secondary to condition.      OBJECTIVE IMPAIRMENTS decreased balance, decreased mobility, difficulty walking, decreased ROM, decreased strength, impaired flexibility, postural dysfunction, and pain.    ACTIVITY LIMITATIONS community activity.    PERSONAL FACTORS anxiety, asthma, depression, ETOH history, allergies,  ankle pain, neck pain, DDD L4-5 are also affecting patient's functional outcome.      REHAB POTENTIAL: Good   CLINICAL DECISION MAKING: Stable/uncomplicated   EVALUATION COMPLEXITY: Low     GOALS: Goals reviewed with patient? Yes Short term PT Goals (target date for Short term goals are 3 weeks 06/28/2021) Patient will demonstrate independent use of initial home exercise program to maintain progress from in clinic treatments. Goal status: New   Long term PT goals (target dates for all long term goals are 6 weeks  07/19/2021 ) Patient will demonstrate/report pain </= 2/10 to facilitate minimal limitation in daily activity secondary to pain symptoms. Goal status: New   Patient will demonstrate independent use of advanced home exercise program to facilitate ability to maintain/progress functional gains from skilled physical therapy services. Goal status: New   Patient will demonstrate  FOTO outcome > or = 49 % to indicate reduced disability due to condition. Goal status: New   Pt will be able to improve her bilateral hip strength to 5/5 in order to improve functional mobility.  Goal status: New       5.  Pt will improve her 5 time sit to stand to </= 20 seconds with/without UE support.   Goal status: New     PLAN: PT FREQUENCY: 2x/week   PT DURATION: 6 weeks   PLANNED INTERVENTIONS: Therapeutic exercises, Therapeutic activity, Neuromuscular re-education, Balance training, Gait training, Patient/Family education, Joint mobilization, Stair training, Dry Needling, Spinal mobilization, Cryotherapy, Moist heat, Taping, Traction, Ultrasound, Ionotophoresis 4mg /ml Dexamethasone, and Manual therapy.   PLAN FOR NEXT SESSION: *** Balance assessment due to recent fall) Nustep, core strengthening, lumbar stretching, review HEP      , PT 06/20/2021, 9:57 AM

## 2021-06-24 ENCOUNTER — Encounter: Payer: 59 | Admitting: Physical Therapy

## 2021-06-24 ENCOUNTER — Other Ambulatory Visit: Payer: Self-pay

## 2021-06-24 DIAGNOSIS — M5136 Other intervertebral disc degeneration, lumbar region: Secondary | ICD-10-CM

## 2021-06-24 DIAGNOSIS — G8929 Other chronic pain: Secondary | ICD-10-CM

## 2021-06-24 NOTE — Chronic Care Management (AMB) (Signed)
  Care Coordination Note  06/24/2021 Name: Gwendolyn Hansen MRN: [id number] DOB: [birth year]  Gwendolyn Hansen is a 51 y.o. year old female who is a primary care patient of [name], [name], MD and is actively engaged with the care management team. I reached out to Gwendolyn Hansen by phone today to assist with re-scheduling a follow up visit with the Licensed Clinical Social Worker  Follow up plan: Unsuccessful telephone outreach attempt made. A HIPAA compliant phone message was left for the patient providing contact information and requesting a return call.  The care management team will reach out to the patient again over the next 7 days.  If patient returns call to provider office, please advise to call Gwendolyn Hansen  at [address], [place], [place], [place] [address] Direct Dial: [phone] [email or web address] Website: [email or web address]

## 2021-06-27 ENCOUNTER — Telehealth: Payer: Self-pay | Admitting: Physical Therapy

## 2021-06-27 ENCOUNTER — Ambulatory Visit: Payer: 59 | Admitting: Surgery

## 2021-06-27 ENCOUNTER — Encounter: Payer: 59 | Admitting: Physical Therapy

## 2021-06-27 NOTE — Telephone Encounter (Signed)
LVM for pt as she did not show for her PT appt.  This is her 2nd no show so will allow her to schedule PT appts on a weekly basis and will d/c if she no shows again.  Advised to call office if she would like to reschedule her PT appt.  Clarita Crane, PT, DPT 06/27/21 2:04 PM  [facility] [address] [place], [place], [address] Phone: [phone]   Fax:  [phone]

## 2021-06-27 NOTE — Therapy (Incomplete)
OUTPATIENT PHYSICAL THERAPY TREATMENT NOTE   Patient Name: Gwendolyn Hansen MRN: [id number] DOB:1970/08/28, 51 y.o., female Today's Date: 06/27/2021  PCP: Loura Pardon, MD  REFERRING PROVIDER: Annell Greening MD   END OF SESSION:    Past Medical History:  Diagnosis Date   Allergy    Anxiety    Asthma    Past Surgical History:  Procedure Laterality Date   ANKLE SURGERY Right 1999   BREAST ENHANCEMENT SURGERY     NECK SURGERY  2011   Patient Active Problem List   Diagnosis Date Noted   Other intervertebral disc degeneration, lumbar region 05/22/2021   Upper respiratory tract infection 04/08/2021   Wheezing 03/19/2021   Acute non-recurrent sinusitis 03/19/2021   Numbness of right hand 03/18/2021   Screening for lipid disorders 03/12/2021   Urinary urgency 03/12/2021   Absence of bladder continence 03/12/2021   Bilateral hand numbness 02/27/2021   Recurrent sinus infections 12/10/2020   Disorientation 12/10/2020   COPD GOLD 2/ active smoker  11/13/2020   Sinusitis 10/23/2020   Cough 10/04/2020   Anxiety 10/04/2020   Encounter for screening mammogram for malignant neoplasm of breast 10/04/2020   Cigarette smoker 10/04/2020   Screening for colon cancer 10/04/2020   Need for influenza vaccination 10/04/2020   Brief psychotic disorder (HCC) 08/24/2018   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  Rationale for  Evaluation and Treatment {HABREHAB:27488}  PERTINENT HISTORY: ***  PRECAUTIONS: None  SUBJECTIVE: ***  PAIN:  Are you having pain? {OPRCPAIN:27236}   OBJECTIVE: (objective measures completed at initial evaluation unless otherwise dated)   PATIENT SURVEYS:  06/03/2021: FOTO 26% (predicted 49%)                          SENSATION: 06/03/2021: Pt reporting pain/numbness down Rt LE   MUSCLE LENGTH: Hamstrings: Right 55 deg; Left 62 deg     POSTURE:  Forward head and rounded shoulders, increased thoracic kyphosis and decreased lumbar lordosis   PALPATION: TTP bilateral lumbar paraspinals, Rt side SI joint, Rt gluteals into piriformis   LUMBAR ROM:    Active  In degrees A/PROM  06/03/2021  Flexion 65 c pain  Extension 12 c pain  Right lateral flexion 22 c pain  Left lateral flexion 24  Right rotation Limited 25% c pain  Left rotation Limited 25%   (Blank rows = not tested)   LE ROM:   Active  Right 06/03/2021 Left 06/03/2021  Hip flexion 90 95  Hip extension      Hip abduction 45 50  Hip adduction      Hip internal rotation      Hip external rotation      Knee flexion 115 122  Knee extension       (Blank rows = not tested)   LE MMT:   MMT Right 06/03/2021 Left 06/03/2021  Hip flexion 4/5 4/5  Hip extension      Hip abduction 4/5 4/5  Hip  adduction 4/5 4/5  Hip internal rotation      Hip external rotation      Knee flexion 4+/5 4+/5  Knee extension 4+/5 4+/5  Ankle dorsiflexion      Ankle plantarflexion      Ankle inversion      Ankle eversion       (Blank rows = not tested)   LUMBAR SPECIAL TESTS:  06/03/2021:  Slump test: Positive Rt side,  Positive SLR on Rt   FUNCTIONAL TESTS:  06/03/2021: 5 times sit to stand: 45 seconds with UE support   GAIT: 06/03/2021:  Distance walked: 30 feet  Assistive device utilized: None Level of assistance: Complete Independence Comments: antalgic forward flexed gait pattern       TODAY'S TREATMENT   06/27/21 ***  06/03/2021:  HEP instruction/performance c cues for techniques, handout provided.  Trial set performed of each for comprehension and symptom assessment.  See below for exercise list.     PATIENT EDUCATION:  Education details: PT POC, HEP Person educated: Patient Education method: Explanation, Demonstration, Tactile cues, Verbal cues, and Handouts Education comprehension: verbalized understanding and returned demonstration     HOME EXERCISE PROGRAM: Access Code: [email or web address] URL: [email or web address] Date: 06/03/2021 Prepared by: Narda AmberJennifer Martin   Exercises - Supine Lower Trunk Rotation  - 2 x daily - 7 x weekly - 3 reps - 30 seconds hold - Hooklying Hamstring Stretch with Strap  - 2 x daily - 7 x weekly - 3 reps - 30 seconds hold - Hooklying Single Knee to Chest Stretch  - 2 x daily - 7 x weekly - 3 reps - 30 seconds hold - Seated Thoracic Lumbar Extension  - 2 x daily - 7 x weekly - 3 reps - 10 seconds hold       ASSESSMENT:   CLINICAL IMPRESSION: ***  06/03/2021 Patient is a 51 y.o. female who comes to clinic with complaints of low back pain with mobility, strength and movement coordination deficits that impair their ability to perform usual daily and recreational functional activities without increase difficulty/symptoms at this time.Pt with radiation down Rt LE with limitations in balance and increased risk of falls. Pt reporting recent fall yesterday down her steps due to her Rt LE giving way.  Patient to benefit from skilled PT services to address impairments and limitations to improve to previous level of function without restriction secondary to condition.      OBJECTIVE IMPAIRMENTS decreased balance, decreased mobility, difficulty walking, decreased ROM, decreased strength, impaired flexibility, postural dysfunction, and pain.    ACTIVITY LIMITATIONS community activity.    PERSONAL FACTORS anxiety, asthma, depression, ETOH history, allergies,  ankle pain, neck pain, DDD L4-5 are also affecting patient's functional outcome.      REHAB POTENTIAL: Good   CLINICAL DECISION MAKING: Stable/uncomplicated   EVALUATION COMPLEXITY: Low     GOALS: Goals reviewed with patient? Yes Short term PT Goals (target date for Short term goals are 3 weeks 06/28/2021) Patient will demonstrate independent use of initial home exercise program to maintain progress from in clinic treatments. Goal status: New   Long term PT goals (target dates for all long term goals are 6 weeks  07/19/2021 ) Patient will demonstrate/report pain </= 2/10 to facilitate minimal limitation in daily activity secondary to pain symptoms. Goal status: New   Patient will demonstrate independent use of advanced home exercise program to facilitate ability to maintain/progress functional gains from skilled physical therapy services. Goal status: New   Patient will demonstrate  FOTO outcome > or = 49 % to indicate reduced disability due to condition. Goal status: New   Pt will be able to improve her bilateral hip strength to 5/5 in order to improve functional mobility.  Goal status: New       5.  Pt will improve her 5 time sit to stand to </= 20 seconds with/without UE support.   Goal status: New     PLAN: PT FREQUENCY: 2x/week   PT DURATION: 6 weeks   PLANNED INTERVENTIONS: Therapeutic exercises, Therapeutic activity, Neuromuscular re-education, Balance training, Gait training, Patient/Family education, Joint mobilization, Stair training, Dry Needling, Spinal mobilization, Cryotherapy, Moist heat, Taping, Traction, Ultrasound, Ionotophoresis 4mg /ml Dexamethasone, and Manual therapy.   PLAN FOR NEXT SESSION: *** Balance assessment due to recent fall) Nustep, core strengthening, lumbar stretching, review HEP      , PT 06/27/2021, 11:59 AM

## 2021-07-03 NOTE — Chronic Care Management (AMB) (Signed)
  Care Coordination Note  07/03/2021 Name: Ieasha Boerema MRN: [id number] DOB: 11-21-1970  [name] is a 51 y.o. year old female who is a primary care patient of [name], [name], MD and is actively engaged with the care management team. I reached out to Biagio Quint by phone today to assist with re-scheduling a follow up visit with the Licensed Clinical Social Worker  Follow up plan: Unable to make contact on outreach attempts x 3. PCP [name], [name], MD notified via routed documentation in medical record.   Penne Lash, RMA Care Guide, Embedded Care Coordination [facility]  [place], [place] [address] Direct Dial: [phone] [email or web address]: [email or web address]

## 2021-07-05 ENCOUNTER — Ambulatory Visit
Admission: RE | Admit: 2021-07-05 | Discharge: 2021-07-05 | Disposition: A | Discharge status: Home / Self Care | Payer: 59 | Source: Ambulatory Visit | Attending: Orthopaedic Surgery | Admitting: Orthopaedic Surgery

## 2021-07-05 DIAGNOSIS — M545 Low back pain, unspecified: Secondary | ICD-10-CM | POA: Diagnosis present

## 2021-07-05 DIAGNOSIS — G8929 Other chronic pain: Secondary | ICD-10-CM | POA: Diagnosis present

## 2021-07-05 DIAGNOSIS — M5136 Other intervertebral disc degeneration, lumbar region: Secondary | ICD-10-CM | POA: Diagnosis present

## 2021-07-05 IMAGING — MR MR LUMBAR SPINE W/O CM
5 series · 31 of 48 positions shown · non-contrast
Comparison: None Available.

CLINICAL DATA: Spinal stenosis, lumbar; chronic low back pain with
right leg pain

EXAM:
MRI LUMBAR SPINE WITHOUT CONTRAST
TECHNIQUE: Multiplanar, multisequence MR imaging of the lumbar spine was
performed. No intravenous contrast was administered.

[Series 5: T2 · sagittal · 4.0mm · 0.81mm/px · 6 of 15 slices shown (1 of 2)]
[im 1/15]
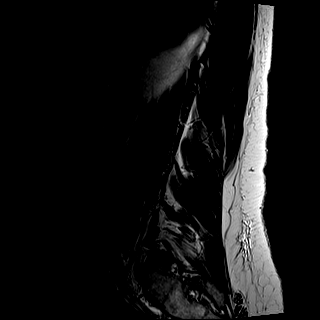
[im 3/15]
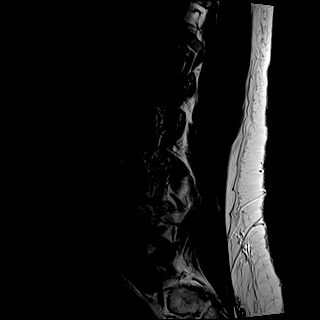
[im 6/15]
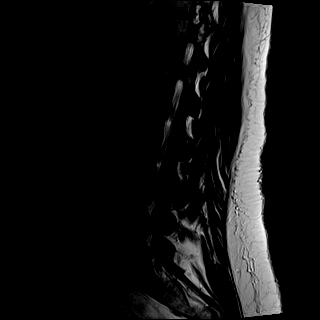
[im 9/15]
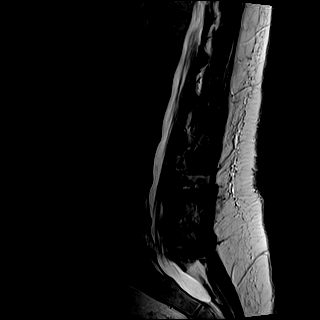
[im 12/15]
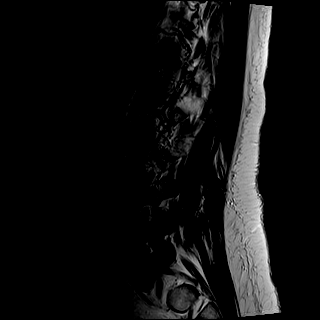
[im 15/15]
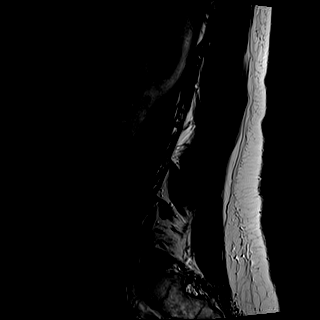

[Series 6: T1 · sagittal · 4.0mm · 0.81mm/px · 6 of 15 slices shown (1 of 2)]
[im 1/15]
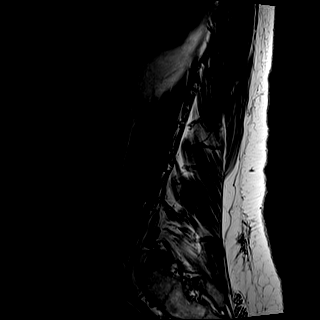
[im 3/15]
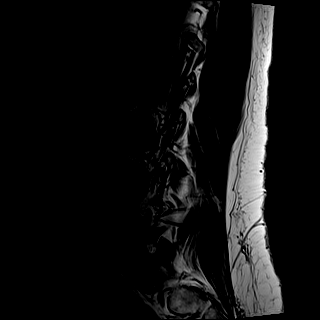
[im 6/15]
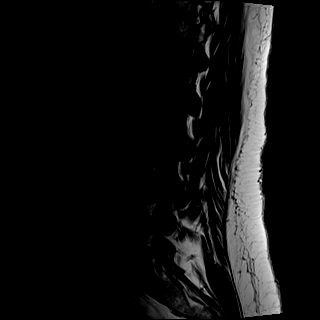
[im 9/15]
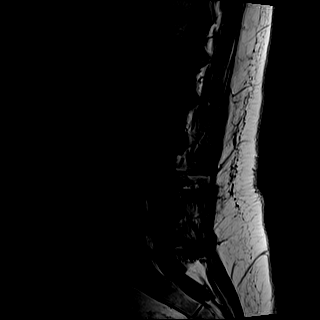
[im 12/15]
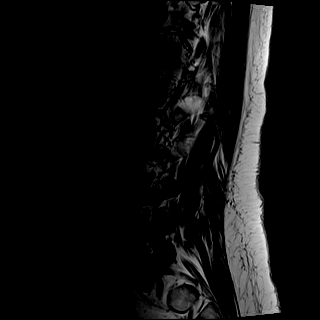
[im 15/15]
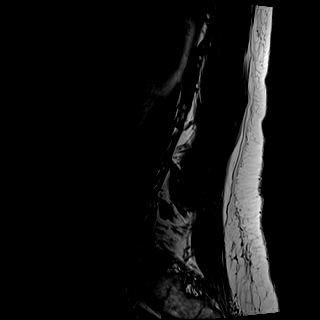

[Series 7: STIR · sagittal · 4.0mm · 0.41mm/px · 1 of 15 slices shown]
[im 1/15]
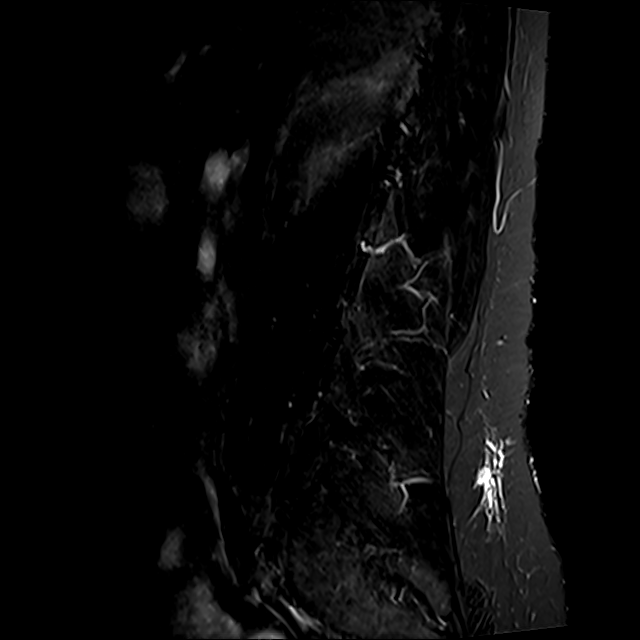

[Series 8: T2 · axial · 4.0mm · 0.78mm/px · z∈[-68,+150]mm · 9 of 36 slices shown (2 of 2)]
[im 1/36]
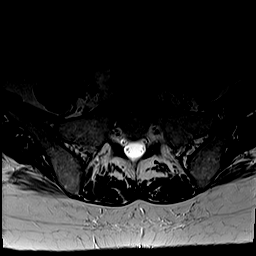
[im 6/36]
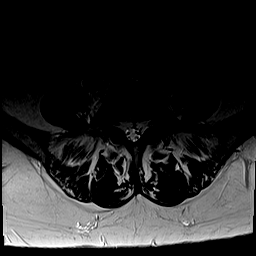
[im 11/36]
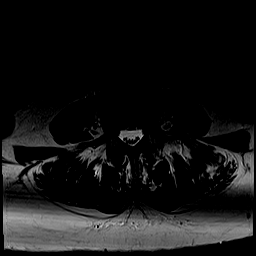
[im 16/36]
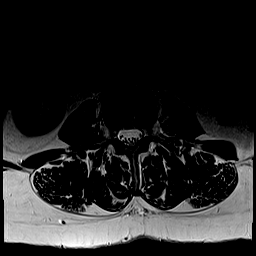
[im 18/36]
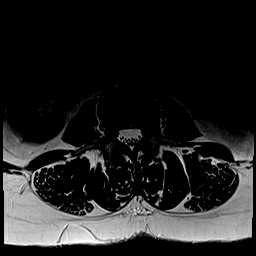
[im 21/36]
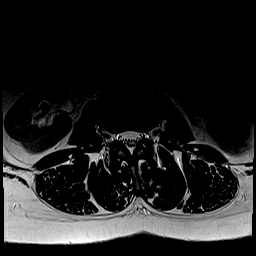
[im 26/36]
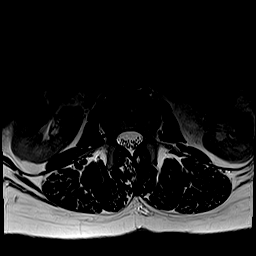
[im 31/36]
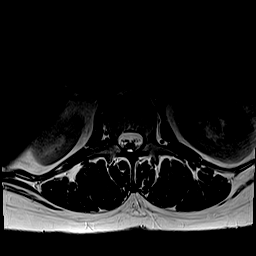
[im 36/36]
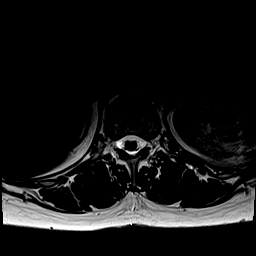

[Series 9: T1 · axial · 4.0mm · 0.39mm/px · z∈[-68,+150]mm · 9 of 36 slices shown (2 of 2)]
[im 1/36]
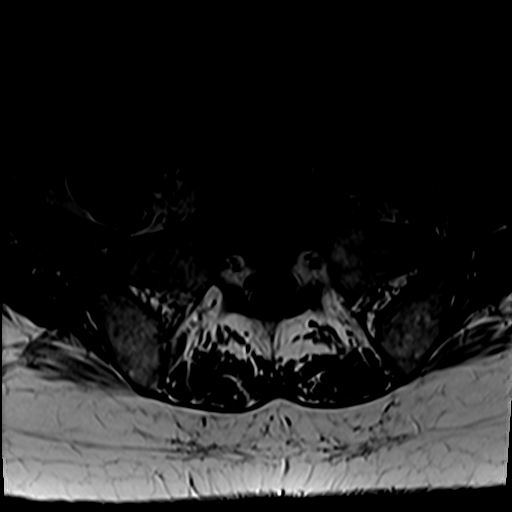
[im 6/36]
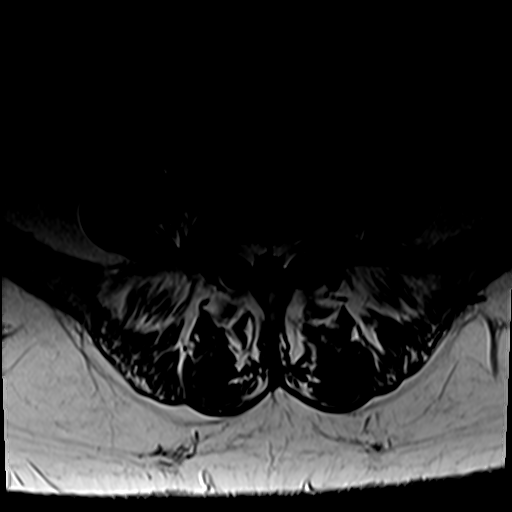
[im 11/36]
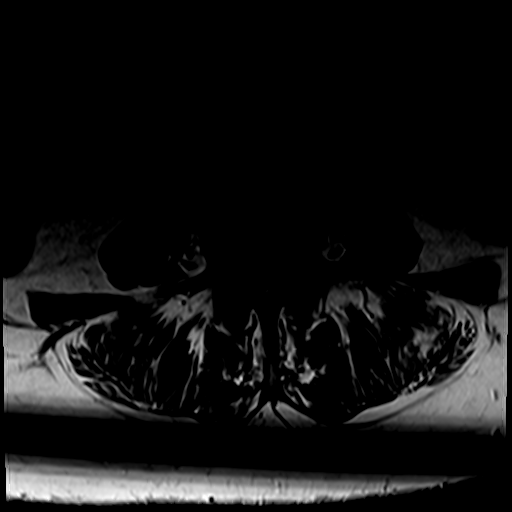
[im 16/36]
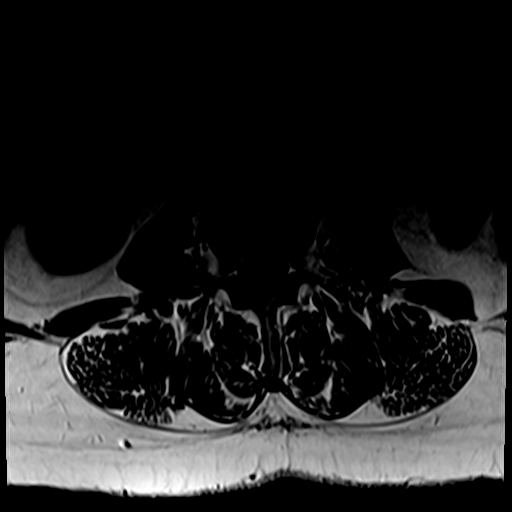
[im 18/36]
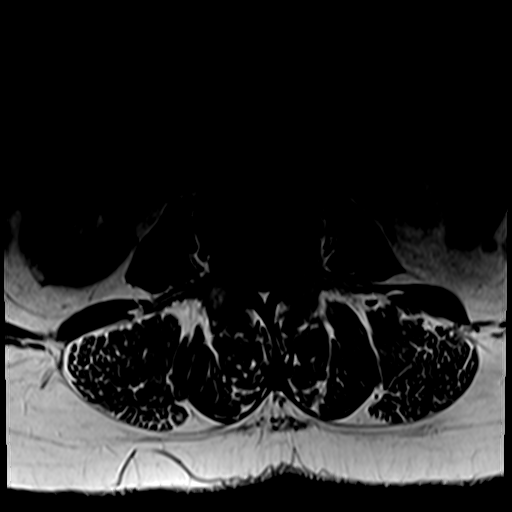
[im 21/36]
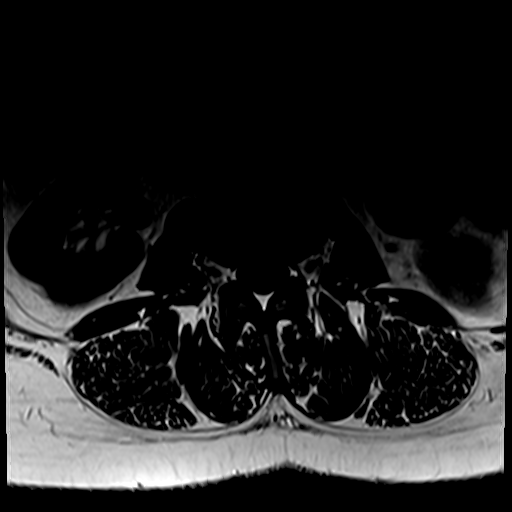
[im 26/36]
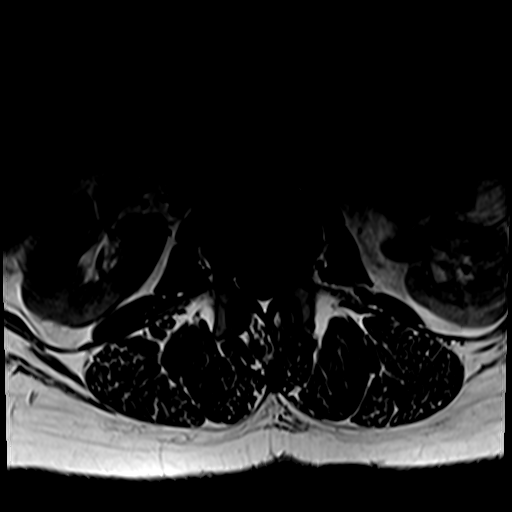
[im 31/36]
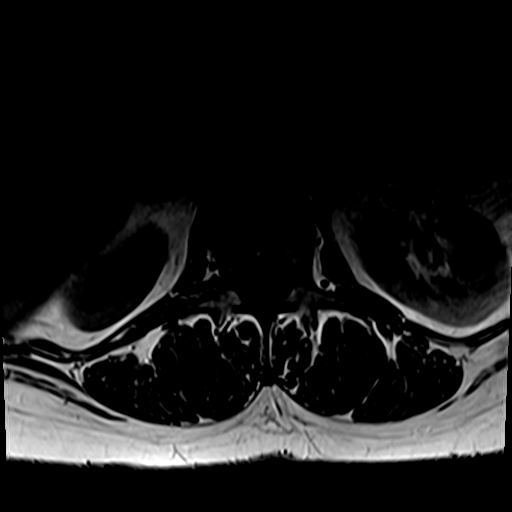
[im 36/36]
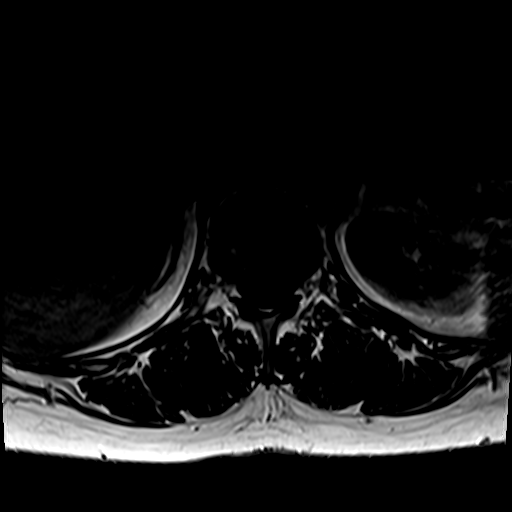

[31 of 48 positions shown; findings below may reference images not displayed]

FINDINGS: Segmentation:  Standard.

Alignment:  Trace retrolisthesis at L4-L5.

Vertebrae: Vertebral body heights are maintained. No substantial
marrow edema. No suspicious osseous lesion.

Conus medullaris and cauda equina: Conus extends to the L1-L2 level.
Conus and cauda equina appear normal.

Paraspinal and other soft tissues: Unremarkable

Disc levels:

L1-L2:  No canal or foraminal stenosis.

L2-L3:  No canal or foraminal stenosis.

L3-L4:  Disc bulge.  No canal or foraminal stenosis.

L4-L5: Disc desiccation and height loss. Disc bulge with endplate
osteophytic ridging. Minor facet arthropathy. Minor canal stenosis.
Minor foraminal stenosis, right greater than left.

L5-S1: Disc desiccation without height loss. Disc bulge with
superimposed central protrusion. Minor facet arthropathy. No canal
stenosis. Narrowing of the right subarticular recesses. Mild
foraminal stenosis.
IMPRESSION: Lower lumbar degenerative changes as detailed above. No high-grade
canal or foraminal stenosis. There is right subarticular recess
narrowing at L5-S1.

## 2021-07-08 ENCOUNTER — Emergency Department
Admission: EM | Admit: 2021-07-08 | Discharge: 2021-07-08 | Disposition: A | Discharge status: Home / Self Care | Payer: 59 | Attending: Emergency Medicine | Admitting: Emergency Medicine

## 2021-07-08 ENCOUNTER — Encounter: Payer: Self-pay | Admitting: *Deleted

## 2021-07-08 ENCOUNTER — Emergency Department: Payer: 59

## 2021-07-08 ENCOUNTER — Other Ambulatory Visit: Payer: Self-pay

## 2021-07-08 DIAGNOSIS — R350 Frequency of micturition: Secondary | ICD-10-CM | POA: Diagnosis not present

## 2021-07-08 DIAGNOSIS — M4802 Spinal stenosis, cervical region: Secondary | ICD-10-CM | POA: Diagnosis not present

## 2021-07-08 DIAGNOSIS — J019 Acute sinusitis, unspecified: Secondary | ICD-10-CM | POA: Insufficient documentation

## 2021-07-08 DIAGNOSIS — J449 Chronic obstructive pulmonary disease, unspecified: Secondary | ICD-10-CM | POA: Diagnosis not present

## 2021-07-08 DIAGNOSIS — R202 Paresthesia of skin: Secondary | ICD-10-CM | POA: Insufficient documentation

## 2021-07-08 LAB — COMPREHENSIVE METABOLIC PANEL
ALT: 16 U/L (ref 0–44)
AST: 19 U/L (ref 15–41)
Albumin: 4.4 g/dL (ref 3.5–5.0)
Alkaline Phosphatase: 43 U/L (ref 38–126)
Anion gap: 6 (ref 5–15)
BUN: 18 mg/dL (ref 6–20)
CO2: 29 mmol/L (ref 22–32)
Calcium: 9.7 mg/dL (ref 8.9–10.3)
Chloride: 104 mmol/L (ref 98–111)
Creatinine, Ser: 1.01 mg/dL — ABNORMAL HIGH (ref 0.44–1.00)
GFR, Estimated: >60 mL/min (ref >60)
Glucose, Bld: 71 mg/dL (ref 70–99)
Potassium: 4.2 mmol/L (ref 3.5–5.1)
Sodium: 139 mmol/L (ref 135–145)
Total Bilirubin: 0.6 mg/dL (ref 0.3–1.2)
Total Protein: 7 g/dL (ref 6.5–8.1)

## 2021-07-08 LAB — DIFFERENTIAL
Abs Immature Granulocytes: 0.02 10*3/uL (ref 0.00–0.07)
Basophils Absolute: 0.1 10*3/uL (ref 0.0–0.1)
Basophils Relative: 1 %
Eosinophils Absolute: 0.2 10*3/uL (ref 0.0–0.5)
Eosinophils Relative: 3 %
Immature Granulocytes: 0 %
Lymphocytes Relative: 30 %
Lymphs Abs: 2.2 10*3/uL (ref 0.7–4.0)
Monocytes Absolute: 0.5 10*3/uL (ref 0.1–1.0)
Monocytes Relative: 7 %
Neutro Abs: 4.3 10*3/uL (ref 1.7–7.7)
Neutrophils Relative %: 59 %

## 2021-07-08 LAB — CBC
HCT: 43.6 % (ref 36.0–46.0)
Hemoglobin: 14.1 g/dL (ref 12.0–15.0)
MCH: 30.1 pg (ref 26.0–34.0)
MCHC: 32.3 g/dL (ref 30.0–36.0)
MCV: 93.2 fL (ref 80.0–100.0)
Platelets: 297 10*3/uL (ref 150–400)
RBC: 4.68 MIL/uL (ref 3.87–5.11)
RDW: 12.9 % (ref 11.5–15.5)
WBC: 7.4 10*3/uL (ref 4.0–10.5)
nRBC: 0 % (ref 0.0–0.2)

## 2021-07-08 LAB — URINALYSIS, COMPLETE (UACMP) WITH MICROSCOPIC
Bacteria, UA: NONE SEEN
Bilirubin Urine: NEGATIVE
Glucose, UA: NEGATIVE mg/dL
Hgb urine dipstick: NEGATIVE
Ketones, ur: NEGATIVE mg/dL
Leukocytes,Ua: NEGATIVE
Nitrite: NEGATIVE
Protein, ur: NEGATIVE mg/dL
Specific Gravity, Urine: 1.009 (ref 1.005–1.030)
pH: 5 (ref 5.0–8.0)

## 2021-07-08 LAB — ETHANOL: Alcohol, Ethyl (B): <10 mg/dL (ref <10)

## 2021-07-08 LAB — PROTIME-INR
INR: 1 (ref 0.8–1.2)
Prothrombin Time: 12.6 seconds (ref 11.4–15.2)

## 2021-07-08 LAB — APTT: aPTT: 30 seconds (ref 24–36)

## 2021-07-08 IMAGING — CT CT HEAD W/O CM
4 series · 17 of 47 positions shown, 19 images · non-contrast
Comparison: [DATE]

CLINICAL DATA: Acute neurological deficit with stroke suspected.
Right-sided numbness for 3 days.



[Series 2: head wo · axial · 0.43mm/px · z∈[-124,-4]mm · 7 of 34 slices shown, 9 images]
[im 5/34  brain]
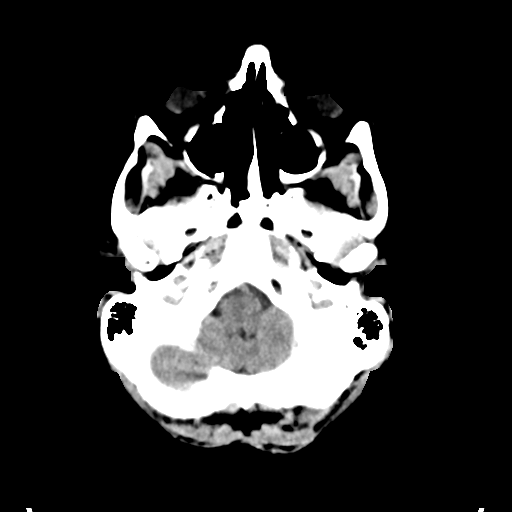
[im 5/34  bone]
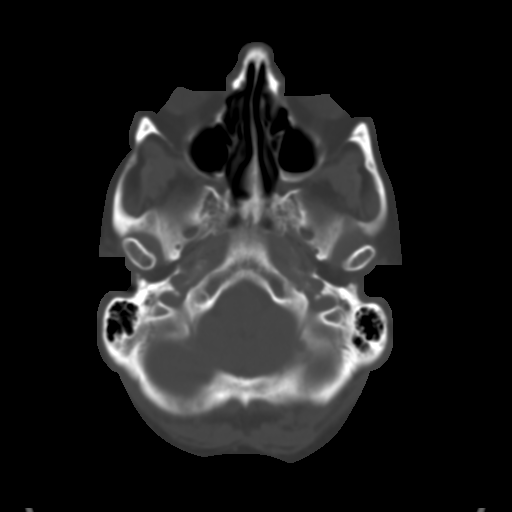
[im 9/34  brain]
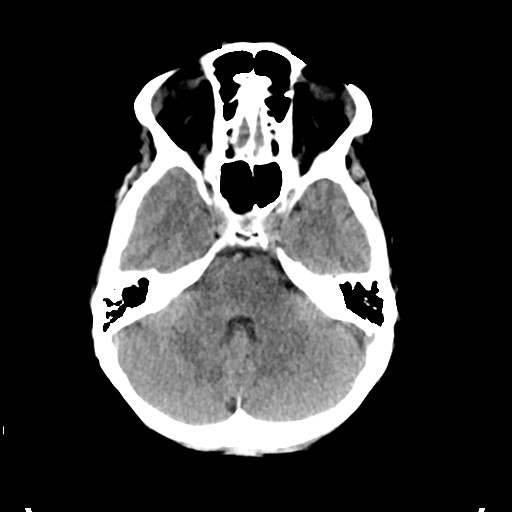
[im 13/34  brain]
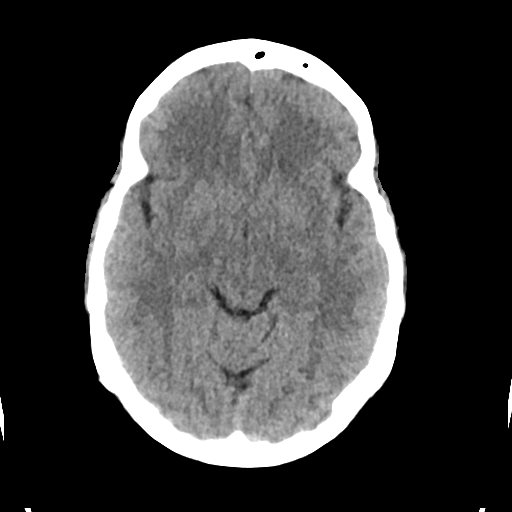
[im 17/34  brain]
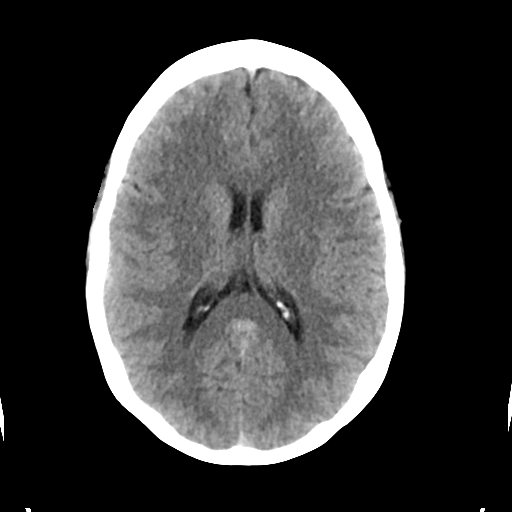
[im 21/34  brain]
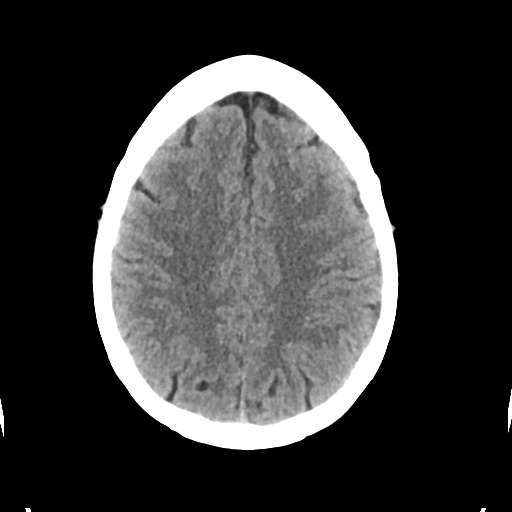
[im 21/34  bone]
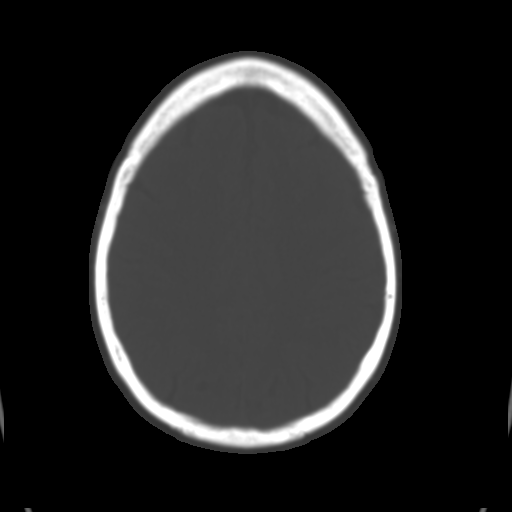
[im 25/34  brain]
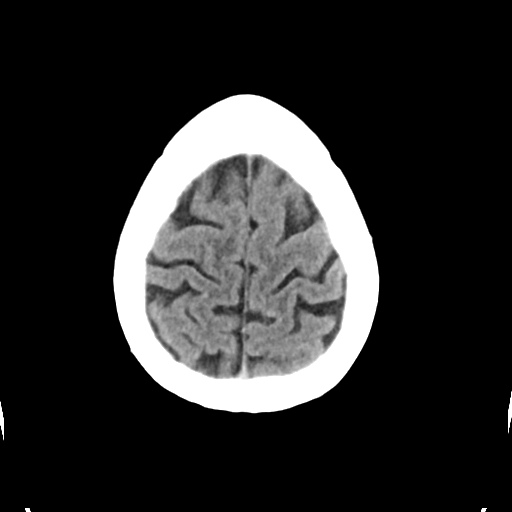
[im 29/34  brain]
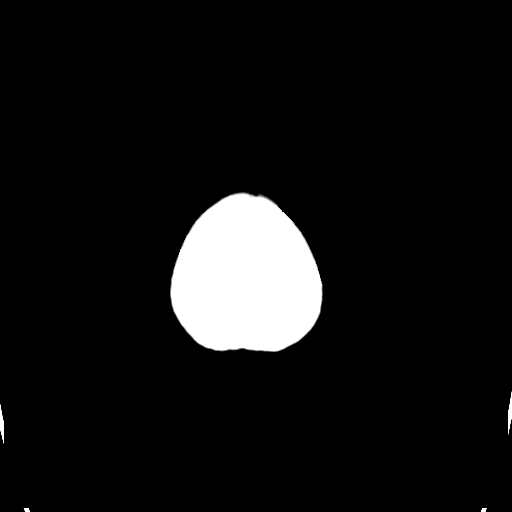

[Series 3: head bone · axial · 0.43mm/px · z∈[-128,-70]mm · 4 of 84 slices shown]
[im 9/84  bone]
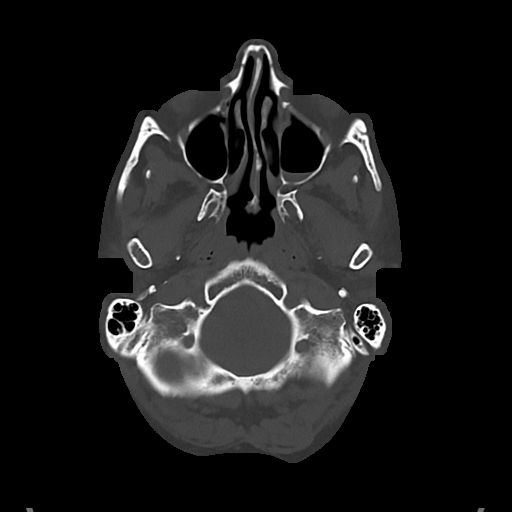
[im 17/84  bone]
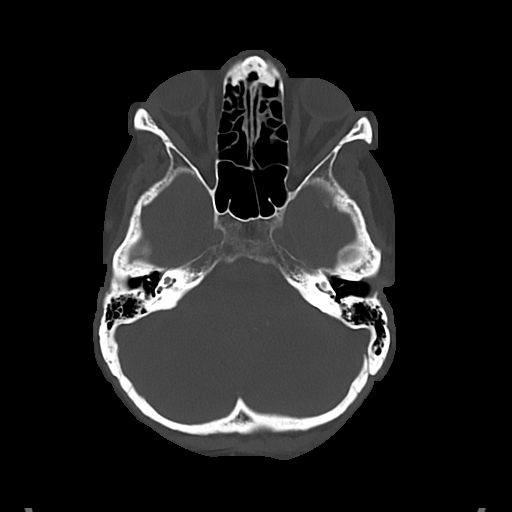
[im 25/84  bone]
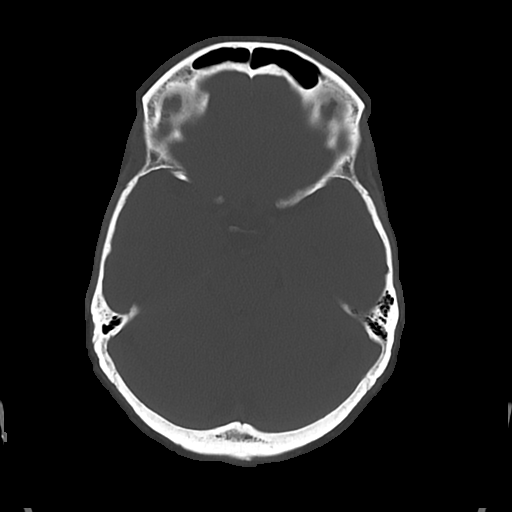
[im 38/84  bone]
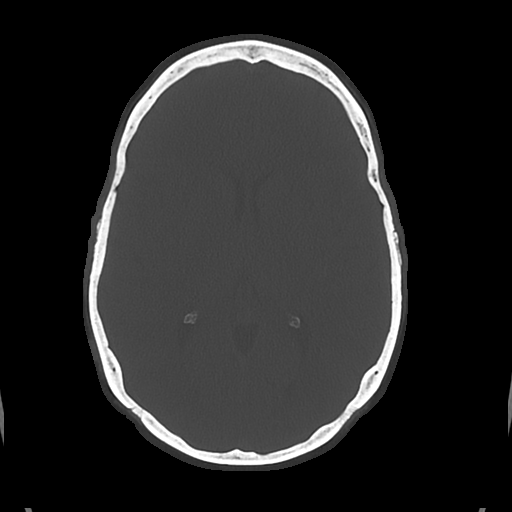

[Series 4: cor soft · coronal · 0.34mm/px · 3 of 68 slices shown]
[im 23/68  brain]
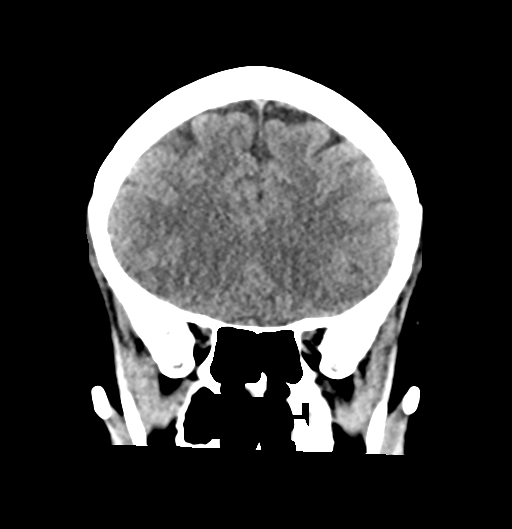
[im 30/68  brain]
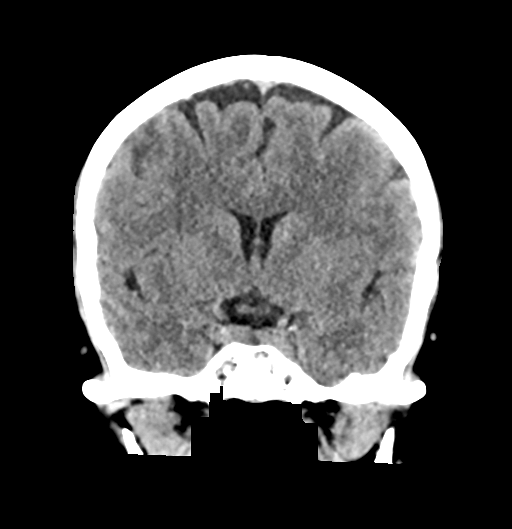
[im 38/68  brain]
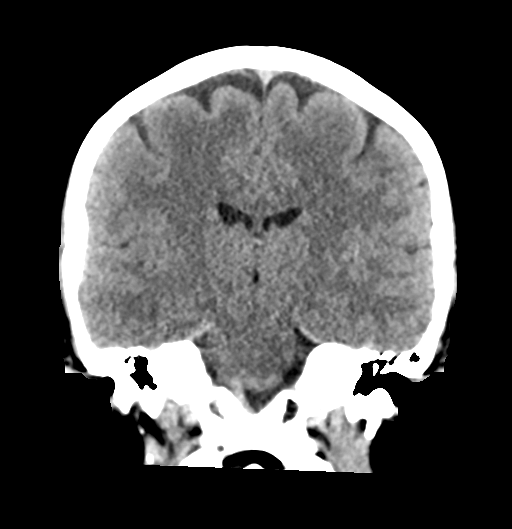

[Series 5: sag soft · sagittal · 0.36mm/px · 3 of 57 slices shown]
[im 19/57  brain]
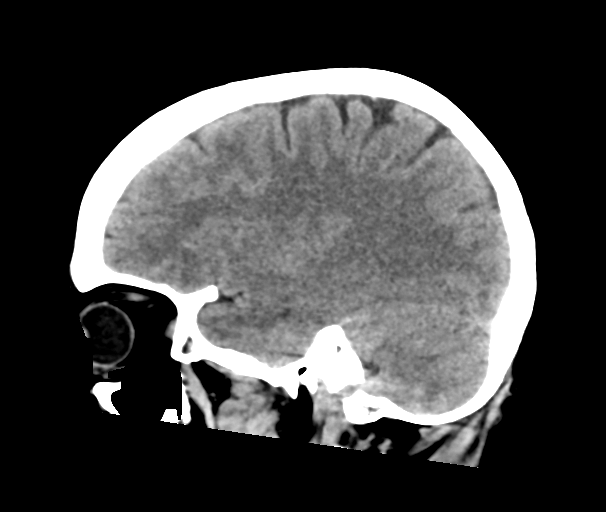
[im 29/57  brain]
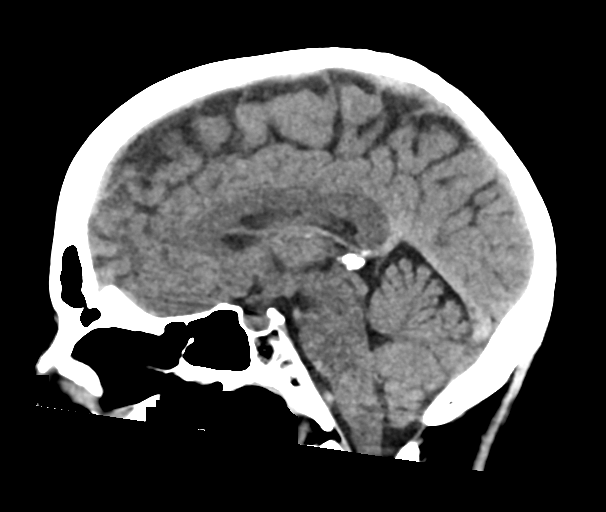
[im 38/57  brain]
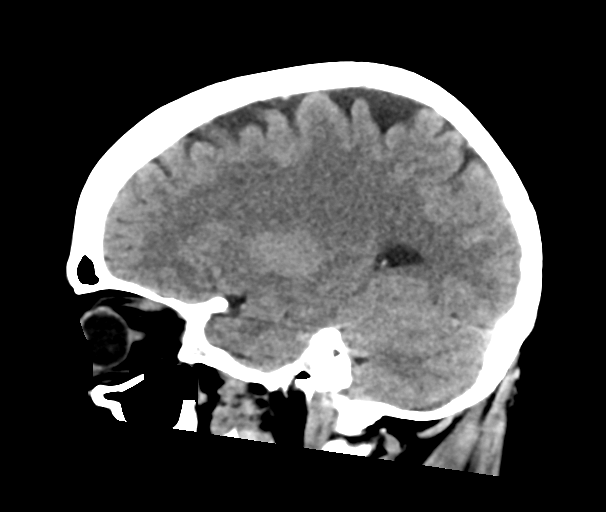

[17 of 47 positions shown; findings below may reference images not displayed]

FINDINGS: Brain: No evidence of acute infarction, hemorrhage, hydrocephalus,
extra-axial collection or mass lesion/mass effect.

Vascular: No hyperdense vessel or unexpected calcification.

Skull: Normal. Negative for fracture or focal lesion.

Sinuses/Orbits: Retention cyst in the right maxillary antrum. Small
air-fluid level in the left maxillary antrum. Changes are likely
inflammatory. Mastoid air cells are clear.

Other: None.
IMPRESSION: 1. No acute intracranial abnormalities.
2. Probable inflammatory changes in the paranasal sinuses with
air-fluid level in the left maxillary antrum, possibly acute
sinusitis.

## 2021-07-08 IMAGING — MR MR HEAD W/O CM
11 series · 47 of 48 positions shown · non-contrast
Comparison: No prior MRI of the head, correlation is made with CT
head [DATE]

CLINICAL DATA: Right-sided weakness, tingling

EXAM:
MRI HEAD WITHOUT CONTRAST
TECHNIQUE: Multiplanar, multiecho pulse sequences of the brain and surrounding
structures were obtained without intravenous contrast.

[Series 5: ax dwi_tracew · axial · 3.0mm · 0.65mm/px · z∈[-73,+81]mm · 4 of 48 slices shown]
[im 1/48]
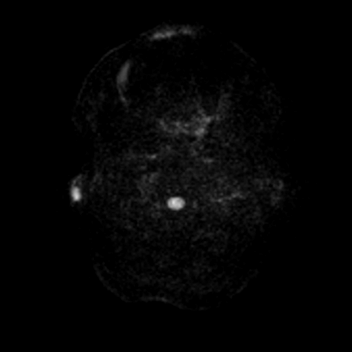
[im 16/48]
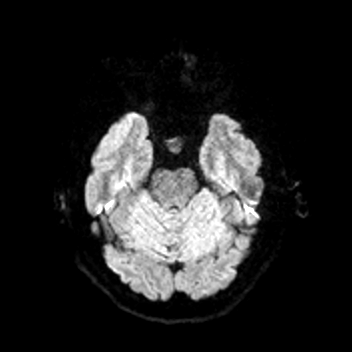
[im 32/48]
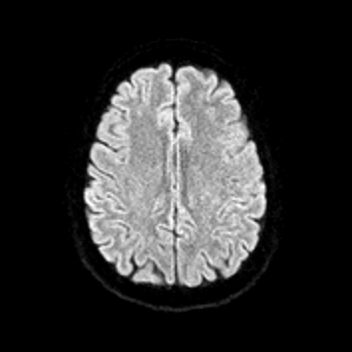
[im 48/48]
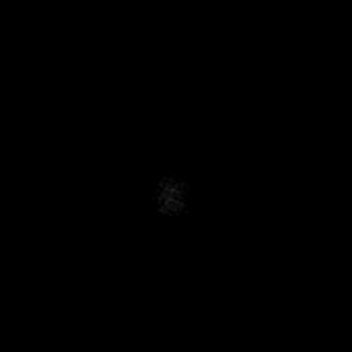

[Series 6: ax dwi_adc · axial · 3.0mm · 0.65mm/px · z∈[-73,+78]mm · 4 of 46 slices shown]
[im 1/46]
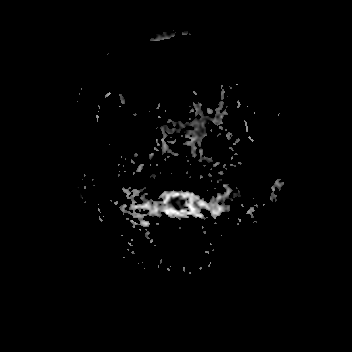
[im 16/46]
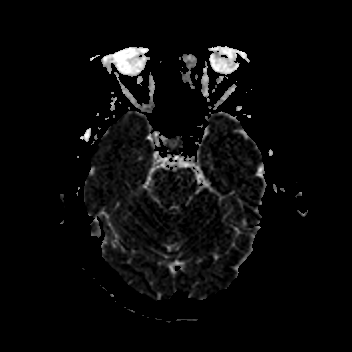
[im 31/46]
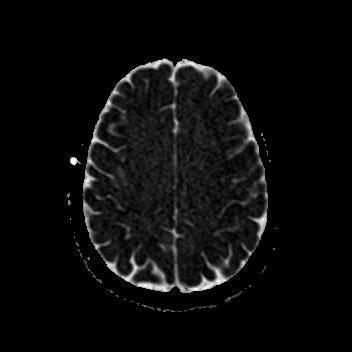
[im 46/46]
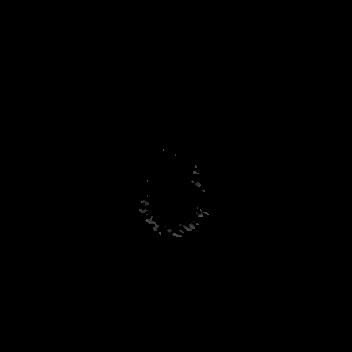

[Series 7: cor dwi_tracew · coronal · 5.0mm · 0.65mm/px · 3 of 36 slices shown]
[im 1/36]
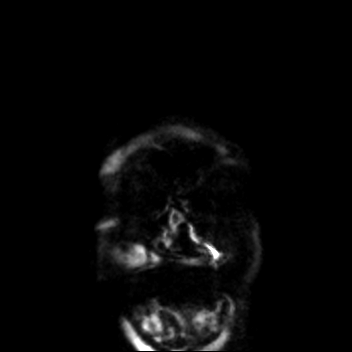
[im 18/36]
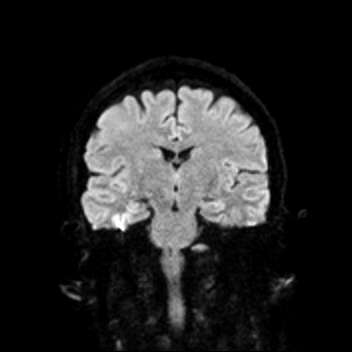
[im 36/36]
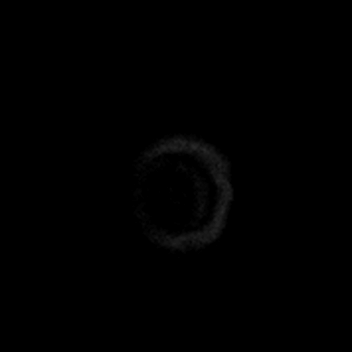

[Series 8: cor dwi_adc · coronal · 5.0mm · 0.65mm/px · 3 of 35 slices shown]
[im 1/35]
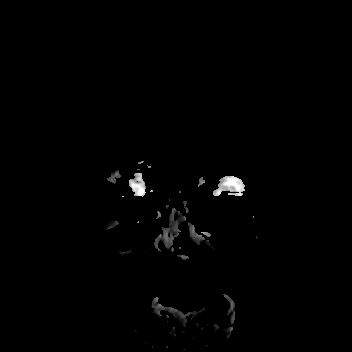
[im 18/35]
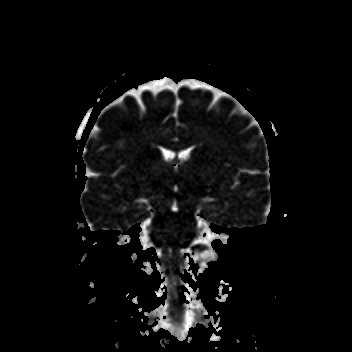
[im 35/35]
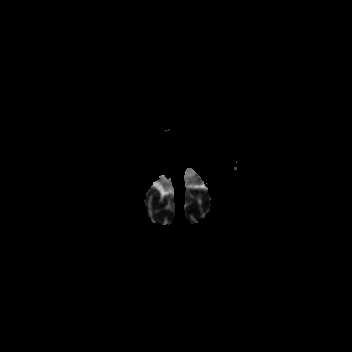

[Series 9: T1 · sagittal · 5.0mm · 0.62mm/px · 2 of 21 slices shown (1 of 2)]
[im 1/21]
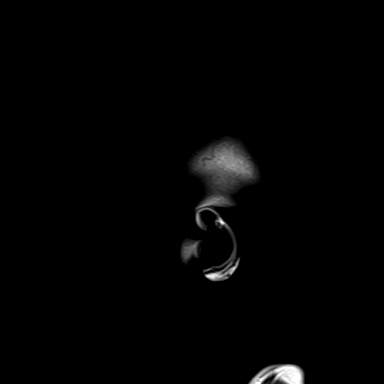
[im 21/21]
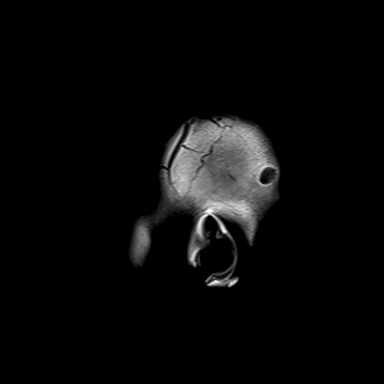

[Series 10: T2 · axial · 5.0mm · 0.53mm/px · z∈[-71,+73]mm · 2 of 25 slices shown (1 of 2)]
[im 1/25]
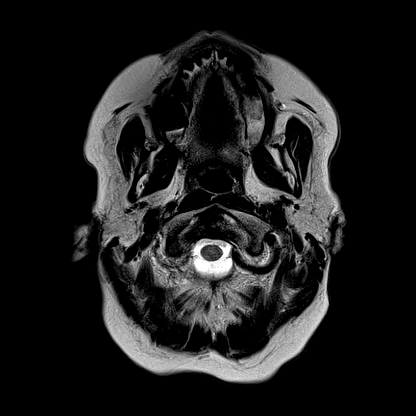
[im 25/25]
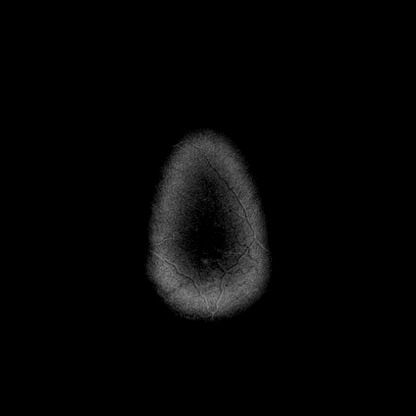

[Series 12: pha_images · axial · 3.0mm · 0.90mm/px · z∈[-87,+89]mm · 5 of 60 slices shown]
[im 1/60]
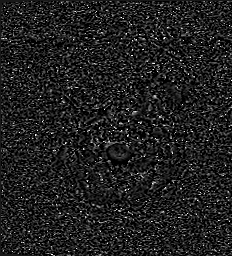
[im 15/60]
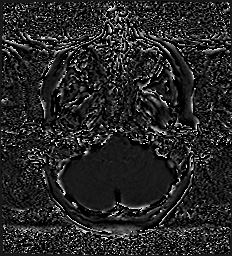
[im 30/60]
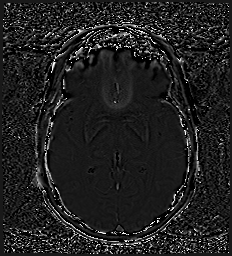
[im 45/60]
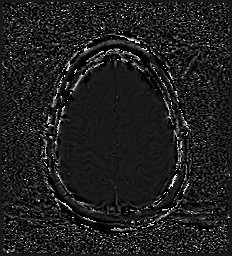
[im 60/60]
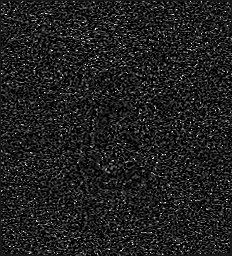

[Series 13: swi_images · axial · 3.0mm · 0.90mm/px · z∈[-87,+89]mm · 5 of 60 slices shown]
[im 1/60]
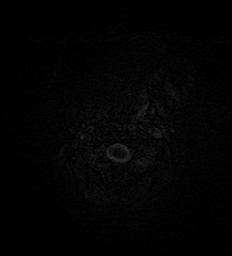
[im 15/60]
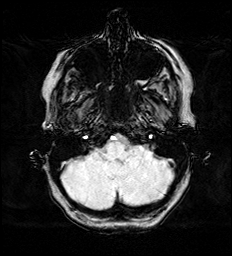
[im 30/60]
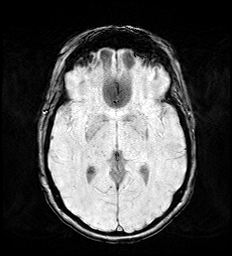
[im 45/60]
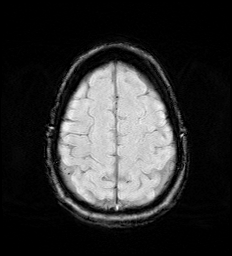
[im 60/60]
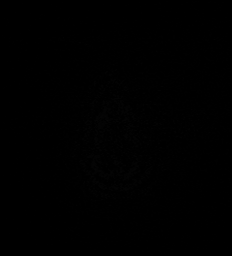

[Series 15: FLAIR · axial · 3.0mm · 0.53mm/px · z∈[-80,+82]mm · 4 of 55 slices shown]
[im 1/55]
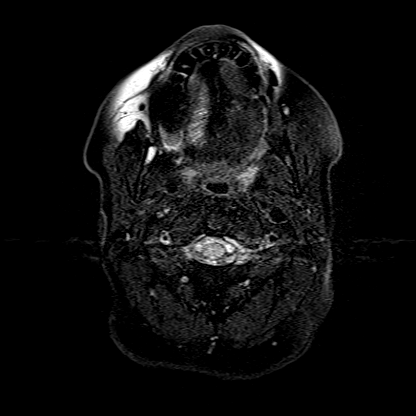
[im 19/55]
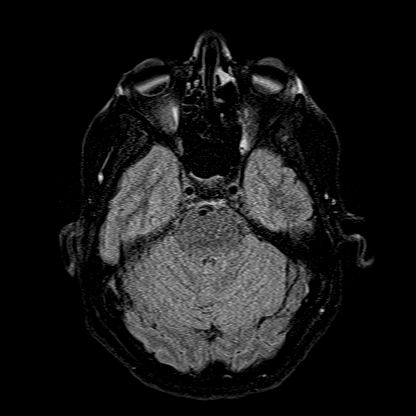
[im 37/55]
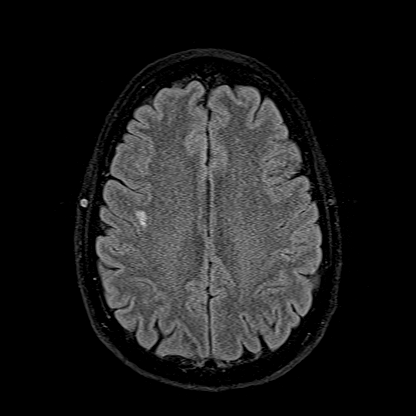
[im 55/55]
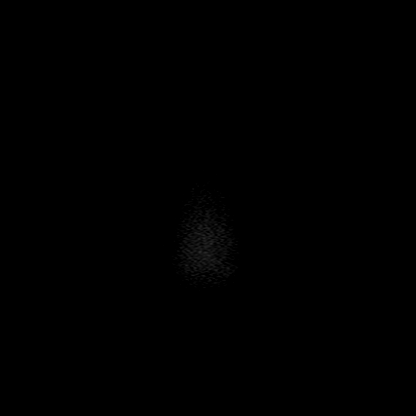

[Series 16: T1 · axial · 1.0mm · 0.98mm/px · z∈[-85,+89]mm · 13 of 175 slices shown (2 of 2)]
[im 1/175]
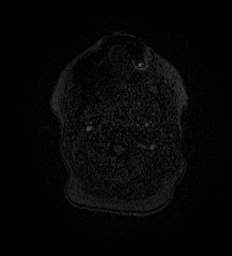
[im 14/175]
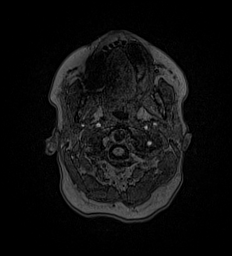
[im 27/175]
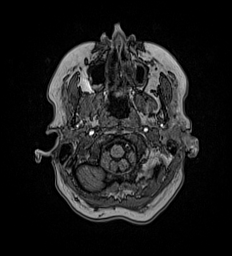
[im 41/175]
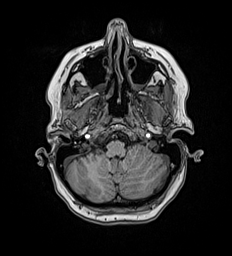
[im 54/175]
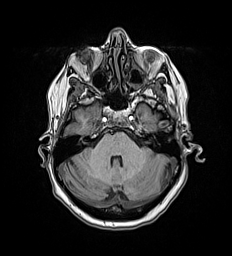
[im 67/175]
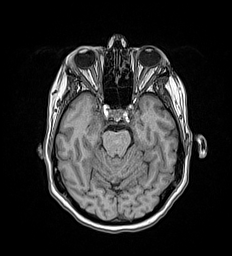
[im 81/175]
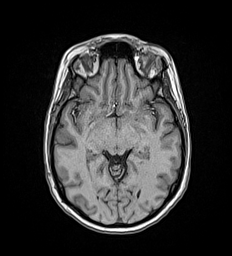
[im 94/175]
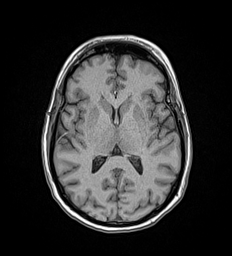
[im 108/175]
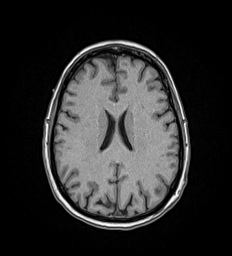
[im 121/175]
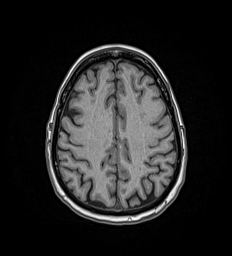
[im 134/175]
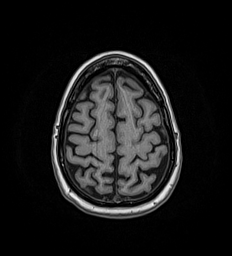
[im 148/175]
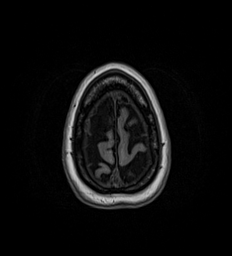
[im 175/175]
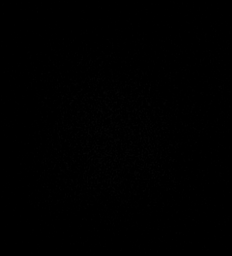

[Series 17: T2 · coronal · 5.0mm · 0.57mm/px · 2 of 29 slices shown (2 of 2)]
[im 1/29]
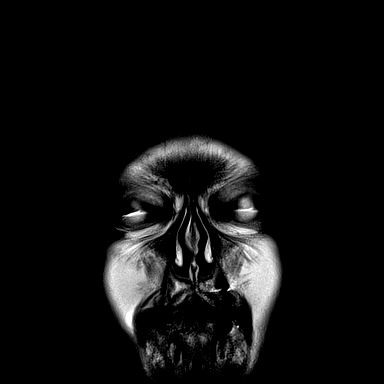
[im 29/29]
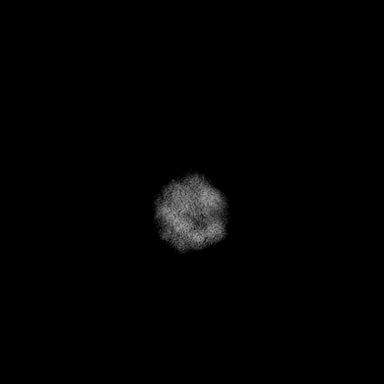

[47 of 48 positions shown; findings below may reference images not displayed]

FINDINGS: Brain: No restricted diffusion to suggest acute or subacute infarct.
No acute hemorrhage, mass, mass effect, or midline shift. No
hydrocephalus or extra-axial collection. No hemosiderin deposition
to suggest remote hemorrhage. Cerebral volume is within normal
limits for age. Scattered T2 hyperintense signal in the
periventricular white matter, likely the sequela of mild chronic
small vessel ischemic disease.

Vascular: Normal flow voids.

Skull and upper cervical spine: Normal marrow signal.

Sinuses/Orbits: Air-fluid levels in the left maxillary sinus. Mucous
retention cyst in the right maxillary sinus. Mild mucosal thickening
in the left anterior ethmoid air cells. The orbits are unremarkable.

Other: The mastoids are well aerated.
IMPRESSION: 1. No acute intracranial process. No evidence of acute or subacute
infarct.
2. Air-fluid level in the left maxillary sinus, as can be seen with
acute sinusitis. Correlate with symptoms.

## 2021-07-08 MED ORDER — AMOXICILLIN-POT CLAVULANATE 875-125 MG PO TABS: 1.0000 | ORAL_TABLET | Freq: Two times a day (BID) | ORAL | 0 refills | Status: DC

## 2021-07-08 NOTE — ED Triage Notes (Signed)
Pt has right side numbness for 3 days.   States tongue feels numb at times.  Pt reports swelling and numbness in right hand, fingers, and toes.  Pt alert.  Pt had recent mri of spine.

## 2021-07-08 NOTE — Discharge Instructions (Addendum)
Your MR brain today showed: IMPRESSION: 1. No acute intracranial process. No evidence of acute or subacute infarct. 2. Air-fluid level in the left maxillary sinus, as can be seen with acute sinusitis. Correlate with symptoms.  Your MR C spine today showed: IMPRESSION: 1. C6-C7 mild right neural foraminal narrowing appears slightly progressed compared to 03/07/2021. 2. Unchanged C5-C6 mild right neural foraminal narrowing. 3. No spinal canal stenosis.

## 2021-07-08 NOTE — ED Provider Notes (Signed)
[facility] Provider Note    Event Date/Time   First MD Initiated Contact with Patient 07/08/21 2020     (approximate)   History   Numbness   HPI  Gwendolyn Hansen is a 51 y.o. female with a past medical history of Cervicalgia with right cervical radiculopathy symptoms, status post C5-C6 ACDF in 2009 , chronic right-sided low back pain, COPD, tobacco abuse, GAD, depression, and previous urinary incontinence with frequency related to overactive bladder with patient not tolerating oxybutynin who presents for evaluation of 3 days of right-sided facial paresthesias and decree sensation on her tongue.  She states she has some chronic tingling in her right hand which is perhaps a little more than usual over the last 2 or 3 days but otherwise did not have any new weakness in the right arm.  She states that she slipped down the steps 2 days ago and fell out she thinks onto her right low back and had some pain then but then yesterday it shift in the left side today does not seem any different than usual.  She thinks she might have a little more urinary frequency today than usual.  She also notes recently completed a course of doxycycline and prednisone for sinusitis but still has some congestion.  She also states she had a little diarrhea.  No fevers, cough, chest pain vomiting or other clear acute focal extremity weakness numbness or tingling or change in her chronic paresthesias and pain.  No recent illegal drug use.       Physical Exam  Triage Vital Signs: ED Triage Vitals [07/08/21 1844]  Enc Vitals Group     BP 140/83     Pulse Rate 93     Resp 18     Temp 97.8 F (36.6 C)     Temp Source Oral     SpO2 97 %     Weight 187 lb (84.8 kg)     Height 5\' 11"  (1.803 m)     Head Circumference      Peak Flow      Pain Score 7     Pain Loc      Pain Edu?      Excl. in GC?     Most recent vital signs: Vitals:   07/08/21 1844  BP: 140/83  Pulse: 93  Resp:  18  Temp: 97.8 F (36.6 C)  SpO2: 97%    General: Awake, no distress.  CV:  Good peripheral perfusion.  2+ radial pulses. Resp:  Normal effort.  Clear bilaterally. Abd:  No distention.  Soft. Other:  No pronator drift or finger dysmetria.  Patient has symmetric strength in upper and lower extremities.  She states it feels different when this examiner is touching her right upper extremity in the distribution in the hand of the ulnar median and radial nerves compared to the left upper extremity.  She also states that the right side of her face feels different and that she feels "less" than sensation to the left face to light touch but otherwise cranial nerves II 12 are grossly intact.   ED Results / Procedures / Treatments  Labs (all labs ordered are listed, but only abnormal results are displayed) Labs Reviewed  COMPREHENSIVE METABOLIC PANEL - Abnormal; Notable for the following components:      Result Value   Creatinine, Ser 1.01 (*)    All other components within normal limits  URINALYSIS, COMPLETE (UACMP) WITH MICROSCOPIC - Abnormal; Notable  for the following components:   Color, Urine YELLOW (*)    APPearance CLEAR (*)    All other components within normal limits  PROTIME-INR  APTT  CBC  DIFFERENTIAL  ETHANOL  CBG MONITORING, ED     EKG  ECG is marked for sinus rhythm with a ventricular rate of 92, right axis deviation, incomplete right bundle branch block and nonspecific ST change in V2 without other clear evidence of acute ischemia or significant arrhythmia   RADIOLOGY CT head without contrast my interpretation without evidence of ischemia, edema, mass effect, hemorrhage or other clear acute intracranial process.  I reviewed radiology interpretation and agree to findings of possible sinusitis.  MR brain on my interpretation not evidence of ischemia, hemorrhage, edema or mass effect or other clear intracranial process.  I reviewed radiology interpretation and agree to  findings of some air-fluid levels in left maxillary sinus consistent with sinusitis.  MR C-spine my interpretation without evidence of clear fracture or dislocation.  I reviewed radiology interpretation and agree to findings of C6-C7 right neuroforaminal foraminal narrowing progressed from February as well as unchanged C5-C6 right neural foraminal narrowing without significant spinal stenosis.   PROCEDURES:  Critical Care performed: No  Procedures   MEDICATIONS ORDERED IN ED: Medications - No data to display   IMPRESSION / MDM / ASSESSMENT AND PLAN / ED COURSE  I reviewed the triage vital signs and the nursing notes. Patient's presentation is most consistent with acute presentation with potential threat to life or bodily function.                               Differential diagnosis includes, but is not limited to CVA, atypical migraine, arrhythmia, metabolic derangements, cystitis.   CMP without any significant electrolyte or metabolic derangements.  Serum ethanol undetectable.  CBC without leukocytosis or acute anemia.  Coagulation studies unremarkable.  UA without any evidence of urinary tract infection.  ECG is marked for sinus rhythm with a ventricular rate of 92, right axis deviation, incomplete right bundle branch block and nonspecific ST change in V2 without other clear evidence of acute ischemia or significant arrhythmia.  CT head without contrast my interpretation without evidence of ischemia, edema, mass effect, hemorrhage or other clear acute intracranial process.  I reviewed radiology interpretation and agree to findings of possible sinusitis.  MR brain on my interpretation not evidence of ischemia, hemorrhage, edema or mass effect or other clear intracranial process.  I reviewed radiology interpretation and agree to findings of some air-fluid levels in left maxillary sinus consistent with sinusitis.  MR C-spine my interpretation without evidence of clear fracture or  dislocation.  I reviewed radiology interpretation and agree to findings of C6-C7 right neuroforaminal foraminal narrowing progressed from February as well as unchanged C5-C6 right neural foraminal narrowing without significant spinal stenosis.  Unclear etiology for patient's right-sided facial paresthesias there is no rash or any other features to suggest acute infectious process.  I think patient is appropriate for continued outpatient evaluation with neurology and PCP.  It does seem that she has some progression of her cervical stenosis on cervical spine imaging and discussed with patient and recommend she follow-up with orthopedic surgeon she was seen in the past for this.  She is agreeable to plan.  Unclear cause of her increased reported urinary frequency although seems to be a chronic issue for the patient due to some bladder spasming.  UA is not infected.  She is stable for continued outpatient evaluation with regard to this.  Discharged in stable condition for strict return precautions advised and discussed.  Rx for Augmentin written to treat her sinusitis which did not seem to respond to the doxycycline.       FINAL CLINICAL IMPRESSION(S) / ED DIAGNOSES   Final diagnoses:  Paresthesia  Acute sinusitis, recurrence not specified, unspecified location  Urinary frequency  Foraminal stenosis of cervical region     Rx / DC Orders   ED Discharge Orders          Ordered    amoxicillin-clavulanate (AUGMENTIN) 875-125 MG tablet  2 times daily        07/08/21 2235             Note:  This document was prepared using Dragon voice recognition software and may include unintentional dictation errors.   Gilles Chiquito, MD 07/08/21 2256

## 2021-07-08 NOTE — Telephone Encounter (Signed)
Lvm advising pt to call us back

## 2021-07-11 ENCOUNTER — Encounter: Payer: Self-pay | Admitting: Family Medicine

## 2021-07-11 ENCOUNTER — Ambulatory Visit (INDEPENDENT_AMBULATORY_CARE_PROVIDER_SITE_OTHER): Payer: 59 | Admitting: Family Medicine

## 2021-07-11 VITALS — BP 113/75 | HR 73 | Temp 98.1°F | Wt 198.6 lb

## 2021-07-11 DIAGNOSIS — E785 Hyperlipidemia, unspecified: Secondary | ICD-10-CM | POA: Insufficient documentation

## 2021-07-11 DIAGNOSIS — M4722 Other spondylosis with radiculopathy, cervical region: Secondary | ICD-10-CM | POA: Diagnosis not present

## 2021-07-11 DIAGNOSIS — J449 Chronic obstructive pulmonary disease, unspecified: Secondary | ICD-10-CM

## 2021-07-11 DIAGNOSIS — M51369 Other intervertebral disc degeneration, lumbar region without mention of lumbar back pain or lower extremity pain: Secondary | ICD-10-CM

## 2021-07-11 DIAGNOSIS — F19950 Other psychoactive substance use, unspecified with psychoactive substance-induced psychotic disorder with delusions: Secondary | ICD-10-CM

## 2021-07-11 DIAGNOSIS — F23 Brief psychotic disorder: Secondary | ICD-10-CM

## 2021-07-11 DIAGNOSIS — R202 Paresthesia of skin: Secondary | ICD-10-CM

## 2021-07-11 DIAGNOSIS — F411 Generalized anxiety disorder: Secondary | ICD-10-CM

## 2021-07-11 DIAGNOSIS — E782 Mixed hyperlipidemia: Secondary | ICD-10-CM

## 2021-07-11 DIAGNOSIS — F1120 Opioid dependence, uncomplicated: Secondary | ICD-10-CM

## 2021-07-11 DIAGNOSIS — I10 Essential (primary) hypertension: Secondary | ICD-10-CM

## 2021-07-11 DIAGNOSIS — F331 Major depressive disorder, recurrent, moderate: Secondary | ICD-10-CM

## 2021-07-11 DIAGNOSIS — J329 Chronic sinusitis, unspecified: Secondary | ICD-10-CM | POA: Diagnosis not present

## 2021-07-11 DIAGNOSIS — F15959 Other stimulant use, unspecified with stimulant-induced psychotic disorder, unspecified: Secondary | ICD-10-CM

## 2021-07-11 DIAGNOSIS — M5136 Other intervertebral disc degeneration, lumbar region: Secondary | ICD-10-CM

## 2021-07-11 DIAGNOSIS — F1520 Other stimulant dependence, uncomplicated: Secondary | ICD-10-CM

## 2021-07-11 MED ORDER — MONTELUKAST SODIUM 10 MG PO TABS: 10.0000 mg | ORAL_TABLET | Freq: Every day | ORAL | 0 refills | Status: DC

## 2021-07-11 MED ORDER — LISINOPRIL 20 MG PO TABS
20.0000 mg | ORAL_TABLET | Freq: Every day | ORAL | 0 refills | Status: DC
Start: 2021-07-11 — End: 2021-09-25

## 2021-07-11 MED ORDER — AMITRIPTYLINE HCL 150 MG PO TABS
ORAL_TABLET | ORAL | 0 refills | Status: DC
Start: 2021-07-11 — End: 2021-10-14

## 2021-07-11 MED ORDER — GABAPENTIN 300 MG PO CAPS
300.0000 mg | ORAL_CAPSULE | Freq: Three times a day (TID) | ORAL | 3 refills | Status: DC
Start: 2021-07-11 — End: 2022-03-25

## 2021-07-11 MED ORDER — ALBUTEROL SULFATE HFA 108 (90 BASE) MCG/ACT IN AERS
2.0000 | INHALATION_SPRAY | Freq: Four times a day (QID) | RESPIRATORY_TRACT | 3 refills | Status: DC | PRN
Start: 2021-07-11 — End: 2022-03-25

## 2021-07-11 MED ORDER — ATORVASTATIN CALCIUM 20 MG PO TABS
20.0000 mg | ORAL_TABLET | Freq: Every day | ORAL | 0 refills | Status: DC
Start: 2021-07-11 — End: 2021-10-14

## 2021-07-11 MED ORDER — SPIRIVA HANDIHALER 18 MCG IN CAPS
ORAL_CAPSULE | RESPIRATORY_TRACT | 1 refills | Status: DC
Start: 2021-07-11 — End: 2022-03-25

## 2021-07-11 NOTE — Assessment & Plan Note (Signed)
Under good control on current regimen. Continue current regimen. Continue to monitor. Call with any concerns. Refills given.   

## 2021-07-11 NOTE — Assessment & Plan Note (Signed)
States that she is not using subutex, but last Rx was filled 06/25/21. Establish with psychiatry as scheduled. Continue to follow with ortho. Referral to neurology placed.

## 2021-07-11 NOTE — Assessment & Plan Note (Signed)
Not delusional today. Due to see psychiatry next month. Encouraged follow up with them.

## 2021-07-11 NOTE — Assessment & Plan Note (Signed)
Continue to follow with ortho. Will increase her gabapentin to 300mg TID and cut her amitriptyline back to 100mg. Continue to monitor. Call with any concerns. Referral to neurology placed today.  

## 2021-07-11 NOTE — Assessment & Plan Note (Signed)
Under good control on current regimen. Continue current regimen. Continue to monitor. Call with any concerns. Refills given. CMP checked at ER 3 days ago and normal.

## 2021-07-11 NOTE — Assessment & Plan Note (Signed)
Continue to follow with ortho. Will increase her gabapentin to 300mg  TID and cut her amitriptyline back to 100mg . Continue to monitor. Call with any concerns. Referral to neurology placed today.

## 2021-07-11 NOTE — Assessment & Plan Note (Signed)
Discussed that given her recent fill of subutex, even if she states that she is not taking it, I'm not comfortable bridging her valium. Encouraged her to reach out to current prescriber to bridge her until she sees psychiatry. Call with any concerns. Continue to monitor.

## 2021-07-11 NOTE — Assessment & Plan Note (Signed)
Not delusional today. Due to see psychiatry next month. Encouraged follow up with them.  

## 2021-07-11 NOTE — Assessment & Plan Note (Signed)
Discussed that given her recent fill of subutex, even if she states that she is not taking it, I'm not comfortable bridging her valium. Encouraged her to reach out to current prescriber to bridge her until she sees psychiatry. Call with any concerns. Continue to monitor.  

## 2021-07-11 NOTE — Assessment & Plan Note (Signed)
Under good control on current regimen. Continue current regimen. Continue to monitor. Call with any concerns. Refills given. Cholesterol checked in February.

## 2021-07-24 ENCOUNTER — Ambulatory Visit: Payer: 59 | Admitting: Orthopaedic Surgery

## 2021-07-24 VITALS — BP 112/73 | HR 79

## 2021-07-24 DIAGNOSIS — M542 Cervicalgia: Secondary | ICD-10-CM

## 2021-07-24 NOTE — Progress Notes (Unsigned)
   Office Visit Note   Patient: Gwendolyn Hansen           Date of Birth: 09-Jan-1971           MRN: [id number] Visit Date: 07/24/2021              Requested by: Loura Pardon, MD [address] [place],  [place] [address] PCP: Loura Pardon, MD   Assessment & Plan: Visit Diagnoses: No diagnosis found.  Plan: ***  Follow-Up Instructions: No follow-ups on file.   Orders:  No orders of the defined types were placed in this encounter.  No orders of the defined types were placed in this encounter.     Procedures: No procedures performed   Clinical Data: No additional findings.   Subjective: No chief complaint on file.   HPI  Review of Systems   Objective: Vital Signs: BP 112/73   Pulse 79   LMP  (LMP Unknown)   Physical Exam  Ortho Exam  Specialty Comments:  No specialty comments available.  Imaging: No results found.   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   Tobacco use disorder 08/18/2014   GAD (generalized anxiety disorder) 08/17/2014   Depression 08/17/2014   Opioid use disorder, severe, in controlled environment, dependence (HCC) 08/17/2014   History of alcohol dependence (HCC) 08/17/2014   Past Medical History:  Diagnosis Date   Allergy    Anxiety    Asthma     Family History  Problem Relation Age of Onset   COPD Mother    Anxiety disorder Mother    Emphysema Father    Alcohol abuse Father    Anxiety disorder  Father    Leukemia Father    Anxiety disorder Sister    Bipolar disorder Sister    Colon cancer Maternal Grandmother     Past Surgical History:  Procedure Laterality Date   ANKLE SURGERY Right 1999   BREAST ENHANCEMENT SURGERY     NECK SURGERY  2011   Social History   Occupational History   Not on file  Tobacco Use   Smoking status: Every Day    Packs/day: 2.00    Years: 28.00    Total pack years: 56.00    Types: Cigarettes   Smokeless tobacco: Never   Tobacco comments:    1ppd-11/13/2020  Vaping Use   Vaping Use: Some days   Substances: Nicotine, Flavoring  Substance and Sexual Activity   Alcohol use: No    Alcohol/week: 0.0 standard drinks of alcohol    Comment: quit drinking in 2010   Drug use: No   Sexual activity: Yes

## 2021-07-31 ENCOUNTER — Other Ambulatory Visit: Payer: Self-pay | Admitting: Family Medicine

## 2021-08-01 NOTE — Telephone Encounter (Signed)
Called Pharmacy and they have the requested refill there, they are filling again for her to pick up

## 2021-08-20 ENCOUNTER — Encounter: Payer: 59 | Admitting: Unknown Physician Specialty

## 2021-08-27 ENCOUNTER — Encounter: Payer: 59 | Admitting: Unknown Physician Specialty

## 2021-08-29 ENCOUNTER — Other Ambulatory Visit: Payer: Self-pay

## 2021-08-29 NOTE — Telephone Encounter (Signed)
LOV 07/11/21  Future appt 09/03/21 with Dr. Shela Commons

## 2021-09-03 ENCOUNTER — Encounter: Payer: 59 | Admitting: Family Medicine

## 2021-09-06 ENCOUNTER — Encounter: Payer: Self-pay | Admitting: Internal Medicine

## 2021-09-24 NOTE — Progress Notes (Unsigned)
LMP  (LMP Unknown)    Subjective:    Patient ID: Gwendolyn Hansen, female    DOB: August 15, 1970, 51 y.o.   MRN: [id number]  HPI: Gwendolyn Hansen is a 51 y.o. female  No chief complaint on file.  UPPER RESPIRATORY TRACT INFECTION Worst symptom: Fever: {Blank single:19197::"yes","no"} Cough: {Blank single:19197::"yes","no"} Shortness of breath: {Blank single:19197::"yes","no"} Wheezing: {Blank single:19197::"yes","no"} Chest pain: {Blank single:19197::"yes","no","yes, with cough"} Chest tightness: {Blank single:19197::"yes","no"} Chest congestion: {Blank single:19197::"yes","no"} Nasal congestion: {Blank single:19197::"yes","no"} Runny nose: {Blank single:19197::"yes","no"} Post nasal drip: {Blank single:19197::"yes","no"} Sneezing: {Blank single:19197::"yes","no"} Sore throat: {Blank single:19197::"yes","no"} Swollen glands: {Blank single:19197::"yes","no"} Sinus pressure: {Blank single:19197::"yes","no"} Headache: {Blank single:19197::"yes","no"} Face pain: {Blank single:19197::"yes","no"} Toothache: {Blank single:19197::"yes","no"} Ear pain: {Blank single:19197::"yes","no"} {Blank single:19197::""right","left", "bilateral"} Ear pressure: {Blank single:19197::"yes","no"} {Blank single:19197::""right","left", "bilateral"} Eyes red/itching:{Blank single:19197::"yes","no"} Eye drainage/crusting: {Blank single:19197::"yes","no"}  Vomiting: {Blank single:19197::"yes","no"} Rash: {Blank single:19197::"yes","no"} Fatigue: {Blank single:19197::"yes","no"} Sick contacts: {Blank single:19197::"yes","no"} Strep contacts: {Blank single:19197::"yes","no"}  Context: {Blank multiple:19196::"better","worse","stable","fluctuating"} Recurrent sinusitis: {Blank single:19197::"yes","no"} Relief with OTC cold/cough medications: {Blank single:19197::"yes","no"}  Treatments attempted: {Blank multiple:19196::"none","cold/sinus","mucinex","anti-histamine","pseudoephedrine","cough  syrup","antibiotics"}    Relevant past medical, surgical, family and social history reviewed and updated as indicated. Interim medical history since our last visit reviewed. Allergies and medications reviewed and updated.  Review of Systems  Per HPI unless specifically indicated above     Objective:    LMP  (LMP Unknown)   Wt Readings from Last 3 Encounters:  07/11/21 198 lb 9.6 oz (90.1 kg)  07/08/21 187 lb (84.8 kg)  05/22/21 189 lb (85.7 kg)    Physical Exam  Results for orders placed or performed during the hospital encounter of 07/08/21  Protime-INR  Result Value Ref Range   Prothrombin Time 12.6 11.4 - 15.2 seconds   INR 1.0 0.8 - 1.2  APTT  Result Value Ref Range   aPTT 30 24 - 36 seconds  CBC  Result Value Ref Range   WBC 7.4 4.0 - 10.5 K/uL   RBC 4.68 3.87 - 5.11 MIL/uL   Hemoglobin 14.1 12.0 - 15.0 g/dL   HCT 67.6 19.5 - 09.3 %   MCV 93.2 80.0 - 100.0 fL   MCH 30.1 26.0 - 34.0 pg   MCHC 32.3 30.0 - 36.0 g/dL   RDW 26.7 12.4 - 58.0 %   Platelets 297 150 - 400 K/uL   nRBC 0.0 0.0 - 0.2 %  Differential  Result Value Ref Range   Neutrophils Relative % 59 %   Neutro Abs 4.3 1.7 - 7.7 K/uL   Lymphocytes Relative 30 %   Lymphs Abs 2.2 0.7 - 4.0 K/uL   Monocytes Relative 7 %   Monocytes Absolute 0.5 0.1 - 1.0 K/uL   Eosinophils Relative 3 %   Eosinophils Absolute 0.2 0.0 - 0.5 K/uL   Basophils Relative 1 %   Basophils Absolute 0.1 0.0 - 0.1 K/uL   Immature Granulocytes 0 %   Abs Immature Granulocytes 0.02 0.00 - 0.07 K/uL  Comprehensive metabolic panel  Result Value Ref Range   Sodium 139 135 - 145 mmol/L   Potassium 4.2 3.5 - 5.1 mmol/L   Chloride 104 98 - 111 mmol/L   CO2 29 22 - 32 mmol/L   Glucose, Bld 71 70 - 99 mg/dL   BUN 18 6 - 20 mg/dL   Creatinine, Ser 9.98 (H) 0.44 - 1.00 mg/dL   Calcium 9.7 8.9 - 33.8 mg/dL   Total Protein 7.0 6.5 - 8.1 g/dL   Albumin 4.4 3.5 - 5.0 g/dL   AST 19 15 -  41 U/L   ALT 16 0 - 44 U/L   Alkaline Phosphatase  43 38 - 126 U/L   Total Bilirubin 0.6 0.3 - 1.2 mg/dL   GFR, Estimated >16 >10 mL/min   Anion gap 6 5 - 15  Ethanol  Result Value Ref Range   Alcohol, Ethyl (B) <10 <10 mg/dL  Urinalysis, Complete w Microscopic  Result Value Ref Range   Color, Urine YELLOW (A) YELLOW   APPearance CLEAR (A) CLEAR   Specific Gravity, Urine 1.009 1.005 - 1.030   pH 5.0 5.0 - 8.0   Glucose, UA NEGATIVE NEGATIVE mg/dL   Hgb urine dipstick NEGATIVE NEGATIVE   Bilirubin Urine NEGATIVE NEGATIVE   Ketones, ur NEGATIVE NEGATIVE mg/dL   Protein, ur NEGATIVE NEGATIVE mg/dL   Nitrite NEGATIVE NEGATIVE   Leukocytes,Ua NEGATIVE NEGATIVE   RBC / HPF 0-5 0 - 5 RBC/hpf   WBC, UA 0-5 0 - 5 WBC/hpf   Bacteria, UA NONE SEEN NONE SEEN   Squamous Epithelial / LPF 0-5 0 - 5   Mucus PRESENT       Assessment & Plan:   Problem List Items Addressed This Visit   None    Follow up plan: No follow-ups on file.   This visit was completed via MyChart due to the restrictions of the COVID-19 pandemic. All issues as above were discussed and addressed. Physical exam was done as above through visual confirmation on MyChart. If it was felt that the patient should be evaluated in the office, they were directed there. The patient verbally consented to this visit. Location of the patient: *** Location of the provider: Office Those involved with this call:  Provider: Larae Grooms, NP CMA: *** Front Desk/Registration: *** This encounter was conducted via ***.  I spent *** dedicated to the care of this patient on the date of this encounter to include previsit review of ***, face to face time with the patient, and post visit ordering of testing.

## 2021-09-25 ENCOUNTER — Telehealth (INDEPENDENT_AMBULATORY_CARE_PROVIDER_SITE_OTHER): Payer: Self-pay | Admitting: Nurse Practitioner

## 2021-09-25 ENCOUNTER — Encounter: Payer: Self-pay | Admitting: Nurse Practitioner

## 2021-09-25 DIAGNOSIS — J019 Acute sinusitis, unspecified: Secondary | ICD-10-CM

## 2021-09-25 MED ORDER — AMOXICILLIN 500 MG PO CAPS: 500.0000 mg | ORAL_CAPSULE | Freq: Two times a day (BID) | ORAL | 0 refills | Status: AC

## 2021-09-25 MED ORDER — LISINOPRIL 20 MG PO TABS
20.0000 mg | ORAL_TABLET | Freq: Every day | ORAL | 0 refills | Status: DC
Start: 2021-09-25 — End: 2022-03-25

## 2021-09-25 MED ORDER — HYDROXYZINE PAMOATE 25 MG PO CAPS: ORAL_CAPSULE | ORAL | 0 refills | Status: DC

## 2021-09-25 NOTE — Progress Notes (Signed)
LVM asking patient to call back to schedule her follow up appt 

## 2021-10-12 ENCOUNTER — Other Ambulatory Visit: Payer: Self-pay | Admitting: Nurse Practitioner

## 2021-10-12 ENCOUNTER — Other Ambulatory Visit: Payer: Self-pay | Admitting: Family Medicine

## 2021-10-14 MED ORDER — AMITRIPTYLINE HCL 150 MG PO TABS: ORAL_TABLET | ORAL | 0 refills | Status: DC

## 2021-10-14 MED ORDER — ATORVASTATIN CALCIUM 20 MG PO TABS: 20.0000 mg | ORAL_TABLET | Freq: Every day | ORAL | 0 refills | Status: DC

## 2021-10-14 NOTE — Telephone Encounter (Signed)
Medication refill for Amitriptyline 150mg  , atorvastatin 20mg   last ov 09/25/21, upcoming ov 10/16/21 . Please advise

## 2021-10-14 NOTE — Telephone Encounter (Signed)
Medication refill for Stiolto  last ov 09/25/21, upcoming ov 10/16/21 . Please advise

## 2021-10-16 ENCOUNTER — Encounter: Payer: Self-pay | Admitting: Nurse Practitioner

## 2021-10-16 NOTE — Progress Notes (Deleted)
LMP  (LMP Unknown)    Subjective:    Patient ID: Gwendolyn Hansen, female    DOB: 1970/12/14, 51 y.o.   MRN: [id number]  HPI: Gwendolyn Hansen is a 51 y.o. female  No chief complaint on file.  ER FOLLOW UP Time since discharge: 3 days Hospital/facility: [facility] Diagnosis: facial paresthesias Procedures/tests: CLINICAL DATA:  Acute neurological deficit with stroke suspected. Right-sided numbness for 3 days.   EXAM: CT HEAD WITHOUT CONTRAST   TECHNIQUE: Contiguous axial images were obtained from the base of the skull through the vertex without intravenous contrast.   RADIATION DOSE REDUCTION: This exam was performed according to the departmental dose-optimization program which includes automated exposure control, adjustment of the mA and/or kV according to patient size and/or use of iterative reconstruction technique.   COMPARISON:  11/19/2020   FINDINGS: Brain: No evidence of acute infarction, hemorrhage, hydrocephalus, extra-axial collection or mass lesion/mass effect.   Vascular: No hyperdense vessel or unexpected calcification.   Skull: Normal. Negative for fracture or focal lesion.   Sinuses/Orbits: Retention cyst in the right maxillary antrum. Small air-fluid level in the left maxillary antrum. Changes are likely inflammatory. Mastoid air cells are clear.   Other: None.   IMPRESSION: 1. No acute intracranial abnormalities. 2. Probable inflammatory changes in the paranasal sinuses with air-fluid level in the left maxillary antrum, possibly acute sinusitis.   CLINICAL DATA:  Acute neurological deficit with stroke suspected. Right-sided numbness for 3 days.   EXAM: CT HEAD WITHOUT CONTRAST   TECHNIQUE: Contiguous axial images were obtained from the base of the skull through the vertex without intravenous contrast.   RADIATION DOSE REDUCTION: This exam was performed according to the departmental dose-optimization program which includes  automated exposure control, adjustment of the mA and/or kV according to patient size and/or use of iterative reconstruction technique.   COMPARISON:  11/19/2020   FINDINGS: Brain: No evidence of acute infarction, hemorrhage, hydrocephalus, extra-axial collection or mass lesion/mass effect.   Vascular: No hyperdense vessel or unexpected calcification.   Skull: Normal. Negative for fracture or focal lesion.   Sinuses/Orbits: Retention cyst in the right maxillary antrum. Small air-fluid level in the left maxillary antrum. Changes are likely inflammatory. Mastoid air cells are clear.   Other: None.   IMPRESSION: 1. No acute intracranial abnormalities. 2. Probable inflammatory changes in the paranasal sinuses with air-fluid level in the left maxillary antrum, possibly acute sinusitis.  CLINICAL DATA:  Right-sided weakness, neck pain prior fusion surgery   EXAM: MRI CERVICAL SPINE WITHOUT CONTRAST   TECHNIQUE: Multiplanar, multisequence MR imaging of the cervical spine was performed. No intravenous contrast was administered.   COMPARISON:  03/07/2021   FINDINGS: Alignment: Unchanged mild anterolisthesis of C5 on C6.   Vertebrae: No acute fracture or suspicious osseous lesion. C5-C6 ACDF with solid osseous fusion across the disc space.   Cord: Normal signal and morphology.   Posterior Fossa, vertebral arteries, paraspinal tissues: Negative.   Disc levels:   C2-C3: No significant disc bulge. Left facet arthropathy. No spinal canal stenosis or neuroforaminal narrowing.   C3-C4: Small central disc protrusion. Left facet arthropathy. No spinal canal stenosis or neural foraminal narrowing.   C4-C5: Minimal disc bulge. No spinal canal stenosis or neural foraminal narrowing.   C5-C6: Status post ACDF. No spinal canal stenosis. Facet and uncovertebral hypertrophy. Mild right neural foraminal narrowing, unchanged.   C6-C7: No significant disc bulge.  Right-greater-than-left uncovertebral hypertrophy. No spinal canal stenosis. Mild right neural foraminal narrowing, which appears slightly  progressed from the prior exam.   C7-T1: Minimal central disc protrusion. No spinal canal stenosis or neural foraminal narrowing.   IMPRESSION: 1. C6-C7 mild right neural foraminal narrowing appears slightly progressed compared to 03/07/2021. 2. Unchanged C5-C6 mild right neural foraminal narrowing. 3. No spinal canal stenosis.   Consultants: None New medications: augmentin Discharge instructions:  Follow up here Status: stable  ANXIETY/DEPRESSION- due to see psychiatry in 1 month. Got a refill of her subutex and her valium from her prior PCP in [facility] at the beginning of the month Duration: chronic Status:uncontrolled Anxious mood: yes  Excessive worrying: yes Irritability: no  Sweating: no Nausea: no Palpitations:no Hyperventilation: no Panic attacks: yes Agoraphobia: no  Obscessions/compulsions: no Depressed mood: no    07/11/2021    4:20 PM 04/08/2021    1:11 PM 03/12/2021    9:39 AM 12/10/2020    3:41 PM 11/12/2020   11:42 AM  Depression screen PHQ 2/9  Decreased Interest 0 0 1 0 2  Down, Depressed, Hopeless 0 0 0 0 1  PHQ - 2 Score 0 0 1 0 3  Altered sleeping 1 0 0 2 1  Tired, decreased energy 1 0 1 2 0  Change in appetite 1 0 0 3 0  Feeling bad or failure about yourself  0 0 0 3 2  Trouble concentrating 1 0 1 3 1   Moving slowly or fidgety/restless 2 0 1 3 0  Suicidal thoughts 0 0 0 0   PHQ-9 Score 6 0 4 16 7   Difficult doing work/chores Very difficult Not difficult at all Very difficult Extremely dIfficult       07/11/2021    4:20 PM 04/08/2021    1:11 PM 03/12/2021    9:40 AM 12/10/2020    3:42 PM  GAD 7 : Generalized Anxiety Score  Nervous, Anxious, on Edge 3 1 1 3   Control/stop worrying 2 1 1 3   Worry too much - different things 3 1 1 3   Trouble relaxing  1 1 3   Restless 2 1 1 3   Easily annoyed or irritable   1 1 3   Afraid - awful might happen 1 1 1 3   Total GAD 7 Score  7 7 21   Anxiety Difficulty Somewhat difficult Not difficult at all Very difficult Extremely difficult   Anhedonia: no Weight changes: no Insomnia: no   Hypersomnia: no Fatigue/loss of energy: yes Feelings of worthlessness: no Feelings of guilt: no Impaired concentration/indecisiveness: no Suicidal ideations: no  Crying spells: no Recent Stressors/Life Changes: yes   Relationship problems: no   Family stress: no     Financial stress: no    Job stress: no    Recent death/loss: no  HYPERTENSION / HYPERLIPIDEMIA Satisfied with current treatment? yes Duration of hypertension: chronic BP monitoring frequency: not checking BP medication side effects: no Past BP meds: lisinopril Duration of hyperlipidemia: chronic Cholesterol medication side effects: no Cholesterol supplements: none Past cholesterol medications: simvastatin Medication compliance: good compliance Aspirin: no Recent stressors: yes Recurrent headaches: no Visual changes: no Palpitations: no Dyspnea: no Chest pain: no Lower extremity edema: no Dizzy/lightheaded: no  COPD COPD status: controlled Satisfied with current treatment?: yes Oxygen use: no Dyspnea frequency: occasionally  Cough frequency: occasionally Rescue inhaler frequency:  occasionally Limitation of activity: no Pneumovax: Not up to Date Influenza: Up to Date  Has been seeing ortho in [facility] for issues with her back and neck and numbness of her R leg and arm. She had been seeing neurology,  but her neurologist was not in network and she had to stop seeing him. She has been on 300mg  gabapentin at night and 100mg  of amitriptyline. She was taking 150mg  but self titrated back to 100mg . She had EMGs which appeared to be normal. She would like to go up on her gabapentin. No other concerns or complaints at this time.   Relevant past medical, surgical, family and social history reviewed  and updated as indicated. Interim medical history since our last visit reviewed. Allergies and medications reviewed and updated.  Review of Systems  Constitutional: Negative.   HENT: Negative.    Respiratory: Negative.    Cardiovascular: Negative.   Musculoskeletal: Negative.   Skin: Negative.   Neurological:  Positive for dizziness and numbness. Negative for tremors, seizures, syncope, facial asymmetry, speech difficulty, weakness, light-headedness and headaches.  Psychiatric/Behavioral:  Positive for dysphoric mood. Negative for agitation, behavioral problems, confusion, decreased concentration, hallucinations, self-injury, sleep disturbance and suicidal ideas. The patient is nervous/anxious. The patient is not hyperactive.     Per HPI unless specifically indicated above     Objective:    LMP  (LMP Unknown)   Wt Readings from Last 3 Encounters:  07/11/21 198 lb 9.6 oz (90.1 kg)  07/08/21 187 lb (84.8 kg)  05/22/21 189 lb (85.7 kg)    Physical Exam Vitals and nursing note reviewed.  Constitutional:      General: She is not in acute distress.    Appearance: Normal appearance. She is not ill-appearing, toxic-appearing or diaphoretic.  HENT:     Head: Normocephalic and atraumatic.     Right Ear: External ear normal.     Left Ear: External ear normal.     Nose: Nose normal.     Mouth/Throat:     Mouth: Mucous membranes are moist.     Pharynx: Oropharynx is clear.  Eyes:     General: No scleral icterus.       Right eye: No discharge.        Left eye: No discharge.     Extraocular Movements: Extraocular movements intact.     Conjunctiva/sclera: Conjunctivae normal.     Pupils: Pupils are equal, round, and reactive to light.  Cardiovascular:     Rate and Rhythm: Normal rate and regular rhythm.     Pulses: Normal pulses.     Heart sounds: Normal heart sounds. No murmur heard.    No friction rub. No gallop.  Pulmonary:     Effort: Pulmonary effort is normal. No respiratory  distress.     Breath sounds: Normal breath sounds. No stridor. No wheezing, rhonchi or rales.  Chest:     Chest wall: No tenderness.  Musculoskeletal:        General: Normal range of motion.     Cervical back: Normal range of motion and neck supple.  Skin:    General: Skin is warm and dry.     Capillary Refill: Capillary refill takes less than 2 seconds.     Coloration: Skin is not jaundiced or pale.     Findings: No bruising, erythema, lesion or rash.  Neurological:     General: No focal deficit present.     Mental Status: She is alert and oriented to person, place, and time. Mental status is at baseline.  Psychiatric:        Mood and Affect: Mood normal.        Behavior: Behavior normal.        Thought Content: Thought content normal.  Judgment: Judgment normal.    Results for orders placed or performed during the hospital encounter of 07/08/21  Protime-INR  Result Value Ref Range   Prothrombin Time 12.6 11.4 - 15.2 seconds   INR 1.0 0.8 - 1.2  APTT  Result Value Ref Range   aPTT 30 24 - 36 seconds  CBC  Result Value Ref Range   WBC 7.4 4.0 - 10.5 K/uL   RBC 4.68 3.87 - 5.11 MIL/uL   Hemoglobin 14.1 12.0 - 15.0 g/dL   HCT 43.6 36.0 - 46.0 %   MCV 93.2 80.0 - 100.0 fL   MCH 30.1 26.0 - 34.0 pg   MCHC 32.3 30.0 - 36.0 g/dL   RDW 12.9 11.5 - 15.5 %   Platelets 297 150 - 400 K/uL   nRBC 0.0 0.0 - 0.2 %  Differential  Result Value Ref Range   Neutrophils Relative % 59 %   Neutro Abs 4.3 1.7 - 7.7 K/uL   Lymphocytes Relative 30 %   Lymphs Abs 2.2 0.7 - 4.0 K/uL   Monocytes Relative 7 %   Monocytes Absolute 0.5 0.1 - 1.0 K/uL   Eosinophils Relative 3 %   Eosinophils Absolute 0.2 0.0 - 0.5 K/uL   Basophils Relative 1 %   Basophils Absolute 0.1 0.0 - 0.1 K/uL   Immature Granulocytes 0 %   Abs Immature Granulocytes 0.02 0.00 - 0.07 K/uL  Comprehensive metabolic panel  Result Value Ref Range   Sodium 139 135 - 145 mmol/L   Potassium 4.2 3.5 - 5.1 mmol/L    Chloride 104 98 - 111 mmol/L   CO2 29 22 - 32 mmol/L   Glucose, Bld 71 70 - 99 mg/dL   BUN 18 6 - 20 mg/dL   Creatinine, Ser 1.01 (H) 0.44 - 1.00 mg/dL   Calcium 9.7 8.9 - 10.3 mg/dL   Total Protein 7.0 6.5 - 8.1 g/dL   Albumin 4.4 3.5 - 5.0 g/dL   AST 19 15 - 41 U/L   ALT 16 0 - 44 U/L   Alkaline Phosphatase 43 38 - 126 U/L   Total Bilirubin 0.6 0.3 - 1.2 mg/dL   GFR, Estimated >60 >60 mL/min   Anion gap 6 5 - 15  Ethanol  Result Value Ref Range   Alcohol, Ethyl (B) <10 <10 mg/dL  Urinalysis, Complete w Microscopic  Result Value Ref Range   Color, Urine YELLOW (A) YELLOW   APPearance CLEAR (A) CLEAR   Specific Gravity, Urine 1.009 1.005 - 1.030   pH 5.0 5.0 - 8.0   Glucose, UA NEGATIVE NEGATIVE mg/dL   Hgb urine dipstick NEGATIVE NEGATIVE   Bilirubin Urine NEGATIVE NEGATIVE   Ketones, ur NEGATIVE NEGATIVE mg/dL   Protein, ur NEGATIVE NEGATIVE mg/dL   Nitrite NEGATIVE NEGATIVE   Leukocytes,Ua NEGATIVE NEGATIVE   RBC / HPF 0-5 0 - 5 RBC/hpf   WBC, UA 0-5 0 - 5 WBC/hpf   Bacteria, UA NONE SEEN NONE SEEN   Squamous Epithelial / LPF 0-5 0 - 5   Mucus PRESENT       Assessment & Plan:   Problem List Items Addressed This Visit   None    Follow up plan: No follow-ups on file.

## 2021-10-22 ENCOUNTER — Ambulatory Visit: Payer: 59 | Admitting: Orthopaedic Surgery

## 2021-11-12 ENCOUNTER — Ambulatory Visit: Payer: 59 | Admitting: Neurology

## 2021-11-12 ENCOUNTER — Ambulatory Visit: Payer: 59 | Admitting: Orthopaedic Surgery

## 2021-11-15 ENCOUNTER — Telehealth: Payer: Self-pay

## 2021-11-15 NOTE — Telephone Encounter (Signed)
Transition Care Management Unsuccessful Follow-up Telephone Call  Date of discharge and from where:  11/14/21, [facility]  Attempts:  1st Attempt  Reason for unsuccessful TCM follow-up call:  Left voice message

## 2021-11-18 NOTE — Telephone Encounter (Signed)
Transition Care Management Follow-up Telephone Call Date of discharge and from where: 11/14/21, [facility] How have you been since you were released from the hospital? A little better.  Any questions or concerns? No  Items Reviewed: Did the pt receive and understand the discharge instructions provided? Yes  Medications obtained and verified? Yes  Other? No  Any new allergies since your discharge? No  Dietary orders reviewed? Yes Do you have support at home? No   Home Care and Equipment/Supplies: Were home health services ordered? not applicable If so, what is the name of the agency? N/A  Has the agency set up a time to come to the patient's home? no Were any new equipment or medical supplies ordered?  Yes: , O2 tanks What is the name of the medical supply agency? [facility] Were you able to get the supplies/equipment? yes Do you have any questions related to the use of the equipment or supplies? No  Functional Questionnaire: (I = Independent and D = Dependent) ADLs: I  Bathing/Dressing- I  Meal Prep- I  Eating- I  Maintaining continence- I  Transferring/Ambulation- I  Managing Meds- I  Follow up appointments reviewed:  PCP Hospital f/u appt confirmed? No , in between PCP's. Has appointment to establish with [facility] on 11/27/21 due to insurance reasons.  [facility] f/u appt confirmed? No   Are transportation arrangements needed? No  If their condition worsens, is the pt aware to call PCP or go to the Emergency Dept.? Yes Was the patient provided with contact information for the PCP's office or ED? Yes Was to pt encouraged to call back with questions or concerns? Yes

## 2021-11-25 ENCOUNTER — Other Ambulatory Visit: Payer: Self-pay | Admitting: Nurse Practitioner

## 2021-11-25 NOTE — Telephone Encounter (Unsigned)
Copied from [place] [id number]. Topic: General - Other >> Nov 25, 2021 12:18 PM Everette C wrote: Reason for CRM: Medication Refill - Medication: amitriptyline (ELAVIL) 150 MG tablet [[id number]]  Has the patient contacted their pharmacy? Yes.   (Agent: If no, request that the patient contact the pharmacy for the refill. If patient does not wish to contact the pharmacy document the reason why and proceed with request.) (Agent: If yes, when and what did the pharmacy advise?)  Preferred Pharmacy (with phone number or street name): [id number] [address], [place] [address] Phone: [phone] Fax: [phone] Hours: Not open 24 hours   Has the patient been seen for an appointment in the last year OR does the patient have an upcoming appointment? Yes.    Agent: Please be advised that RX refills may take up to 3 business days. We ask that you follow-up with your pharmacy.

## 2021-11-26 MED ORDER — AMITRIPTYLINE HCL 150 MG PO TABS: ORAL_TABLET | ORAL | 0 refills | Status: DC

## 2021-11-26 NOTE — Telephone Encounter (Signed)
Requested Prescriptions  Pending Prescriptions Disp Refills   amitriptyline (ELAVIL) 150 MG tablet 30 tablet 0    Sig: TAKE 1 TABLET(100 MG) BY MOUTH AT BEDTIME     Psychiatry:  Antidepressants - Heterocyclics (TCAs) Passed - 11/25/2021  2:20 PM      Passed - Completed PHQ-2 or PHQ-9 in the last 360 days      Passed - Valid encounter within last 6 months    Recent Outpatient Visits           2 months ago Acute non-recurrent sinusitis, unspecified location   [facility] Jon Billings, NP   4 months ago Facial paresthesia   [place], Megan P, DO   7 months ago Upper respiratory tract infection, unspecified type   [facility] [name], [name], MD   8 months ago Tobacco use disorder   [facility] [name], [name], MD   8 months ago GAD (generalized anxiety disorder)   [facility] [name], [name], MD

## 2021-12-02 ENCOUNTER — Other Ambulatory Visit (HOSPITAL_COMMUNITY): Payer: Self-pay

## 2021-12-04 ENCOUNTER — Other Ambulatory Visit (HOSPITAL_COMMUNITY): Payer: Self-pay

## 2021-12-04 ENCOUNTER — Telehealth: Payer: Self-pay

## 2021-12-04 NOTE — Telephone Encounter (Signed)
Prior auth not needed, pt picked up med on 11/15/21. Insurance will only allow 1 inhaler every 23 days.

## 2021-12-04 NOTE — Telephone Encounter (Signed)
Patient Advocate Encounter   Received notification from [facility] that prior authorization for Stiolto Respimat inhaler is required.   PA submitted on 12/04/2021 Key [id number] Status is pending       Kristie Cowman, CPHT Pharmacy Patient Advocate Specialist [facility] Patient Advocate Team Direct Number: [phone] Fax: [phone]

## 2021-12-27 ENCOUNTER — Encounter: Payer: Self-pay | Admitting: Orthopaedic Surgery

## 2021-12-27 ENCOUNTER — Ambulatory Visit: Payer: BLUE CROSS/BLUE SHIELD | Admitting: Orthopaedic Surgery

## 2021-12-27 VITALS — BP 112/74 | HR 85

## 2021-12-27 DIAGNOSIS — M7061 Trochanteric bursitis, right hip: Secondary | ICD-10-CM | POA: Diagnosis not present

## 2021-12-27 NOTE — Progress Notes (Unsigned)
Office Visit Note   Patient: Gwendolyn Hansen           Date of Birth: 06/01/1970           MRN: [id number] Visit Date: 12/27/2021              Requested by: Larae Grooms, NP [address] [place],  [place] [address] PCP: Larae Grooms, NP   Assessment & Plan: Visit Diagnoses:  1. Trochanteric bursitis, right hip     Plan: Patient's previous MRI in June lumbar showed some foraminal stenosis on the right subarticular recess narrowing at L5-S1.  Injection performed to the trochanter with excellent quick relief postinjection.  We discussed trying to find some things to help with stress reduction such as meditation or core strengthening, riding exercise bike etc.  We discussed avoiding things that could be addictive or distracted.  Follow-up as needed.  Follow-Up Instructions: No follow-ups on file.   Orders:  No orders of the defined types were placed in this encounter.  No orders of the defined types were placed in this encounter.     Procedures: Large Joint Inj: R greater trochanter on 12/27/2021 2:50 PM Details: lateral approach Medications: 1 mL lidocaine 1 %; 2 mL bupivacaine 0.25 %; 40 mg methylPREDNISolone acetate 40 MG/ML      Clinical Data: No additional findings.   Subjective: Chief Complaint  Patient presents with   Right Leg - Pain    HPI 51 year old female returns with ongoing problems with pain in the right hip region radiating down toward her leg stopping about her knee most the time.  Joint was [place] walked up and down stairs with increased pain.  She describes the pain as stabbing difficulty sleeping cannot roll on her right side.  Problems trying to walk long distance.  She is taking gabapentin with some improvement.  Her fianc passed away 11-10-2021 and she is having difficulty dealing with this.  Staying with her sister and reports some conflict.  Past history of depression, medication and alcohol problems.  She states principal pain is  right now over right lateral hip region.  Review of Systems all systems noncontributory to HPI.   Objective: Vital Signs: BP 112/74   Pulse 85   LMP  (LMP Unknown)   Physical Exam Constitutional:      Appearance: She is well-developed.  HENT:     Head: Normocephalic.     Right Ear: External ear normal.     Left Ear: External ear normal. There is no impacted cerumen.  Eyes:     Pupils: Pupils are equal, round, and reactive to light.  Neck:     Thyroid: No thyromegaly.     Trachea: No tracheal deviation.  Cardiovascular:     Rate and Rhythm: Normal rate.  Pulmonary:     Effort: Pulmonary effort is normal.  Abdominal:     Palpations: Abdomen is soft.  Musculoskeletal:     Cervical back: No rigidity.  Skin:    General: Skin is warm and dry.  Neurological:     Mental Status: She is alert and oriented to person, place, and time.  Psychiatric:        Behavior: Behavior normal.     Ortho Exam patient has significant tenderness over the right greater trochanteric bursa.  Negative logroll to hips knees reach full extension knee and ankle jerk are intact opposite left hip is nontender.  No sciatic notch tenderness.  Specialty Comments:  No specialty comments available.  Imaging: No results found.   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   Amphetamine use disorder, 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   Tobacco use disorder 08/18/2014   Neck pain 08/17/2014   GAD  (generalized anxiety disorder) 08/17/2014   Depression 08/17/2014   Opioid use disorder, severe, in controlled environment, dependence ([place]) 08/17/2014   History of alcohol dependence ([place]) 08/17/2014   Past Medical History:  Diagnosis Date   Allergy    Anxiety    Asthma     Family History  Problem Relation Age of Onset   COPD Mother    Anxiety disorder Mother    Emphysema Father    Alcohol abuse Father    Anxiety disorder Father    Leukemia Father    Anxiety disorder Sister    Bipolar disorder Sister    Colon cancer Maternal Grandmother     Past Surgical History:  Procedure Laterality Date   ANKLE SURGERY Right [place] SURGERY  2011   Social History   Occupational History   Not on file  Tobacco Use   Smoking status: Every Day    Packs/day: 2.00    Years: 28.00    Total pack years: 56.00    Types: Cigarettes   Smokeless tobacco: Never   Tobacco comments:    1ppd-11/13/2020  Vaping Use   Vaping Use: Some days   Substances: Nicotine, Flavoring  Substance and Sexual Activity   Alcohol use: No    Alcohol/week: 0.0 standard drinks of alcohol    Comment: quit drinking in 2010   Drug use: No   Sexual activity: Yes

## 2021-12-30 MED ORDER — METHYLPREDNISOLONE ACETATE 40 MG/ML IJ SUSP
40.0000 mg | INTRAMUSCULAR | Status: AC | PRN
  Administered 2021-12-27: 40 mg via INTRA_ARTICULAR

## 2021-12-30 MED ORDER — LIDOCAINE HCL 1 % IJ SOLN
1.0000 mL | INTRAMUSCULAR | Status: AC | PRN
  Administered 2021-12-27: 1 mL

## 2021-12-30 MED ORDER — BUPIVACAINE HCL 0.25 % IJ SOLN
2.0000 mL | INTRAMUSCULAR | Status: AC | PRN
  Administered 2021-12-27: 2 mL via INTRA_ARTICULAR

## 2022-01-20 DEATH — deceased
